# Patient Record
Sex: Female | Born: 1985
Health system: Southern US, Community
[De-identification: ages and names within clinical notes are randomized; demographics above are authoritative.]

## PROBLEM LIST (undated history)

## (undated) DIAGNOSIS — Z8619 Personal history of other infectious and parasitic diseases: Secondary | ICD-10-CM

## (undated) DIAGNOSIS — T7840XA Allergy, unspecified, initial encounter: Secondary | ICD-10-CM

## (undated) DIAGNOSIS — D649 Anemia, unspecified: Secondary | ICD-10-CM

## (undated) DIAGNOSIS — K921 Melena: Secondary | ICD-10-CM

## (undated) DIAGNOSIS — K219 Gastro-esophageal reflux disease without esophagitis: Secondary | ICD-10-CM

## (undated) DIAGNOSIS — J45909 Unspecified asthma, uncomplicated: Secondary | ICD-10-CM

## (undated) HISTORY — DX: Allergy, unspecified, initial encounter: T78.40XA

## (undated) HISTORY — PX: WISDOM TOOTH EXTRACTION: SHX21

## (undated) HISTORY — PX: OTHER SURGICAL HISTORY: SHX169

## (undated) HISTORY — DX: Unspecified asthma, uncomplicated: J45.909

## (undated) HISTORY — DX: Gastro-esophageal reflux disease without esophagitis: K21.9

## (undated) HISTORY — DX: Melena: K92.1

## (undated) HISTORY — DX: Personal history of other infectious and parasitic diseases: Z86.19

## (undated) HISTORY — DX: Anemia, unspecified: D64.9

---

## 2016-02-18 LAB — HM COLONOSCOPY

## 2018-08-12 DIAGNOSIS — Z6824 Body mass index (BMI) 24.0-24.9, adult: Secondary | ICD-10-CM | POA: Diagnosis not present

## 2018-08-12 DIAGNOSIS — Z01419 Encounter for gynecological examination (general) (routine) without abnormal findings: Secondary | ICD-10-CM | POA: Diagnosis not present

## 2018-08-17 LAB — HM PAP SMEAR

## 2018-09-14 DIAGNOSIS — Z23 Encounter for immunization: Secondary | ICD-10-CM | POA: Diagnosis not present

## 2018-10-01 DIAGNOSIS — M9901 Segmental and somatic dysfunction of cervical region: Secondary | ICD-10-CM | POA: Diagnosis not present

## 2018-10-01 DIAGNOSIS — M62838 Other muscle spasm: Secondary | ICD-10-CM | POA: Diagnosis not present

## 2018-10-01 DIAGNOSIS — M9903 Segmental and somatic dysfunction of lumbar region: Secondary | ICD-10-CM | POA: Diagnosis not present

## 2018-10-01 DIAGNOSIS — M461 Sacroiliitis, not elsewhere classified: Secondary | ICD-10-CM | POA: Diagnosis not present

## 2018-10-02 ENCOUNTER — Other Ambulatory Visit: Payer: Self-pay | Admitting: Chiropractic Medicine

## 2018-10-02 ENCOUNTER — Ambulatory Visit
Admission: RE | Admit: 2018-10-02 | Discharge: 2018-10-02 | Disposition: A | Discharge status: Home / Self Care | Payer: BLUE CROSS/BLUE SHIELD | Source: Ambulatory Visit | Attending: Chiropractic Medicine | Admitting: Chiropractic Medicine

## 2018-10-02 DIAGNOSIS — M25561 Pain in right knee: Secondary | ICD-10-CM | POA: Diagnosis not present

## 2018-10-07 DIAGNOSIS — M461 Sacroiliitis, not elsewhere classified: Secondary | ICD-10-CM | POA: Diagnosis not present

## 2018-10-07 DIAGNOSIS — M9903 Segmental and somatic dysfunction of lumbar region: Secondary | ICD-10-CM | POA: Diagnosis not present

## 2018-10-07 DIAGNOSIS — M9901 Segmental and somatic dysfunction of cervical region: Secondary | ICD-10-CM | POA: Diagnosis not present

## 2018-10-07 DIAGNOSIS — M62838 Other muscle spasm: Secondary | ICD-10-CM | POA: Diagnosis not present

## 2018-10-14 DIAGNOSIS — M9903 Segmental and somatic dysfunction of lumbar region: Secondary | ICD-10-CM | POA: Diagnosis not present

## 2018-10-14 DIAGNOSIS — M9901 Segmental and somatic dysfunction of cervical region: Secondary | ICD-10-CM | POA: Diagnosis not present

## 2018-10-14 DIAGNOSIS — M62838 Other muscle spasm: Secondary | ICD-10-CM | POA: Diagnosis not present

## 2018-10-14 DIAGNOSIS — M461 Sacroiliitis, not elsewhere classified: Secondary | ICD-10-CM | POA: Diagnosis not present

## 2018-10-23 DIAGNOSIS — M62838 Other muscle spasm: Secondary | ICD-10-CM | POA: Diagnosis not present

## 2018-10-23 DIAGNOSIS — M9901 Segmental and somatic dysfunction of cervical region: Secondary | ICD-10-CM | POA: Diagnosis not present

## 2018-10-23 DIAGNOSIS — M9903 Segmental and somatic dysfunction of lumbar region: Secondary | ICD-10-CM | POA: Diagnosis not present

## 2018-10-23 DIAGNOSIS — M461 Sacroiliitis, not elsewhere classified: Secondary | ICD-10-CM | POA: Diagnosis not present

## 2018-11-04 DIAGNOSIS — M533 Sacrococcygeal disorders, not elsewhere classified: Secondary | ICD-10-CM | POA: Diagnosis not present

## 2018-11-04 DIAGNOSIS — M6281 Muscle weakness (generalized): Secondary | ICD-10-CM | POA: Diagnosis not present

## 2018-11-04 DIAGNOSIS — N939 Abnormal uterine and vaginal bleeding, unspecified: Secondary | ICD-10-CM | POA: Diagnosis not present

## 2018-11-05 DIAGNOSIS — M9901 Segmental and somatic dysfunction of cervical region: Secondary | ICD-10-CM | POA: Diagnosis not present

## 2018-11-05 DIAGNOSIS — M461 Sacroiliitis, not elsewhere classified: Secondary | ICD-10-CM | POA: Diagnosis not present

## 2018-11-05 DIAGNOSIS — M62838 Other muscle spasm: Secondary | ICD-10-CM | POA: Diagnosis not present

## 2018-11-05 DIAGNOSIS — M9903 Segmental and somatic dysfunction of lumbar region: Secondary | ICD-10-CM | POA: Diagnosis not present

## 2018-11-11 DIAGNOSIS — M6281 Muscle weakness (generalized): Secondary | ICD-10-CM | POA: Diagnosis not present

## 2018-11-11 DIAGNOSIS — M62838 Other muscle spasm: Secondary | ICD-10-CM | POA: Diagnosis not present

## 2018-11-11 DIAGNOSIS — K5900 Constipation, unspecified: Secondary | ICD-10-CM | POA: Diagnosis not present

## 2018-11-11 DIAGNOSIS — M25561 Pain in right knee: Secondary | ICD-10-CM | POA: Diagnosis not present

## 2018-11-27 DIAGNOSIS — M461 Sacroiliitis, not elsewhere classified: Secondary | ICD-10-CM | POA: Diagnosis not present

## 2018-11-27 DIAGNOSIS — M9903 Segmental and somatic dysfunction of lumbar region: Secondary | ICD-10-CM | POA: Diagnosis not present

## 2018-11-27 DIAGNOSIS — M9901 Segmental and somatic dysfunction of cervical region: Secondary | ICD-10-CM | POA: Diagnosis not present

## 2018-11-27 DIAGNOSIS — M62838 Other muscle spasm: Secondary | ICD-10-CM | POA: Diagnosis not present

## 2018-11-30 DIAGNOSIS — M25561 Pain in right knee: Secondary | ICD-10-CM | POA: Diagnosis not present

## 2018-11-30 DIAGNOSIS — M6281 Muscle weakness (generalized): Secondary | ICD-10-CM | POA: Diagnosis not present

## 2018-11-30 DIAGNOSIS — M62838 Other muscle spasm: Secondary | ICD-10-CM | POA: Diagnosis not present

## 2018-11-30 DIAGNOSIS — K5900 Constipation, unspecified: Secondary | ICD-10-CM | POA: Diagnosis not present

## 2018-12-07 DIAGNOSIS — M25561 Pain in right knee: Secondary | ICD-10-CM | POA: Diagnosis not present

## 2018-12-07 DIAGNOSIS — K5900 Constipation, unspecified: Secondary | ICD-10-CM | POA: Diagnosis not present

## 2018-12-07 DIAGNOSIS — M62838 Other muscle spasm: Secondary | ICD-10-CM | POA: Diagnosis not present

## 2018-12-07 DIAGNOSIS — M6281 Muscle weakness (generalized): Secondary | ICD-10-CM | POA: Diagnosis not present

## 2018-12-09 DIAGNOSIS — M9903 Segmental and somatic dysfunction of lumbar region: Secondary | ICD-10-CM | POA: Diagnosis not present

## 2018-12-09 DIAGNOSIS — M62838 Other muscle spasm: Secondary | ICD-10-CM | POA: Diagnosis not present

## 2018-12-09 DIAGNOSIS — M461 Sacroiliitis, not elsewhere classified: Secondary | ICD-10-CM | POA: Diagnosis not present

## 2018-12-09 DIAGNOSIS — M9901 Segmental and somatic dysfunction of cervical region: Secondary | ICD-10-CM | POA: Diagnosis not present

## 2018-12-21 DIAGNOSIS — M25561 Pain in right knee: Secondary | ICD-10-CM | POA: Diagnosis not present

## 2018-12-21 DIAGNOSIS — M6281 Muscle weakness (generalized): Secondary | ICD-10-CM | POA: Diagnosis not present

## 2018-12-21 DIAGNOSIS — K5900 Constipation, unspecified: Secondary | ICD-10-CM | POA: Diagnosis not present

## 2018-12-21 DIAGNOSIS — M62838 Other muscle spasm: Secondary | ICD-10-CM | POA: Diagnosis not present

## 2018-12-23 NOTE — L&D Delivery Note (Signed)
Delivery Note  SVD viable female Apgars 9,9 over 2nd degree ML lac.  Placenta delivered spontaneously intact with 3VC. Repair with 2-0 Chromic with good support and hemostasis noted.  R/V exam confirms.  PH art was not sent.   Mother and baby to couplet care and are doing well.  EBL 150cc  Louretta Shorten, MD

## 2018-12-28 DIAGNOSIS — K5900 Constipation, unspecified: Secondary | ICD-10-CM | POA: Diagnosis not present

## 2018-12-28 DIAGNOSIS — M6281 Muscle weakness (generalized): Secondary | ICD-10-CM | POA: Diagnosis not present

## 2018-12-28 DIAGNOSIS — M62838 Other muscle spasm: Secondary | ICD-10-CM | POA: Diagnosis not present

## 2018-12-28 DIAGNOSIS — M25561 Pain in right knee: Secondary | ICD-10-CM | POA: Diagnosis not present

## 2019-01-04 DIAGNOSIS — M62838 Other muscle spasm: Secondary | ICD-10-CM | POA: Diagnosis not present

## 2019-01-04 DIAGNOSIS — K5900 Constipation, unspecified: Secondary | ICD-10-CM | POA: Diagnosis not present

## 2019-01-04 DIAGNOSIS — M6281 Muscle weakness (generalized): Secondary | ICD-10-CM | POA: Diagnosis not present

## 2019-01-04 DIAGNOSIS — M25561 Pain in right knee: Secondary | ICD-10-CM | POA: Diagnosis not present

## 2019-01-25 DIAGNOSIS — M6281 Muscle weakness (generalized): Secondary | ICD-10-CM | POA: Diagnosis not present

## 2019-01-25 DIAGNOSIS — M25561 Pain in right knee: Secondary | ICD-10-CM | POA: Diagnosis not present

## 2019-01-25 DIAGNOSIS — K5900 Constipation, unspecified: Secondary | ICD-10-CM | POA: Diagnosis not present

## 2019-01-25 DIAGNOSIS — M62838 Other muscle spasm: Secondary | ICD-10-CM | POA: Diagnosis not present

## 2019-01-28 DIAGNOSIS — N911 Secondary amenorrhea: Secondary | ICD-10-CM | POA: Diagnosis not present

## 2019-02-04 DIAGNOSIS — Z3481 Encounter for supervision of other normal pregnancy, first trimester: Secondary | ICD-10-CM | POA: Diagnosis not present

## 2019-02-04 DIAGNOSIS — Z315 Encounter for genetic counseling: Secondary | ICD-10-CM | POA: Diagnosis not present

## 2019-02-04 DIAGNOSIS — Z319 Encounter for procreative management, unspecified: Secondary | ICD-10-CM | POA: Diagnosis not present

## 2019-02-04 DIAGNOSIS — Z3685 Encounter for antenatal screening for Streptococcus B: Secondary | ICD-10-CM | POA: Diagnosis not present

## 2019-02-04 LAB — OB RESULTS CONSOLE GC/CHLAMYDIA
Chlamydia: NEGATIVE
Gonorrhea: NEGATIVE

## 2019-02-04 LAB — OB RESULTS CONSOLE HIV ANTIBODY (ROUTINE TESTING): HIV: NONREACTIVE

## 2019-02-04 LAB — OB RESULTS CONSOLE ANTIBODY SCREEN: Antibody Screen: NEGATIVE

## 2019-02-04 LAB — OB RESULTS CONSOLE HEPATITIS B SURFACE ANTIGEN: Hepatitis B Surface Ag: NEGATIVE

## 2019-02-04 LAB — OB RESULTS CONSOLE RPR: RPR: NONREACTIVE

## 2019-02-04 LAB — OB RESULTS CONSOLE ABO/RH: RH Type: POSITIVE

## 2019-02-04 LAB — OB RESULTS CONSOLE RUBELLA ANTIBODY, IGM: Rubella: NON-IMMUNE/NOT IMMUNE

## 2019-02-06 DIAGNOSIS — J4521 Mild intermittent asthma with (acute) exacerbation: Secondary | ICD-10-CM | POA: Diagnosis not present

## 2019-02-08 DIAGNOSIS — M6281 Muscle weakness (generalized): Secondary | ICD-10-CM | POA: Diagnosis not present

## 2019-02-08 DIAGNOSIS — M25561 Pain in right knee: Secondary | ICD-10-CM | POA: Diagnosis not present

## 2019-02-08 DIAGNOSIS — M62838 Other muscle spasm: Secondary | ICD-10-CM | POA: Diagnosis not present

## 2019-02-08 DIAGNOSIS — K5900 Constipation, unspecified: Secondary | ICD-10-CM | POA: Diagnosis not present

## 2019-02-09 DIAGNOSIS — J189 Pneumonia, unspecified organism: Secondary | ICD-10-CM | POA: Diagnosis not present

## 2019-02-17 ENCOUNTER — Other Ambulatory Visit: Payer: Self-pay

## 2019-02-17 ENCOUNTER — Ambulatory Visit: Payer: BLUE CROSS/BLUE SHIELD | Admitting: Family Medicine

## 2019-02-17 ENCOUNTER — Encounter: Payer: Self-pay | Admitting: Family Medicine

## 2019-02-17 VITALS — BP 110/70 | HR 70 | Temp 98.2°F | Resp 16 | Ht 66.5 in | Wt 150.2 lb

## 2019-02-17 DIAGNOSIS — Z3491 Encounter for supervision of normal pregnancy, unspecified, first trimester: Secondary | ICD-10-CM

## 2019-02-17 DIAGNOSIS — J189 Pneumonia, unspecified organism: Secondary | ICD-10-CM

## 2019-02-17 NOTE — Progress Notes (Signed)
   Subjective:    Patient ID: Lauren Small, female    DOB: 1986/12/22, 33 y.o.   MRN: [id number]  HPI New to establish.  Recently moved to area.  PNA- went to UC on 2/15 and was given Zpack for bronchitis.  No improvement and went to 2nd UC and was dx'd w/ presumptive PNA.  No CXR due to pregnancy.  Currently on Augmentin and Zpack.  Daughter had PNA 3 weeks prior.  Feeling better today.  Continues to have cough.  No fevers.  + intermittent chills.  Some pain w/ cough.  + fatigue.  Pregnancy- following w/ Dr Velvet Bathe.  Due date is 09/16/19   Review of Systems For ROS see HPI     Objective:   Physical Exam Vitals signs reviewed.  Constitutional:      General: She is not in acute distress.    Appearance: She is well-developed.  HENT:     Head: Normocephalic and atraumatic.     Right Ear: Tympanic membrane normal.     Left Ear: Tympanic membrane normal.     Nose: Mucosal edema and rhinorrhea present.     Right Sinus: No maxillary sinus tenderness or frontal sinus tenderness.     Left Sinus: No maxillary sinus tenderness or frontal sinus tenderness.     Mouth/Throat:     Pharynx: Posterior oropharyngeal erythema (w/ PND) present.  Eyes:     Conjunctiva/sclera: Conjunctivae normal.     Pupils: Pupils are equal, round, and reactive to light.  Neck:     Musculoskeletal: Normal range of motion and neck supple.  Cardiovascular:     Rate and Rhythm: Normal rate and regular rhythm.     Heart sounds: Normal heart sounds.  Pulmonary:     Effort: Pulmonary effort is normal. No respiratory distress.     Breath sounds: Normal breath sounds. No wheezing or rales.  Lymphadenopathy:     Cervical: No cervical adenopathy.  Neurological:     General: No focal deficit present.     Mental Status: She is oriented to person, place, and time.  Psychiatric:        Mood and Affect: Mood normal.        Behavior: Behavior normal.        Thought Content: Thought content normal.             Assessment & Plan:  Pneumonia- new.  Pt is finishing abx and improving.  No longer febrile but continues to cough.  Lungs are CTAB.  Told her the cough could persist for weeks and that this is normal.  Continue OTC cough meds, albuterol prn.  Reviewed supportive care and red flags that should prompt return.  Pt expressed understanding and is in agreement w/ plan.   Pregnancy- following w/ Dr Velvet Bathe.  Doing well so far.  Will follow along and treat medical things as they arise.

## 2019-02-17 NOTE — Patient Instructions (Signed)
Schedule your complete physical for after delivery We're here if you need [place] for anything that may come up Continue your allergy meds and antibiotics as directed Cough syrup as needed Call with any questions or concerns Welcome!  We're glad to have you! CONGRATS!!!

## 2019-02-23 DIAGNOSIS — N760 Acute vaginitis: Secondary | ICD-10-CM | POA: Diagnosis not present

## 2019-02-23 DIAGNOSIS — Z113 Encounter for screening for infections with a predominantly sexual mode of transmission: Secondary | ICD-10-CM | POA: Diagnosis not present

## 2019-02-23 DIAGNOSIS — Z3491 Encounter for supervision of normal pregnancy, unspecified, first trimester: Secondary | ICD-10-CM | POA: Diagnosis not present

## 2019-02-24 ENCOUNTER — Ambulatory Visit: Payer: BLUE CROSS/BLUE SHIELD | Admitting: Family Medicine

## 2019-03-01 DIAGNOSIS — M62838 Other muscle spasm: Secondary | ICD-10-CM | POA: Diagnosis not present

## 2019-03-01 DIAGNOSIS — M6281 Muscle weakness (generalized): Secondary | ICD-10-CM | POA: Diagnosis not present

## 2019-03-01 DIAGNOSIS — M25561 Pain in right knee: Secondary | ICD-10-CM | POA: Diagnosis not present

## 2019-03-01 DIAGNOSIS — K5900 Constipation, unspecified: Secondary | ICD-10-CM | POA: Diagnosis not present

## 2019-03-08 DIAGNOSIS — Z3A12 12 weeks gestation of pregnancy: Secondary | ICD-10-CM | POA: Diagnosis not present

## 2019-03-08 DIAGNOSIS — Z3682 Encounter for antenatal screening for nuchal translucency: Secondary | ICD-10-CM | POA: Diagnosis not present

## 2019-03-22 DIAGNOSIS — M25561 Pain in right knee: Secondary | ICD-10-CM | POA: Diagnosis not present

## 2019-03-22 DIAGNOSIS — K5900 Constipation, unspecified: Secondary | ICD-10-CM | POA: Diagnosis not present

## 2019-03-22 DIAGNOSIS — M62838 Other muscle spasm: Secondary | ICD-10-CM | POA: Diagnosis not present

## 2019-03-22 DIAGNOSIS — M6281 Muscle weakness (generalized): Secondary | ICD-10-CM | POA: Diagnosis not present

## 2019-03-30 DIAGNOSIS — K5900 Constipation, unspecified: Secondary | ICD-10-CM | POA: Diagnosis not present

## 2019-03-30 DIAGNOSIS — M62838 Other muscle spasm: Secondary | ICD-10-CM | POA: Diagnosis not present

## 2019-03-30 DIAGNOSIS — M25561 Pain in right knee: Secondary | ICD-10-CM | POA: Diagnosis not present

## 2019-03-30 DIAGNOSIS — M6281 Muscle weakness (generalized): Secondary | ICD-10-CM | POA: Diagnosis not present

## 2019-04-05 DIAGNOSIS — M6281 Muscle weakness (generalized): Secondary | ICD-10-CM | POA: Diagnosis not present

## 2019-04-05 DIAGNOSIS — M25561 Pain in right knee: Secondary | ICD-10-CM | POA: Diagnosis not present

## 2019-04-05 DIAGNOSIS — M62838 Other muscle spasm: Secondary | ICD-10-CM | POA: Diagnosis not present

## 2019-04-05 DIAGNOSIS — K5900 Constipation, unspecified: Secondary | ICD-10-CM | POA: Diagnosis not present

## 2019-04-12 DIAGNOSIS — M62838 Other muscle spasm: Secondary | ICD-10-CM | POA: Diagnosis not present

## 2019-04-12 DIAGNOSIS — K5900 Constipation, unspecified: Secondary | ICD-10-CM | POA: Diagnosis not present

## 2019-04-12 DIAGNOSIS — M25561 Pain in right knee: Secondary | ICD-10-CM | POA: Diagnosis not present

## 2019-04-12 DIAGNOSIS — M6281 Muscle weakness (generalized): Secondary | ICD-10-CM | POA: Diagnosis not present

## 2019-04-26 DIAGNOSIS — Z363 Encounter for antenatal screening for malformations: Secondary | ICD-10-CM | POA: Diagnosis not present

## 2019-04-26 DIAGNOSIS — Z361 Encounter for antenatal screening for raised alphafetoprotein level: Secondary | ICD-10-CM | POA: Diagnosis not present

## 2019-04-26 DIAGNOSIS — Z3A19 19 weeks gestation of pregnancy: Secondary | ICD-10-CM | POA: Diagnosis not present

## 2019-04-26 DIAGNOSIS — M6281 Muscle weakness (generalized): Secondary | ICD-10-CM | POA: Diagnosis not present

## 2019-04-26 DIAGNOSIS — K5900 Constipation, unspecified: Secondary | ICD-10-CM | POA: Diagnosis not present

## 2019-04-26 DIAGNOSIS — M62838 Other muscle spasm: Secondary | ICD-10-CM | POA: Diagnosis not present

## 2019-04-26 DIAGNOSIS — M25561 Pain in right knee: Secondary | ICD-10-CM | POA: Diagnosis not present

## 2019-04-26 DIAGNOSIS — Z3480 Encounter for supervision of other normal pregnancy, unspecified trimester: Secondary | ICD-10-CM | POA: Diagnosis not present

## 2019-04-30 ENCOUNTER — Inpatient Hospital Stay (HOSPITAL_COMMUNITY)
Admission: AD | Admit: 2019-04-30 | Discharge: 2019-04-30 | Disposition: A | Discharge status: Home / Self Care | Payer: BLUE CROSS/BLUE SHIELD | Attending: Obstetrics and Gynecology | Admitting: Obstetrics and Gynecology

## 2019-04-30 ENCOUNTER — Encounter (HOSPITAL_COMMUNITY): Payer: Self-pay | Admitting: *Deleted

## 2019-04-30 ENCOUNTER — Other Ambulatory Visit: Payer: Self-pay

## 2019-04-30 ENCOUNTER — Inpatient Hospital Stay (HOSPITAL_BASED_OUTPATIENT_CLINIC_OR_DEPARTMENT_OTHER): Payer: BLUE CROSS/BLUE SHIELD

## 2019-04-30 DIAGNOSIS — M545 Low back pain: Secondary | ICD-10-CM | POA: Insufficient documentation

## 2019-04-30 DIAGNOSIS — O26899 Other specified pregnancy related conditions, unspecified trimester: Secondary | ICD-10-CM

## 2019-04-30 DIAGNOSIS — Z833 Family history of diabetes mellitus: Secondary | ICD-10-CM | POA: Insufficient documentation

## 2019-04-30 DIAGNOSIS — Z8349 Family history of other endocrine, nutritional and metabolic diseases: Secondary | ICD-10-CM | POA: Insufficient documentation

## 2019-04-30 DIAGNOSIS — Z3A20 20 weeks gestation of pregnancy: Secondary | ICD-10-CM

## 2019-04-30 DIAGNOSIS — O9989 Other specified diseases and conditions complicating pregnancy, childbirth and the puerperium: Secondary | ICD-10-CM | POA: Insufficient documentation

## 2019-04-30 DIAGNOSIS — J45909 Unspecified asthma, uncomplicated: Secondary | ICD-10-CM | POA: Diagnosis not present

## 2019-04-30 DIAGNOSIS — O99512 Diseases of the respiratory system complicating pregnancy, second trimester: Secondary | ICD-10-CM | POA: Insufficient documentation

## 2019-04-30 DIAGNOSIS — R109 Unspecified abdominal pain: Secondary | ICD-10-CM | POA: Diagnosis not present

## 2019-04-30 DIAGNOSIS — Z7951 Long term (current) use of inhaled steroids: Secondary | ICD-10-CM | POA: Diagnosis not present

## 2019-04-30 DIAGNOSIS — Z825 Family history of asthma and other chronic lower respiratory diseases: Secondary | ICD-10-CM | POA: Insufficient documentation

## 2019-04-30 DIAGNOSIS — Z881 Allergy status to other antibiotic agents status: Secondary | ICD-10-CM | POA: Diagnosis not present

## 2019-04-30 DIAGNOSIS — O26892 Other specified pregnancy related conditions, second trimester: Secondary | ICD-10-CM | POA: Diagnosis not present

## 2019-04-30 DIAGNOSIS — O26879 Cervical shortening, unspecified trimester: Secondary | ICD-10-CM

## 2019-04-30 DIAGNOSIS — O26872 Cervical shortening, second trimester: Secondary | ICD-10-CM | POA: Diagnosis not present

## 2019-04-30 LAB — URINALYSIS, ROUTINE W REFLEX MICROSCOPIC
Bilirubin Urine: NEGATIVE
Glucose, UA: NEGATIVE mg/dL
Hgb urine dipstick: NEGATIVE
Ketones, ur: NEGATIVE mg/dL
Leukocytes,Ua: NEGATIVE
Nitrite: NEGATIVE
Protein, ur: NEGATIVE mg/dL
Specific Gravity, Urine: 1.002 — ABNORMAL LOW (ref 1.005–1.030)
pH: 7 (ref 5.0–8.0)

## 2019-04-30 NOTE — Discharge Instructions (Signed)
° °Abdominal Pain During Pregnancy ° °Abdominal pain is common during pregnancy, and has many possible causes. Some causes are more serious than others, and sometimes the cause is not known. Abdominal pain can be a sign that labor is starting. It can also be caused by normal growth and stretching of muscles and ligaments during pregnancy. Always tell your health care provider if you have any abdominal pain. °Follow these instructions at home: °· Do not have sex or put anything in your vagina until your pain goes away completely. °· Get plenty of rest until your pain improves. °· Drink enough fluid to keep your urine pale yellow. °· Take over-the-counter and prescription medicines only as told by your health care provider. °· Keep all follow-up visits as told by your health care provider. This is important. °Contact a health care provider if: °· Your pain continues or gets worse after resting. °· You have lower abdominal pain that: °? Comes and goes at regular intervals. °? Spreads to your back. °? Is similar to menstrual cramps. °· You have pain or burning when you urinate. °Get help right away if: °· You have a fever or chills. °· You have vaginal bleeding. °· You are leaking fluid from your vagina. °· You are passing tissue from your vagina. °· You have vomiting or diarrhea that lasts for more than 24 hours. °· Your baby is moving less than usual. °· You feel very weak or faint. °· You have shortness of breath. °· You develop severe pain in your upper abdomen. °Summary °· Abdominal pain is common during pregnancy, and has many possible causes. °· If you experience abdominal pain during pregnancy, tell your health care provider right away. °· Follow your health care provider's home care instructions and keep all follow-up visits as directed. °This information is not intended to replace advice given to you by your health care provider. Make sure you discuss any questions you have with your health care  provider. °Document Released: 12/09/2005 Document Revised: 03/13/2017 Document Reviewed: 03/13/2017 °Elsevier Interactive Patient Education © 2019 Elsevier Inc. ° °Preterm Labor and Birth Information ° °The normal length of a pregnancy is 39-41 weeks. Preterm labor is when labor starts before 37 completed weeks of pregnancy. °What are the risk factors for preterm labor? °Preterm labor is more likely to occur in women who: °· Have certain infections during pregnancy such as a bladder infection, sexually transmitted infection, or infection inside the uterus (chorioamnionitis). °· Have a shorter-than-normal cervix. °· Have gone into preterm labor before. °· Have had surgery on their cervix. °· Are younger than age 17 or older than age 35. °· Are African American. °· Are pregnant with twins or multiple babies (multiple gestation). °· Take street drugs or smoke while pregnant. °· Do not gain enough weight while pregnant. °· Became pregnant shortly after having been pregnant. °What are the symptoms of preterm labor? °Symptoms of preterm labor include: °· Cramps similar to those that can happen during a menstrual period. The cramps may happen with diarrhea. °· Pain in the abdomen or lower back. °· Regular uterine contractions that may feel like tightening of the abdomen. °· A feeling of increased pressure in the pelvis. °· Increased watery or bloody mucus discharge from the vagina. °· Water breaking (ruptured amniotic sac). °Why is it important to recognize signs of preterm labor? °It is important to recognize signs of preterm labor because babies who are born prematurely may not be fully developed. This can put them at an   increased risk for: °· Long-term (chronic) heart and lung problems. °· Difficulty immediately after birth with regulating body systems, including blood sugar, body temperature, heart rate, and breathing rate. °· Bleeding in the brain. °· Cerebral palsy. °· Learning difficulties. °· Death. °These risks  are highest for babies who are born before 34 weeks of pregnancy. °How is preterm labor treated? °Treatment depends on the length of your pregnancy, your condition, and the health of your baby. It may involve: °· Having a stitch (suture) placed in your cervix to prevent your cervix from opening too early (cerclage). °· Taking or being given medicines, such as: °? Hormone medicines. These may be given early in pregnancy to help support the pregnancy. °? Medicine to stop contractions. °? Medicines to help mature the baby’s lungs. These may be prescribed if the risk of delivery is high. °? Medicines to prevent your baby from developing cerebral palsy. °If the labor happens before 34 weeks of pregnancy, you may need to stay in the hospital. °What should I do if I think I am in preterm labor? °If you think that you are going into preterm labor, call your health care provider right away. °How can I prevent preterm labor in future pregnancies? °To increase your chance of having a full-term pregnancy: °· Do not use any tobacco products, such as cigarettes, chewing tobacco, and e-cigarettes. If you need help quitting, ask your health care provider. °· Do not use street drugs or medicines that have not been prescribed to you during your pregnancy. °· Talk with your health care provider before taking any herbal supplements, even if you have been taking them regularly. °· Make sure you gain a healthy amount of weight during your pregnancy. °· Watch for infection. If you think that you might have an infection, get it checked right away. °· Make sure to tell your health care provider if you have gone into preterm labor before. °This information is not intended to replace advice given to you by your health care provider. Make sure you discuss any questions you have with your health care provider. °Document Released: 02/29/2004 Document Revised: 05/21/2016 Document Reviewed: 05/01/2016 °Elsevier Interactive Patient Education © 2019  Elsevier Inc. °Back Pain in Pregnancy °Back pain during pregnancy is common. Back pain may be caused by several factors that are related to changes during your pregnancy. °Follow these instructions at home: °Managing pain, stiffness, and swelling ° °  ° °· If directed, for sudden (acute) back pain, put ice on the painful area. °? Put ice in a plastic bag. °? Place a towel between your skin and the bag. °? Leave the ice on for 20 minutes, 2-3 times per day. °· If directed, apply heat to the affected area before you exercise. Use the heat source that your health care provider recommends, such as a moist heat pack or a heating pad. °? Place a towel between your skin and the heat source. °? Leave the heat on for 20-30 minutes. °? Remove the heat if your skin turns bright red. This is especially important if you are unable to feel pain, heat, or cold. You may have a greater risk of getting burned. °· If directed, massage the affected area. °Activity °· Exercise as told by your health care provider. Gentle exercise is the best way to prevent or manage back pain. °· Listen to your body when lifting. If lifting hurts, ask for help or bend your knees. This uses your leg muscles instead of your back muscles. °·   Squat down when picking up something from the floor. Do not bend over.  Only use bed rest for short periods as told by your health care provider. Bed rest should only be used for the most severe episodes of back pain. Standing, sitting, and lying down  Do not stand in one place for long periods of time.  Use good posture when sitting. Make sure your head rests over your shoulders and is not hanging forward. Use a pillow on your lower back if necessary.  Try sleeping on your side, preferably the left side, with a pregnancy support pillow or 1-2 regular pillows between your legs. ? If you have back pain after a night's rest, your bed may be too soft. ? A firm mattress may provide more support for your back  during pregnancy. General instructions  Do not wear high heels.  Eat a healthy diet. Try to gain weight within your health care provider's recommendations.  Use a maternity girdle, elastic sling, or back brace as told by your health care provider.  Take over-the-counter and prescription medicines only as told by your health care provider.  Work with a physical therapist or massage therapist to find ways to manage back pain. Acupuncture or massage therapy may be helpful.  Keep all follow-up visits as told by your health care provider. This is important. Contact a health care provider if:  Your back pain interferes with your daily activities.  You have increasing pain in other parts of your body. Get help right away if:  You develop numbness, tingling, weakness, or problems with the use of your arms or legs.  You develop severe back pain that is not controlled with medicine.  You have a change in bowel or bladder control.  You develop shortness of breath, dizziness, or you faint.  You develop nausea, vomiting, or sweating.  You have back pain that is a rhythmic, cramping pain similar to labor pains. Labor pain is usually 1-2 minutes apart, lasts for about 1 minute, and involves a bearing down feeling or pressure in your pelvis.  You have back pain and your water breaks or you have vaginal bleeding.  You have back pain or numbness that travels down your leg.  Your back pain developed after you fell.  You develop pain on one side of your back.  You see blood in your urine.  You develop skin blisters in the area of your back pain. Summary  Back pain may be caused by several factors that are related to changes during your pregnancy.  Follow instructions as told by your health care provider for managing pain, stiffness, and swelling.  Exercise as told by your health care provider. Gentle exercise is the best way to prevent or manage back pain.  Take over-the-counter and  prescription medicines only as told by your health care provider.  Keep all follow-up visits as told by your health care provider. This is important. This information is not intended to replace advice given to you by your health care provider. Make sure you discuss any questions you have with your health care provider. Document Released: 03/19/2006 Document Revised: 05/27/2018 Document Reviewed: 05/27/2018 Elsevier Interactive Patient Education  2019 [employer]

## 2019-04-30 NOTE — MAU Note (Signed)
Pt presents to MAU with complaints of lower abdominal cramping that started last night and she drank water and it got better, started cramping again today. Called office and was told to come in and be evaluated

## 2019-04-30 NOTE — MAU Provider Note (Signed)
History     CSN: [id number]  Arrival date and time: 04/30/19 1238   First Provider Initiated Contact with Patient 04/30/19 1316      Chief Complaint  Patient presents with  . Abdominal Pain   Ms. Lauren Small is a 33 y.o. G2P1001 at 2979w1d who presents to MAU for abdominal cramping/LBP.  Onset: last night after using a TENS machine on glutes/midback/shoulder blades for the first time at home Location: pelvis/lower abdomen, radiating to the low back by wrapping around the sides, pain is bilateral, but concentrated more in LLQ Duration: 1hr last night, started again around 0900 today, constant since that time Character: cramping, "twinges" straight through from pelvis to low back Aggravating/Associated: none/none Relieving: water, left-sided laying Treatment: water/left-sided laying which helped and she was able to sleep through the night without issue Severity: last night 7-8/10, 5/10 currently pt reports sx are improving in MAU  Pt denies VB, LOF, ctx, decreased FM, vaginal discharge/odor/itching. Pt denies N/V, abdominal pain, constipation, diarrhea, or urinary problems. Pt denies fever, chills, fatigue, sweating or changes in appetite. Pt denies SOB or chest pain. Pt denies dizziness, HA, light-headedness, weakness.  Problems this pregnancy include: none. Allergies? erythromycin Current medications/supplements? PNVs, zyrtec, flonase Prenatal care provider? Dr. Elon SpannerLeger @[facility], next appt 05/28/2019   OB History    Gravida  2   Para  1   Term  1   Preterm      AB      Living  1     SAB      TAB      Ectopic      Multiple      Live Births  1           Past Medical History:  Diagnosis Date  . Allergy   . Asthma   . Blood in stool   . GERD (gastroesophageal reflux disease)   . History of chicken pox     Past Surgical History:  Procedure Laterality Date  . urethra polyp      Family History  Problem Relation Age of Onset  .  Thyroid disease Mother   . Early death Father   . Asthma Sister   . Diabetes Sister        type 1  . Healthy Daughter   . Early death Maternal Grandmother   . Heart attack Maternal Grandfather   . Hyperlipidemia Maternal Grandfather   . Hypertension Maternal Grandfather   . Asthma Sister     Social History   Tobacco Use  . Smoking status: Never Smoker  . Smokeless tobacco: Never Used  Substance Use Topics  . Alcohol use: Never    Frequency: Never  . Drug use: Never    Allergies:  Allergies  Allergen Reactions  . Erythromycin Base Rash    rash     Medications Prior to Admission  Medication Sig Dispense Refill Last Dose  . albuterol (PROVENTIL HFA;VENTOLIN HFA) 108 (90 Base) MCG/ACT inhaler Inhale into the lungs every 6 (six) hours as needed for wheezing or shortness of breath.   Past Month at Unknown time  . cetirizine (ZYRTEC ALLERGY) 10 MG tablet Zyrtec 10 mg tablet  Take 1 tablet every day by oral route.   04/30/2019 at Unknown time  . Doxylamine-Pyridoxine 10-10 MG TBEC doxylamine 10 mg-pyridoxine (vit B6) 10 mg tablet,delayed release  TAKE 2 TABLETS BY MOUTH EVERY DAY AS NEEDED   Past Month at Unknown time  . fluticasone (FLONASE) 50 MCG/ACT  nasal spray Place into both nostrils daily.   04/30/2019 at Unknown time  . PRENATAL VIT-FE FUMARATE-FA PO Take by mouth.   04/30/2019 at Unknown time  . amoxicillin-clavulanate (AUGMENTIN) 875-125 MG tablet Take 1 tablet by mouth 2 (two) times daily.   Taking    Review of Systems  Constitutional: Negative for chills, diaphoresis, fatigue and fever.  Respiratory: Negative for shortness of breath.   Cardiovascular: Negative for chest pain.  Gastrointestinal: Negative for abdominal pain, constipation, diarrhea, nausea and vomiting.  Genitourinary: Positive for pelvic pain. Negative for dysuria, flank pain, frequency, urgency, vaginal bleeding, vaginal discharge and vaginal pain.  Musculoskeletal: Positive for back pain.   Neurological: Negative for dizziness, weakness, light-headedness and headaches.   Physical Exam   Blood pressure 103/69, pulse 71, resp. rate 16, last menstrual period 12/10/2018, SpO2 100 %.  Patient Vitals for the past 24 hrs:  BP Pulse Resp SpO2  04/30/19 1439 103/69 71 16 -  04/30/19 1303 - - - 100 %  04/30/19 1259 120/77 75 16 -   Physical Exam  Constitutional: She is oriented to person, place, and time. She appears well-developed and well-nourished. No distress.  HENT:  Head: Normocephalic and atraumatic.  Respiratory: Effort normal.  GI: Soft. She exhibits no distension and no mass. There is no abdominal tenderness. There is no rebound and no guarding.  Genitourinary: There is no rash, tenderness or lesion on the right labia. There is no rash, tenderness or lesion on the left labia. Uterus is not fixed and not tender. Cervix exhibits discharge (mucus-like discharge from os). Cervix exhibits no motion tenderness and no friability.    No vaginal discharge, tenderness or bleeding.  No tenderness or bleeding in the vagina.    Genitourinary Comments: -CE: external os dilated fingertip, unable to reach internal os/midline/soft/~2cm on exm   Neurological: She is alert and oriented to person, place, and time.  Skin: Skin is warm and dry. She is not diaphoretic.  Psychiatric: She has a normal mood and affect. Her behavior is normal. Judgment and thought content normal.   Results for orders placed or performed during the hospital encounter of 04/30/19 (from the past 24 hour(s))  Urinalysis, Routine w reflex microscopic     Status: Abnormal   Collection Time: 04/30/19  1:00 PM  Result Value Ref Range   Color, Urine COLORLESS (A) YELLOW   APPearance CLEAR CLEAR   Specific Gravity, Urine 1.002 (L) 1.005 - 1.030   pH 7.0 5.0 - 8.0   Glucose, UA NEGATIVE NEGATIVE mg/dL   Hgb urine dipstick NEGATIVE NEGATIVE   Bilirubin Urine NEGATIVE NEGATIVE   Ketones, ur NEGATIVE NEGATIVE mg/dL    Protein, ur NEGATIVE NEGATIVE mg/dL   Nitrite NEGATIVE NEGATIVE   Leukocytes,Ua NEGATIVE NEGATIVE   No results found.  MAU Course  Procedures  MDM -r/o PTL -unable to perform fFN d/t GA of [redacted]w[redacted]d -CE: external os dilated fingertip, unable to reach internal os/midline/soft/~2cm on exm -UA: WNL -[place]: CL 3.8cm, all else WNL -CE at end of visit unchanged -pt discharged to home in stable condition  Orders Placed This Encounter  Procedures  . [place] MFM OB LIMITED    Please measure cervical length and note any dilation. Thank you.    Standing Status:   Standing    Number of Occurrences:   1    Order Specific Question:   Symptom/Reason for Exam    Answer:   Cervical shortening [id number]  . Urinalysis, Routine w reflex microscopic  Standing Status:   Standing    Number of Occurrences:   1  . Discharge patient    Order Specific Question:   Discharge disposition    Answer:   01-Home or Self Care [1]    Order Specific Question:   Discharge patient date    Answer:   04/30/2019   No orders of the defined types were placed in this encounter.  Assessment and Plan   1. Cramping affecting pregnancy, antepartum   2. Cervical shortening   3. Low back pain during pregnancy in second trimester   4. [redacted] weeks gestation of pregnancy    Allergies as of 04/30/2019      Reactions   Erythromycin Base Rash   rash      Medication List    TAKE these medications   albuterol 108 (90 Base) MCG/ACT inhaler Commonly known as:  VENTOLIN HFA Inhale into the lungs every 6 (six) hours as needed for wheezing or shortness of breath.   amoxicillin-clavulanate 875-125 MG tablet Commonly known as:  AUGMENTIN Take 1 tablet by mouth 2 (two) times daily.   Doxylamine-Pyridoxine 10-10 MG Tbec doxylamine 10 mg-pyridoxine (vit B6) 10 mg tablet,delayed release  TAKE 2 TABLETS BY MOUTH EVERY DAY AS NEEDED   fluticasone 50 MCG/ACT nasal spray Commonly known as:  FLONASE Place into both nostrils daily.    PRENATAL VIT-FE FUMARATE-FA PO Take by mouth.   ZyrTEC Allergy 10 MG tablet Generic drug:  cetirizine Zyrtec 10 mg tablet  Take 1 tablet every day by oral route.      -discussed current viability status -discussed appropriate hydration in pregnancy -discussed s/sx of UTI -pt advised not to use TENS machine during pregnancy -strict PTL/return MAU precautions given -pt questions asked and answered -pt discharged to home in stable condition  Joni Reining E Issacc Merlo 04/30/2019, 2:43 PM

## 2019-05-05 DIAGNOSIS — O26892 Other specified pregnancy related conditions, second trimester: Secondary | ICD-10-CM | POA: Diagnosis not present

## 2019-05-05 DIAGNOSIS — Z3A20 20 weeks gestation of pregnancy: Secondary | ICD-10-CM | POA: Diagnosis not present

## 2019-05-12 DIAGNOSIS — K5900 Constipation, unspecified: Secondary | ICD-10-CM | POA: Diagnosis not present

## 2019-05-12 DIAGNOSIS — M25561 Pain in right knee: Secondary | ICD-10-CM | POA: Diagnosis not present

## 2019-05-12 DIAGNOSIS — M6281 Muscle weakness (generalized): Secondary | ICD-10-CM | POA: Diagnosis not present

## 2019-05-12 DIAGNOSIS — M62838 Other muscle spasm: Secondary | ICD-10-CM | POA: Diagnosis not present

## 2019-05-19 DIAGNOSIS — M25561 Pain in right knee: Secondary | ICD-10-CM | POA: Diagnosis not present

## 2019-05-19 DIAGNOSIS — M62838 Other muscle spasm: Secondary | ICD-10-CM | POA: Diagnosis not present

## 2019-05-19 DIAGNOSIS — K5900 Constipation, unspecified: Secondary | ICD-10-CM | POA: Diagnosis not present

## 2019-05-19 DIAGNOSIS — M6281 Muscle weakness (generalized): Secondary | ICD-10-CM | POA: Diagnosis not present

## 2019-05-25 DIAGNOSIS — M62838 Other muscle spasm: Secondary | ICD-10-CM | POA: Diagnosis not present

## 2019-05-25 DIAGNOSIS — M6281 Muscle weakness (generalized): Secondary | ICD-10-CM | POA: Diagnosis not present

## 2019-05-25 DIAGNOSIS — M25561 Pain in right knee: Secondary | ICD-10-CM | POA: Diagnosis not present

## 2019-05-25 DIAGNOSIS — K5900 Constipation, unspecified: Secondary | ICD-10-CM | POA: Diagnosis not present

## 2019-06-01 DIAGNOSIS — M62838 Other muscle spasm: Secondary | ICD-10-CM | POA: Diagnosis not present

## 2019-06-01 DIAGNOSIS — K5900 Constipation, unspecified: Secondary | ICD-10-CM | POA: Diagnosis not present

## 2019-06-01 DIAGNOSIS — M25561 Pain in right knee: Secondary | ICD-10-CM | POA: Diagnosis not present

## 2019-06-01 DIAGNOSIS — M6281 Muscle weakness (generalized): Secondary | ICD-10-CM | POA: Diagnosis not present

## 2019-06-07 DIAGNOSIS — K5900 Constipation, unspecified: Secondary | ICD-10-CM | POA: Diagnosis not present

## 2019-06-07 DIAGNOSIS — M62838 Other muscle spasm: Secondary | ICD-10-CM | POA: Diagnosis not present

## 2019-06-07 DIAGNOSIS — M6281 Muscle weakness (generalized): Secondary | ICD-10-CM | POA: Diagnosis not present

## 2019-06-07 DIAGNOSIS — M25561 Pain in right knee: Secondary | ICD-10-CM | POA: Diagnosis not present

## 2019-06-14 DIAGNOSIS — Z20828 Contact with and (suspected) exposure to other viral communicable diseases: Secondary | ICD-10-CM | POA: Diagnosis not present

## 2019-06-15 DIAGNOSIS — M6281 Muscle weakness (generalized): Secondary | ICD-10-CM | POA: Diagnosis not present

## 2019-06-15 DIAGNOSIS — M62838 Other muscle spasm: Secondary | ICD-10-CM | POA: Diagnosis not present

## 2019-06-15 DIAGNOSIS — K5900 Constipation, unspecified: Secondary | ICD-10-CM | POA: Diagnosis not present

## 2019-06-15 DIAGNOSIS — M25561 Pain in right knee: Secondary | ICD-10-CM | POA: Diagnosis not present

## 2019-06-22 DIAGNOSIS — M6281 Muscle weakness (generalized): Secondary | ICD-10-CM | POA: Diagnosis not present

## 2019-06-22 DIAGNOSIS — M25561 Pain in right knee: Secondary | ICD-10-CM | POA: Diagnosis not present

## 2019-06-22 DIAGNOSIS — K5900 Constipation, unspecified: Secondary | ICD-10-CM | POA: Diagnosis not present

## 2019-06-22 DIAGNOSIS — M62838 Other muscle spasm: Secondary | ICD-10-CM | POA: Diagnosis not present

## 2019-06-23 DIAGNOSIS — Z23 Encounter for immunization: Secondary | ICD-10-CM | POA: Diagnosis not present

## 2019-06-23 DIAGNOSIS — Z361 Encounter for antenatal screening for raised alphafetoprotein level: Secondary | ICD-10-CM | POA: Diagnosis not present

## 2019-06-23 DIAGNOSIS — Z3480 Encounter for supervision of other normal pregnancy, unspecified trimester: Secondary | ICD-10-CM | POA: Diagnosis not present

## 2019-06-28 DIAGNOSIS — M25561 Pain in right knee: Secondary | ICD-10-CM | POA: Diagnosis not present

## 2019-06-28 DIAGNOSIS — M62838 Other muscle spasm: Secondary | ICD-10-CM | POA: Diagnosis not present

## 2019-06-28 DIAGNOSIS — K5900 Constipation, unspecified: Secondary | ICD-10-CM | POA: Diagnosis not present

## 2019-06-28 DIAGNOSIS — M6281 Muscle weakness (generalized): Secondary | ICD-10-CM | POA: Diagnosis not present

## 2019-07-05 DIAGNOSIS — M62838 Other muscle spasm: Secondary | ICD-10-CM | POA: Diagnosis not present

## 2019-07-05 DIAGNOSIS — M6281 Muscle weakness (generalized): Secondary | ICD-10-CM | POA: Diagnosis not present

## 2019-07-05 DIAGNOSIS — K5900 Constipation, unspecified: Secondary | ICD-10-CM | POA: Diagnosis not present

## 2019-07-05 DIAGNOSIS — M25561 Pain in right knee: Secondary | ICD-10-CM | POA: Diagnosis not present

## 2019-07-12 DIAGNOSIS — K5900 Constipation, unspecified: Secondary | ICD-10-CM | POA: Diagnosis not present

## 2019-07-12 DIAGNOSIS — M25561 Pain in right knee: Secondary | ICD-10-CM | POA: Diagnosis not present

## 2019-07-12 DIAGNOSIS — M62838 Other muscle spasm: Secondary | ICD-10-CM | POA: Diagnosis not present

## 2019-07-12 DIAGNOSIS — M6281 Muscle weakness (generalized): Secondary | ICD-10-CM | POA: Diagnosis not present

## 2019-07-19 DIAGNOSIS — M6281 Muscle weakness (generalized): Secondary | ICD-10-CM | POA: Diagnosis not present

## 2019-07-19 DIAGNOSIS — K5900 Constipation, unspecified: Secondary | ICD-10-CM | POA: Diagnosis not present

## 2019-07-19 DIAGNOSIS — M25561 Pain in right knee: Secondary | ICD-10-CM | POA: Diagnosis not present

## 2019-07-19 DIAGNOSIS — M62838 Other muscle spasm: Secondary | ICD-10-CM | POA: Diagnosis not present

## 2019-07-26 DIAGNOSIS — M62838 Other muscle spasm: Secondary | ICD-10-CM | POA: Diagnosis not present

## 2019-07-26 DIAGNOSIS — M25561 Pain in right knee: Secondary | ICD-10-CM | POA: Diagnosis not present

## 2019-07-26 DIAGNOSIS — M6281 Muscle weakness (generalized): Secondary | ICD-10-CM | POA: Diagnosis not present

## 2019-07-26 DIAGNOSIS — K5900 Constipation, unspecified: Secondary | ICD-10-CM | POA: Diagnosis not present

## 2019-08-02 DIAGNOSIS — M62838 Other muscle spasm: Secondary | ICD-10-CM | POA: Diagnosis not present

## 2019-08-02 DIAGNOSIS — M6281 Muscle weakness (generalized): Secondary | ICD-10-CM | POA: Diagnosis not present

## 2019-08-02 DIAGNOSIS — M25561 Pain in right knee: Secondary | ICD-10-CM | POA: Diagnosis not present

## 2019-08-02 DIAGNOSIS — K5900 Constipation, unspecified: Secondary | ICD-10-CM | POA: Diagnosis not present

## 2019-08-09 DIAGNOSIS — M25561 Pain in right knee: Secondary | ICD-10-CM | POA: Diagnosis not present

## 2019-08-09 DIAGNOSIS — M6281 Muscle weakness (generalized): Secondary | ICD-10-CM | POA: Diagnosis not present

## 2019-08-09 DIAGNOSIS — M62838 Other muscle spasm: Secondary | ICD-10-CM | POA: Diagnosis not present

## 2019-08-09 DIAGNOSIS — K5900 Constipation, unspecified: Secondary | ICD-10-CM | POA: Diagnosis not present

## 2019-08-18 DIAGNOSIS — Z3685 Encounter for antenatal screening for Streptococcus B: Secondary | ICD-10-CM | POA: Diagnosis not present

## 2019-08-18 LAB — OB RESULTS CONSOLE GBS: GBS: NEGATIVE

## 2019-08-27 DIAGNOSIS — Z23 Encounter for immunization: Secondary | ICD-10-CM | POA: Diagnosis not present

## 2019-09-02 ENCOUNTER — Telehealth (HOSPITAL_COMMUNITY): Payer: Self-pay | Admitting: *Deleted

## 2019-09-02 ENCOUNTER — Encounter (HOSPITAL_COMMUNITY): Payer: Self-pay | Admitting: *Deleted

## 2019-09-02 NOTE — Telephone Encounter (Signed)
Preadmission screen  

## 2019-09-13 ENCOUNTER — Other Ambulatory Visit (HOSPITAL_COMMUNITY)
Admission: RE | Admit: 2019-09-13 | Discharge: 2019-09-13 | Disposition: A | Discharge status: Home / Self Care | Payer: BC Managed Care – PPO | Source: Ambulatory Visit | Attending: Obstetrics & Gynecology | Admitting: Obstetrics & Gynecology

## 2019-09-13 ENCOUNTER — Other Ambulatory Visit: Payer: Self-pay

## 2019-09-13 DIAGNOSIS — Z20828 Contact with and (suspected) exposure to other viral communicable diseases: Secondary | ICD-10-CM | POA: Insufficient documentation

## 2019-09-13 DIAGNOSIS — Z01812 Encounter for preprocedural laboratory examination: Secondary | ICD-10-CM | POA: Diagnosis not present

## 2019-09-13 LAB — SARS CORONAVIRUS 2 BY RT PCR (HOSPITAL ORDER, PERFORMED IN ~~LOC~~ HOSPITAL LAB): SARS Coronavirus 2: NEGATIVE

## 2019-09-13 NOTE — H&P (Signed)
Lauren Small is a 33 y.o. female presenting for IOL. Prior SVD in 2016 c/b persistent sacral pain. Panorama rr female "[employer]".    OB History    Gravida  2   Para  1   Term  1   Preterm      AB      Living  1     SAB      TAB      Ectopic      Multiple      Live Births  1          Past Medical History:  Diagnosis Date  . Allergy   . Asthma   . Blood in stool   . GERD (gastroesophageal reflux disease)   . History of chicken pox    Past Surgical History:  Procedure Laterality Date  . urethra polyp    . WISDOM TOOTH EXTRACTION     Family History: family history includes Asthma in her sister and sister; Diabetes in her sister; Early death in her father and maternal grandmother; Healthy in her daughter; Heart attack in her maternal grandfather; Hyperlipidemia in her maternal grandfather; Hypertension in her maternal grandfather; Thyroid disease in her mother. Social History:  reports that she has never smoked. She has never used smokeless tobacco. She reports that she does not drink alcohol or use drugs.     Maternal Diabetes: No Genetic Screening: Normal Maternal Ultrasounds/Referrals: Normal Fetal Ultrasounds or other Referrals:  None Maternal Substance Abuse:  No Significant Maternal Medications:  None Significant Maternal Lab Results:  None Other Comments:  None  ROS History   Last menstrual period 12/10/2018. Exam Physical Exam  (from office) NAD, A&O NWOB Abd soft, nondistended, gravid   Prenatal labs: ABO, Rh: O/Positive/-- (02/13 0000) Antibody: Negative (02/13 0000) Rubella: Nonimmune (02/13 0000) RPR: Nonreactive (02/13 0000)  HBsAg: Negative (02/13 0000)  HIV: Non-reactive (02/13 0000)  GBS: Negative/-- (08/26 0000)   Assessment/Plan: 33 yo G2P1001 @ 39.6 wga presenting for IOL s/s dates. Cervix unfavorable. Plan for cytotec followed by pitocin/AROM when more favorable.  Rubella nonimmune - plan MMR PP GBS  negative.   Tyson Dense 09/13/2019, 1:38 PM

## 2019-09-13 NOTE — MAU Note (Signed)
Covid swab collected. PT tolerated well. PT asymptomatic 

## 2019-09-15 ENCOUNTER — Encounter (HOSPITAL_COMMUNITY): Payer: Self-pay

## 2019-09-15 ENCOUNTER — Inpatient Hospital Stay (HOSPITAL_COMMUNITY)
Admission: AD | Admit: 2019-09-15 | Discharge: 2019-09-16 | DRG: 807 | Disposition: A | Discharge status: Home / Self Care | Payer: BC Managed Care – PPO | Attending: Obstetrics and Gynecology | Admitting: Obstetrics and Gynecology

## 2019-09-15 ENCOUNTER — Inpatient Hospital Stay (HOSPITAL_COMMUNITY): Payer: BC Managed Care – PPO | Admitting: Anesthesiology

## 2019-09-15 ENCOUNTER — Other Ambulatory Visit: Payer: Self-pay

## 2019-09-15 ENCOUNTER — Inpatient Hospital Stay (HOSPITAL_COMMUNITY): Payer: BC Managed Care – PPO

## 2019-09-15 DIAGNOSIS — Z3A39 39 weeks gestation of pregnancy: Secondary | ICD-10-CM

## 2019-09-15 DIAGNOSIS — Z3490 Encounter for supervision of normal pregnancy, unspecified, unspecified trimester: Secondary | ICD-10-CM

## 2019-09-15 DIAGNOSIS — J45909 Unspecified asthma, uncomplicated: Secondary | ICD-10-CM | POA: Diagnosis not present

## 2019-09-15 DIAGNOSIS — Z23 Encounter for immunization: Secondary | ICD-10-CM | POA: Diagnosis not present

## 2019-09-15 DIAGNOSIS — O26893 Other specified pregnancy related conditions, third trimester: Secondary | ICD-10-CM | POA: Diagnosis not present

## 2019-09-15 DIAGNOSIS — O9952 Diseases of the respiratory system complicating childbirth: Secondary | ICD-10-CM | POA: Diagnosis not present

## 2019-09-15 LAB — ABO/RH: ABO/RH(D): O POS

## 2019-09-15 LAB — CBC
HCT: 33.4 % — ABNORMAL LOW (ref 36.0–46.0)
Hemoglobin: 11.2 g/dL — ABNORMAL LOW (ref 12.0–15.0)
MCH: 32.5 pg (ref 26.0–34.0)
MCHC: 33.5 g/dL (ref 30.0–36.0)
MCV: 96.8 fL (ref 80.0–100.0)
Platelets: 274 10*3/uL (ref 150–400)
RBC: 3.45 MIL/uL — ABNORMAL LOW (ref 3.87–5.11)
RDW: 13.2 % (ref 11.5–15.5)
WBC: 8.5 10*3/uL (ref 4.0–10.5)
nRBC: 0 % (ref 0.0–0.2)

## 2019-09-15 LAB — RPR: RPR Ser Ql: NONREACTIVE

## 2019-09-15 LAB — TYPE AND SCREEN
ABO/RH(D): O POS
Antibody Screen: NEGATIVE

## 2019-09-15 MED ORDER — MISOPROSTOL 25 MCG QUARTER TABLET
25.0000 ug | ORAL_TABLET | ORAL | Status: DC | PRN
  Administered 2019-09-15: 25 ug via VAGINAL
  Filled 2019-09-15: qty 1

## 2019-09-15 MED ORDER — DIBUCAINE (PERIANAL) 1 % EX OINT: 1.0000 "application " | TOPICAL_OINTMENT | CUTANEOUS | Status: DC | PRN

## 2019-09-15 MED ORDER — LIDOCAINE HCL (PF) 1 % IJ SOLN
INTRAMUSCULAR | Status: DC | PRN
  Administered 2019-09-15: 10 mL via EPIDURAL

## 2019-09-15 MED ORDER — SIMETHICONE 80 MG PO CHEW: 80.0000 mg | CHEWABLE_TABLET | ORAL | Status: DC | PRN

## 2019-09-15 MED ORDER — OXYCODONE-ACETAMINOPHEN 5-325 MG PO TABS: 1.0000 | ORAL_TABLET | ORAL | Status: DC | PRN

## 2019-09-15 MED ORDER — OXYCODONE-ACETAMINOPHEN 5-325 MG PO TABS: 2.0000 | ORAL_TABLET | ORAL | Status: DC | PRN

## 2019-09-15 MED ORDER — ZOLPIDEM TARTRATE 5 MG PO TABS: 5.0000 mg | ORAL_TABLET | Freq: Every evening | ORAL | Status: DC | PRN

## 2019-09-15 MED ORDER — OXYTOCIN BOLUS FROM INFUSION
500.0000 mL | Freq: Once | INTRAVENOUS | Status: AC
  Administered 2019-09-15: 500 mL via INTRAVENOUS

## 2019-09-15 MED ORDER — IBUPROFEN 600 MG PO TABS
600.0000 mg | ORAL_TABLET | Freq: Four times a day (QID) | ORAL | Status: DC
  Administered 2019-09-15 – 2019-09-16 (×5): 600 mg via ORAL
  Filled 2019-09-15 (×5): qty 1

## 2019-09-15 MED ORDER — SODIUM CHLORIDE (PF) 0.9 % IJ SOLN
INTRAMUSCULAR | Status: DC | PRN
  Administered 2019-09-15: 12 mL/h via EPIDURAL

## 2019-09-15 MED ORDER — OXYTOCIN 40 UNITS IN NORMAL SALINE INFUSION - SIMPLE MED: 1.0000 m[IU]/min | INTRAVENOUS | Status: DC

## 2019-09-15 MED ORDER — SOD CITRATE-CITRIC ACID 500-334 MG/5ML PO SOLN
30.0000 mL | ORAL | Status: DC | PRN
  Administered 2019-09-15: 30 mL via ORAL
  Filled 2019-09-15: qty 30

## 2019-09-15 MED ORDER — TETANUS-DIPHTH-ACELL PERTUSSIS 5-2.5-18.5 LF-MCG/0.5 IM SUSP: 0.5000 mL | Freq: Once | INTRAMUSCULAR | Status: DC

## 2019-09-15 MED ORDER — HYDROXYZINE HCL 50 MG PO TABS: 50.0000 mg | ORAL_TABLET | Freq: Four times a day (QID) | ORAL | Status: DC | PRN

## 2019-09-15 MED ORDER — ONDANSETRON HCL 4 MG/2ML IJ SOLN: 4.0000 mg | INTRAMUSCULAR | Status: DC | PRN

## 2019-09-15 MED ORDER — ONDANSETRON HCL 4 MG/2ML IJ SOLN: 4.0000 mg | Freq: Four times a day (QID) | INTRAMUSCULAR | Status: DC | PRN

## 2019-09-15 MED ORDER — PRENATAL MULTIVITAMIN CH
1.0000 | ORAL_TABLET | Freq: Every day | ORAL | Status: DC
  Administered 2019-09-15: 1 via ORAL
  Filled 2019-09-15: qty 1

## 2019-09-15 MED ORDER — FENTANYL-BUPIVACAINE-NACL 0.5-0.125-0.9 MG/250ML-% EP SOLN
EPIDURAL | Status: AC
  Filled 2019-09-15: qty 250

## 2019-09-15 MED ORDER — MEASLES, MUMPS & RUBELLA VAC IJ SOLR: 0.5000 mL | Freq: Once | INTRAMUSCULAR | Status: DC

## 2019-09-15 MED ORDER — DIPHENHYDRAMINE HCL 25 MG PO CAPS: 25.0000 mg | ORAL_CAPSULE | Freq: Four times a day (QID) | ORAL | Status: DC | PRN

## 2019-09-15 MED ORDER — SENNOSIDES-DOCUSATE SODIUM 8.6-50 MG PO TABS
2.0000 | ORAL_TABLET | ORAL | Status: DC
  Administered 2019-09-15: 2 via ORAL
  Filled 2019-09-15: qty 2

## 2019-09-15 MED ORDER — ONDANSETRON HCL 4 MG PO TABS: 4.0000 mg | ORAL_TABLET | ORAL | Status: DC | PRN

## 2019-09-15 MED ORDER — LACTATED RINGERS IV SOLN
INTRAVENOUS | Status: DC
  Administered 2019-09-15: 01:00:00 via INTRAVENOUS

## 2019-09-15 MED ORDER — TERBUTALINE SULFATE 1 MG/ML IJ SOLN: 0.2500 mg | Freq: Once | INTRAMUSCULAR | Status: DC | PRN

## 2019-09-15 MED ORDER — ACETAMINOPHEN 325 MG PO TABS: 650.0000 mg | ORAL_TABLET | ORAL | Status: DC | PRN

## 2019-09-15 MED ORDER — WITCH HAZEL-GLYCERIN EX PADS: 1.0000 "application " | MEDICATED_PAD | CUTANEOUS | Status: DC | PRN

## 2019-09-15 MED ORDER — OXYTOCIN 40 UNITS IN NORMAL SALINE INFUSION - SIMPLE MED
2.5000 [IU]/h | INTRAVENOUS | Status: DC
  Filled 2019-09-15: qty 1000

## 2019-09-15 MED ORDER — MEDROXYPROGESTERONE ACETATE 150 MG/ML IM SUSP: 150.0000 mg | INTRAMUSCULAR | Status: DC | PRN

## 2019-09-15 MED ORDER — BENZOCAINE-MENTHOL 20-0.5 % EX AERO
1.0000 "application " | INHALATION_SPRAY | CUTANEOUS | Status: DC | PRN
  Filled 2019-09-15: qty 56

## 2019-09-15 MED ORDER — ACETAMINOPHEN 325 MG PO TABS
650.0000 mg | ORAL_TABLET | ORAL | Status: DC | PRN
  Administered 2019-09-15 – 2019-09-16 (×3): 650 mg via ORAL
  Filled 2019-09-15 (×3): qty 2

## 2019-09-15 MED ORDER — LACTATED RINGERS IV SOLN
500.0000 mL | INTRAVENOUS | Status: DC | PRN
  Administered 2019-09-15: 500 mL via INTRAVENOUS

## 2019-09-15 MED ORDER — COCONUT OIL OIL
1.0000 "application " | TOPICAL_OIL | Status: DC | PRN
  Administered 2019-09-15: 1 via TOPICAL

## 2019-09-15 MED ORDER — LIDOCAINE HCL (PF) 1 % IJ SOLN: 30.0000 mL | INTRAMUSCULAR | Status: DC | PRN

## 2019-09-15 MED ORDER — BUTORPHANOL TARTRATE 1 MG/ML IJ SOLN: 1.0000 mg | INTRAMUSCULAR | Status: DC | PRN

## 2019-09-15 NOTE — Anesthesia Postprocedure Evaluation (Signed)
Anesthesia Post Note  Patient: Lauren Small  Procedure(s) Performed: AN AD HOC LABOR EPIDURAL     Patient location during evaluation: Mother Baby Anesthesia Type: Epidural Level of consciousness: awake and alert and oriented Pain management: satisfactory to patient Vital Signs Assessment: post-procedure vital signs reviewed and stable Respiratory status: respiratory function stable Cardiovascular status: stable Postop Assessment: no headache, no backache, epidural receding, patient able to bend at knees, no signs of nausea or vomiting and adequate PO intake Anesthetic complications: no    Last Vitals:  Vitals:   09/15/19 0915 09/15/19 1030  BP: 105/65 105/71  Pulse: 65 70  Resp: 20 18  Temp: 36.7 C 36.7 C  SpO2: 100%     Last Pain:  Vitals:   09/15/19 1030  TempSrc: Oral  PainSc: 2    Pain Goal:                   [name]

## 2019-09-15 NOTE — Progress Notes (Signed)
Post Partum Day 0 - midnight IOL that delivered at 6am Subjective: no complaints  Objective: Blood pressure 110/68, pulse 64, temperature (!) 97.5 F (36.4 C), temperature source Oral, resp. rate 16, height 5\' 7"  (1.702 m), weight 88.5 kg, last menstrual period 12/10/2018, SpO2 99 %.  Physical Exam:  General: alert, cooperative and appears stated age Lochia: appropriate Uterine Fundus: firm Incision: healing well, no significant drainage, no dehiscence, no significant erythema DVT Evaluation: No evidence of DVT seen on physical exam. Negative Homan's sign. No cords or calf tenderness. No significant calf/ankle edema.  Recent Labs    09/15/19 0056  HGB 11.2*  HCT 33.4*    Assessment/Plan: Plan for discharge tomorrow   LOS: 0 days   Tyson Dense 09/15/2019, 8:31 AM

## 2019-09-15 NOTE — Anesthesia Procedure Notes (Signed)
Epidural Patient location during procedure: OB Start time: 09/15/2019 4:51 AM End time: 09/15/2019 5:01 AM  Staffing Anesthesiologist: Lidia Collum, MD Performed: anesthesiologist   Preanesthetic Checklist Completed: patient identified, pre-op evaluation, timeout performed, IV checked, risks and benefits discussed and monitors and equipment checked  Epidural Patient position: sitting Prep: DuraPrep Patient monitoring: heart rate, continuous pulse ox and blood pressure Approach: midline Location: L3-L4 Injection technique: LOR air  Needle:  Needle type: Tuohy  Needle gauge: 17 G Needle length: 9 cm Needle insertion depth: 5 cm Catheter type: closed end flexible Catheter size: 19 Gauge Catheter at skin depth: 10 cm Test dose: negative  Assessment Events: blood not aspirated, injection not painful, no injection resistance, negative IV test and no paresthesia  Additional Notes Reason for block:procedure for pain

## 2019-09-15 NOTE — Anesthesia Preprocedure Evaluation (Signed)
Anesthesia Evaluation  Patient identified by MRN, date of birth, ID band Patient awake    Reviewed: Allergy & Precautions, H&P , NPO status , Patient's Chart, lab work & pertinent test results  History of Anesthesia Complications Negative for: history of anesthetic complications  Airway Mallampati: II  TM Distance: >3 FB Neck ROM: full    Dental no notable dental hx.    Pulmonary asthma (allergy induced) ,    Pulmonary exam normal        Cardiovascular negative cardio ROS Normal cardiovascular exam Rhythm:regular Rate:Normal     Neuro/Psych negative neurological ROS  negative psych ROS   GI/Hepatic Neg liver ROS, GERD  ,  Endo/Other  negative endocrine ROS  Renal/GU negative Renal ROS  negative genitourinary   Musculoskeletal   Abdominal   Peds  Hematology negative hematology ROS (+)   Anesthesia Other Findings   Reproductive/Obstetrics (+) Pregnancy                             Anesthesia Physical Anesthesia Plan  ASA: II  Anesthesia Plan: Epidural   Post-op Pain Management:    Induction:   PONV Risk Score and Plan:   Airway Management Planned:   Additional Equipment:   Intra-op Plan:   Post-operative Plan:   Informed Consent: I have reviewed the patients History and Physical, chart, labs and discussed the procedure including the risks, benefits and alternatives for the proposed anesthesia with the patient or authorized representative who has indicated his/her understanding and acceptance.       Plan Discussed with:   Anesthesia Plan Comments:         Anesthesia Quick Evaluation

## 2019-09-15 NOTE — Lactation Note (Signed)
This note was copied from a baby's chart. Lactation Consultation Note  Patient Name: Lauren Small [id number] Date: 09/15/2019 Reason for consult: Initial assessment;Difficult latch;Nipple pain/trauma;Term  LC in to visit with P2 Mom of term baby at 76 hrs old.  Baby has breastfed 3 times since birth.   Mom has a history of painful latches with first baby (34 yrs old) and baby had oral restrictions and laser surgery on frenulum.   The latch with this baby is very painful, nipples reddened and pinched when baby comes off.  No lingual frenulum noted, palate normal arch, but there is a labial frenulum noted.    Assisted with latch in football and cross cradle hold.  Baby latches but Mom in a lot of discomfort, and nipple pinched when baby comes off.  Initiated a 24 mm nipple shield, with instructions on how to apply.  Baby able to attain a deeper latch to breast with the nipple shield.  Colostrum noted in shield after baby fed for 15 mins.  Mom still feeling some pain, but improved.  Coconut oil given with instructions on how to use.  DEBP set up and Mom instructed to pump after every other feeding to support a full milk supply.   Baby to be fed any colostrum by spoon or curved tip syringe in nipple shield.  Lactation brochure left with Mom.  Mom aware of IP and OP lactation support available. Encouraged to call.  Maternal Data Formula Feeding for Exclusion: No Has patient been taught Hand Expression?: Yes Does the patient have breastfeeding experience prior to this delivery?: Yes  Feeding Feeding Type: Breast Fed  LATCH Score Latch: Grasps breast easily, tongue down, lips flanged, rhythmical sucking.  Audible Swallowing: A few with stimulation  Type of Nipple: Everted at rest and after stimulation  Comfort (Breast/Nipple): Filling, red/small blisters or bruises, mild/mod discomfort  Hold (Positioning): Assistance needed to correctly position infant at breast and maintain  latch.  LATCH Score: 7  Interventions Interventions: Breast feeding basics reviewed;Assisted with latch;Skin to skin;Breast massage;Hand express;Breast compression;Adjust position;Support pillows;Position options;Expressed milk;Coconut oil;DEBP  Lactation Tools Discussed/Used Tools: Nipple Shields;Pump;Coconut oil Nipple shield size: 24 Breast pump type: Double-Electric Breast Pump WIC Program: No   Consult Status Consult Status: Follow-up Date: 09/16/19 Follow-up type: In-patient    Broadus John 09/15/2019, 5:11 PM

## 2019-09-16 LAB — BIRTH TISSUE RECOVERY COLLECTION (PLACENTA DONATION)

## 2019-09-16 LAB — CBC
HCT: 30.5 % — ABNORMAL LOW (ref 36.0–46.0)
Hemoglobin: 10.3 g/dL — ABNORMAL LOW (ref 12.0–15.0)
MCH: 32.8 pg (ref 26.0–34.0)
MCHC: 33.8 g/dL (ref 30.0–36.0)
MCV: 97.1 fL (ref 80.0–100.0)
Platelets: 203 10*3/uL (ref 150–400)
RBC: 3.14 MIL/uL — ABNORMAL LOW (ref 3.87–5.11)
RDW: 13.5 % (ref 11.5–15.5)
WBC: 8.2 10*3/uL (ref 4.0–10.5)
nRBC: 0 % (ref 0.0–0.2)

## 2019-09-16 NOTE — Discharge Summary (Signed)
Obstetric Discharge Summary Reason for Admission: induction of labor Prenatal Procedures: none Intrapartum Procedures: spontaneous vaginal delivery Postpartum Procedures: none Complications-Operative and Postpartum: 2nd degree perineal laceration Hemoglobin  Date Value Ref Range Status  09/16/2019 10.3 (L) 12.0 - 15.0 g/dL Final   HCT  Date Value Ref Range Status  09/16/2019 30.5 (L) 36.0 - 46.0 % Final    Physical Exam:  General: alert, cooperative and appears stated age 9: appropriate Uterine Fundus: firm Incision: healing well, no significant drainage, no dehiscence, no significant erythema DVT Evaluation: No evidence of DVT seen on physical exam.  Discharge Diagnoses: Term Pregnancy-delivered  Discharge Information: Date: 09/16/2019 Activity: pelvic rest Diet: routine Medications: None Condition: improved Instructions: refer to practice specific booklet Discharge to: home   Newborn Data: Live born female  Birth Weight: 7 lb 10.2 oz (3464 g) APGAR: 9, 9  Newborn Delivery   Birth date/time: 09/15/2019 06:38:00 Delivery type: Vaginal, Spontaneous      Home with mother.  Cyril Mourning 09/16/2019, 8:00 AM

## 2019-09-16 NOTE — Lactation Note (Addendum)
This note was copied from a baby's chart. Lactation Consultation Note  Patient Name: Lauren Small [id number] Date: 09/16/2019 Reason for consult: Follow-up assessment;Term;Nipple pain/trauma;Difficult latch  Called to mother's room by Lauren Rea, RN with mom requesting formula at this time.  LC entered room to find mom in bed using DEBP and FOB at bedside holding infant. Infant awake and alert, multiple feeding cues displaying but infant remains content. Mom states she asked for formula because her nipples hurt and the pain is unbearable when infant latches. Mom describes pain with each suckle. Mom states she's been told multiple times that latch looks perfect but when baby comes off breast, her nipple is pinched and red.  Mom states the pain brings tears to her eyes with each latch. Mom states she was able to exclusively pump her first child for six months and was able to use breastmilk exclusively in the bottles. Mom states she had to supplement with formula after six months. Mom states she is not interested in exclusively pumping again as she feels she missed out on a lot of bonding with her baby because she was pumping. Mom states she is ok with giving her baby formula. Mom states she stopped using the nipple shield as she felt it wasn't helping anyway.  Mom appears overwhelmed and states nothing is coming out even with pumping. Small amount of colostrum noted on inferior side of flange. Nipples pulled in to flange and fit appears appropriate. Mom states her nipples are "flatish." Reviewed nipples will become more erect and elongated with continued stimulation by baby and/or pump. Nipples however do appear red and inflamed. Mom states she has been using coconut oil and comfort gels. Reviewed use of each product. Reviewed latch techniques and her description sounds like baby is not getting on to nipple deep enough. Offered to assist with latch, but mom adamant about giving formula at this time.  Educated mom about ways to supplement at the breast such as SNS or feeding tube and mom states she would prefer to offer infant a bottle. Lauren Rea, RN at bedside states she will bring similac formula to bedside.  Reviewed expected output with exclusive breastfeeding. Reviewed engorgement treatment with ice, hand expression, putting baby to the breast, and pumping. Reviewed milk storage guidelines in MB handbook. Reviewed how to contact [facility] after discharge. Mom interested in follow-up phone call next week with possible outpatient consultation if necessary. Request sent. Mom desires to exclusively pump until nipples heal. Strongly encouraged mom to use DEBP every 3 hours and have infant STS as much as possible.  Maternal Data Has patient been taught Hand Expression?: Yes Does the patient have breastfeeding experience prior to this delivery?: Yes   Interventions Interventions: Breast feeding basics reviewed;Skin to skin;Hand express;Pre-pump if needed;Position options;Expressed milk;Coconut oil;Comfort gels;DEBP;Ice  Lactation Tools Discussed/Used Tools: Pump;Coconut oil;Comfort gels;Nipple Shields Breast pump type: Double-Electric Breast Pump Pump Review: Setup, frequency, and cleaning;Milk Storage   Consult Status Consult Status: Complete Date: 09/16/19    Lauren Small 09/16/2019, 10:53 AM

## 2019-10-28 DIAGNOSIS — M545 Low back pain: Secondary | ICD-10-CM | POA: Diagnosis not present

## 2019-10-28 DIAGNOSIS — Z309 Encounter for contraceptive management, unspecified: Secondary | ICD-10-CM | POA: Diagnosis not present

## 2019-10-28 DIAGNOSIS — Z1389 Encounter for screening for other disorder: Secondary | ICD-10-CM | POA: Diagnosis not present

## 2019-11-08 ENCOUNTER — Ambulatory Visit: Payer: BC Managed Care – PPO | Attending: Obstetrics and Gynecology | Admitting: Physical Therapy

## 2019-11-08 ENCOUNTER — Other Ambulatory Visit: Payer: Self-pay

## 2019-11-08 ENCOUNTER — Encounter: Payer: Self-pay | Admitting: Physical Therapy

## 2019-11-08 DIAGNOSIS — M545 Low back pain, unspecified: Secondary | ICD-10-CM

## 2019-11-08 DIAGNOSIS — M533 Sacrococcygeal disorders, not elsewhere classified: Secondary | ICD-10-CM | POA: Diagnosis not present

## 2019-11-08 DIAGNOSIS — M6281 Muscle weakness (generalized): Secondary | ICD-10-CM | POA: Insufficient documentation

## 2019-11-08 DIAGNOSIS — R252 Cramp and spasm: Secondary | ICD-10-CM | POA: Diagnosis not present

## 2019-11-08 NOTE — Therapy (Signed)
[facility] [address] [place], [place], [address] Phone: [phone]   Fax:  [phone]  Physical Therapy Evaluation  Patient Details  Name: Lauren Small MRN: [id number] Date of Birth: 1986/03/21 Referring Provider (PT): Dr. Belva AgeeElise Leger   Encounter Date: 11/08/2019  PT End of Session - 11/08/19 1016    Visit Number  1    Date for PT Re-Evaluation  01/03/20    PT Start Time  0930    PT Stop Time  1015    PT Time Calculation (min)  45 min    Activity Tolerance  Patient tolerated treatment well;No increased pain    Behavior During Therapy  WFL for tasks assessed/performed       Past Medical History:  Diagnosis Date  . Allergy   . Asthma   . Blood in stool   . GERD (gastroesophageal reflux disease)   . History of chicken pox     Past Surgical History:  Procedure Laterality Date  . urethra polyp    . WISDOM TOOTH EXTRACTION      There were no vitals filed for this visit.   Subjective Assessment - 11/08/19 0934    Subjective  Patient just had a baby 7 weeks ago and tailbone is really hurting her. She has seen several pelvic floor therapist in the past. Including diastasis recti, tight pelvic floor, sI joint hurting. I went to break through PT and did breathing techniques, manual techniques, using a wand, internal trigger points, Patient is constipated.    Patient Stated Goals  reduce the tailbone pain, sacral pain    Currently in Pain?  Yes    Pain Score  9     Pain Location  Coccyx    Pain Orientation  Mid    Pain Descriptors / Indicators  Sharp    Pain Type  Acute pain    Pain Onset  More than a month ago    Pain Frequency  Intermittent    Aggravating Factors   sitting, standing too long, bowel movements    Pain Relieving Factors  right angle with sittng    Multiple Pain Sites  Yes    Pain Score  8    Pain Location  Back    Pain Orientation  Lower    Pain Descriptors / Indicators   Radiating    Pain Type  Acute pain    Pain Onset  More than a month ago    Pain Frequency  Intermittent    Aggravating Factors   if on feet too long    Pain Relieving Factors  engaging the TA, tylenol         [facility] PT Assessment - 11/08/19 0001      Assessment   Medical Diagnosis  M54.5 Low back pain; sacral pain    Referring Provider (PT)  Dr. Belva AgeeElise Leger    Onset Date/Surgical Date  09/15/19    Prior Therapy  yes      Precautions   Precautions  None      Restrictions   Weight Bearing Restrictions  No      Balance Screen   Has the patient fallen in the past 6 months  No    Has the patient had a decrease in activity level because of a fear of falling?   No    Is the patient reluctant to leave their home because of a fear of falling?   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    Leisure  work out 5-6 times per week      Cognition   Overall Cognitive Status  Within Functional Limits for tasks assessed      ROM / Strength   AROM / PROM / Strength  AROM;PROM;Strength      AROM   Lumbar Extension  decreased by 50%    Lumbar - Right Side Bend  decreased by 25%    Lumbar - Right Rotation  decreased by 25%    Lumbar - Left Rotation  decreased by 25%      Strength   Right Hip Flexion  4/5    Right Hip Extension  4/5    Right Hip ABduction  3+/5    Right Hip ADduction  4/5      Palpation   Spinal mobility  T4-T10 decreased PA movement, Decreased movement of L3-L5    SI assessment   right ilium rotated anteriorly, pubic bone equal but tender,     Palpation comment  right rib anteriorly is outflared, decreased right lower rib cage mobility; tenderness located in lower adominal, upper mid line, tightness in the iliopsoas, anterior righ thip and ITB                Objective measurements completed on  examination: See above findings.    Pelvic Floor Special Questions - 11/08/19 0001    Prior Pregnancies  Yes    Number of Pregnancies  2    Number of Vaginal Deliveries  2   last one fast and face up   Any difficulty with labor and deliveries  Yes   grade 3, grade 1-2 for the second   Diastasis Recti  3 fingers width above and below umbilicus    Currently Sexually Active  Yes    Is this Painful  Yes    Marinoff Scale  discomfort that does not affect completion   tender internally,    Urinary Leakage  No    Fecal incontinence  No   constipation   Falling out feeling (prolapse)  No    Pelvic Floor Internal Exam  Patient confirms identification and approves PT to assess pelvic floor and treatment    Exam Type  Rectal    Palpation  tightness in the external sphincter, left side of coccyx, coccyx is anterior and to the left, tenderness located in the obturator internist, difficulty with pushinc therapist finger out with bulging of the pelvic floor    Strength  fair squeeze, definite lift       [facility] Adult PT Treatment/Exercise - 11/08/19 0001      Manual Therapy   Manual Therapy  Internal Pelvic Floor;Muscle Energy Technique;Joint mobilization    Joint Mobilization  sacrum to rotate to the right, rotational and P-A mobilization to L1-L5    Internal Pelvic Floor  distraction of the coccyx, soft tissue work on the levator ani and obturator internist    Muscle Energy Technique  correct right ilium                PT Short Term Goals - 11/08/19 1610      PT SHORT TERM GOAL #1   Title  independent with initial HEP    Time  4    Period  Weeks    Status  New    Target Date  12/06/19      PT SHORT TERM GOAL #2   Title  pelvis stays in correct alignment for 4 weeks due to improved stability    Time  4    Period  Weeks    Status  New    Target Date  12/06/19      PT SHORT TERM GOAL #3   Title  understand correct toileting techniques to improve constipation and ways to  manage consitpation    Time  4    Period  Weeks    Status  New    Target Date  12/06/19      PT SHORT TERM GOAL #4   Title  pain with movement in bed decreased >/= 25%    Time  4    Period  Weeks    Status  New    Target Date  12/06/19      PT SHORT TERM GOAL #5   Title  coccyx pain with sitting and bowel movements decreased >/= 25%    Time  4    Period  Weeks    Status  New    Target Date  12/06/19        PT Long Term Goals - 11/08/19 1612      PT LONG TERM GOAL #1   Title  independent with advanced HEP    Time  8    Period  Weeks    Status  New    Target Date  01/03/20      PT LONG TERM GOAL #2   Title  coccyx pain decreased >/= 75% with sitting    Time  8    Period  Weeks    Status  New    Target Date  01/03/20      PT LONG TERM GOAL #3   Title  diastasis recti decreases >/= 2 fingers width with good fascial tension    Time  4    Period  Weeks    Status  New    Target Date  01/03/20      PT LONG TERM GOAL #4   Title  sitting and standing with pain decreased >/= 75% due to improve stability of SI joint and lumbar area    Time  8    Period  Weeks    Status  New    Target Date  01/03/20      PT LONG TERM GOAL #5   Title  pain with    Baseline  feeling of vagina area feeling heavy and swollen decreased >/= 75% with standing to shop or house work due to improved pelvic floor strength    Time  8    Period  Weeks    Status  New    Target Date  01/03/20             Plan - 11/08/19 1549    Clinical Impression Statement  Patient is a 33 year old female who gave birth vaginally and having coccyx pain, sacral pain and back and pelvic pain. Patient pain level is 8-9/10 in the coccyx and back with sitting, standing, having bowel movement, and intercourse. Patient reports when she stands too long her vaginal area feels swollen and weak. Rectal strength is 3/5 and not able to push the therapist finger out of the rectal canal. Coccyx is anterior and rotated to  the left. Tenderness located in the  levator ani and obturator internist. Patient reports no urinary leakage or fecal leakage. Lumbar extension decreased by 50% while lumbar rotation and right sidebending decreased by 25%. Right ilium is rotated anteriorly and sacrum is rotated left. Pubic bone superiorly is tender to touch. Patient reports SPD in the past. Patient wore a SI belt in the begining of her pregnancy. Right hip fleixon is 4/5 and right hip abduction is 3+/5. Decreased movement of T4-T10 and L3-L5. Right rib is out flared and decreased movement of right lower rib cage. Tenderness located in the lower abdominal area and tightness in the right iliipsoas. Diastasis Recti is 3 fingers width above and below the umbilicus and has good fascial support. Patient report issues with constipation.Patient had two vaginal births with 3rd degree and 2nd degree tear. Patient will benefit from skilled therapy to improve strength, stabilzation of the SI joint, reducing coccyx pain.    Personal Factors and Comorbidities  Comorbidity 1;Comorbidity 2    Comorbidities  IBS, diastasis Recti    Examination-Activity Limitations  Caring for Others;Stand;Toileting;Locomotion Level;Transfers;Bend;Sit    Examination-Participation Restrictions  Community Activity;Interpersonal Relationship    Stability/Clinical Decision Making  Evolving/Moderate complexity    Clinical Decision Making  Moderate    Rehab Potential  Excellent    PT Frequency  1x / week    PT Duration  8 weeks    PT Treatment/Interventions  ADLs/Self Care Home Management;Biofeedback;Cryotherapy;Electrical Stimulation;Iontophoresis 4mg /ml Dexamethasone;Moist Heat;Ultrasound;Balance training;Therapeutic exercise;Therapeutic activities;Neuromuscular re-education;Patient/family education;Dry needling;Manual techniques;Taping;Spinal Manipulations    PT Next Visit Plan  correct the pelvis, SI belt, check pelvic floor strength and assessment of vaginal muscles. soft  tissue work to reduce diastasis recti,    Consulted and Agree with Plan of Care  Patient       Patient will benefit from skilled therapeutic intervention in order to improve the following deficits and impairments:  Decreased coordination, Decreased range of motion, Increased fascial restricitons, Decreased endurance, Increased muscle spasms, Pain, Impaired flexibility, Decreased strength, Decreased mobility  Visit Diagnosis: Acute bilateral low back pain without sciatica  Coccyx pain  Muscle weakness (generalized)  Cramp and spasm     Problem List Patient Active Problem List   Diagnosis Date Noted  . Pregnant 09/15/2019    09/17/2019, PT 11/08/19 4:19 PM   [facility] [address] [place], [place], [place] Phone: [phone]   Fax:  [phone]  Name: Lauren Small MRN: Lauren Small Date of Birth: 07-13-1986

## 2019-11-22 ENCOUNTER — Encounter: Payer: Self-pay | Admitting: Physical Therapy

## 2019-11-22 ENCOUNTER — Ambulatory Visit: Payer: BC Managed Care – PPO | Admitting: Physical Therapy

## 2019-11-22 ENCOUNTER — Other Ambulatory Visit: Payer: Self-pay

## 2019-11-22 DIAGNOSIS — M6281 Muscle weakness (generalized): Secondary | ICD-10-CM | POA: Diagnosis not present

## 2019-11-22 DIAGNOSIS — M545 Low back pain, unspecified: Secondary | ICD-10-CM

## 2019-11-22 DIAGNOSIS — M533 Sacrococcygeal disorders, not elsewhere classified: Secondary | ICD-10-CM | POA: Diagnosis not present

## 2019-11-22 DIAGNOSIS — R252 Cramp and spasm: Secondary | ICD-10-CM

## 2019-11-22 NOTE — Patient Instructions (Signed)

## 2019-11-22 NOTE — Therapy (Signed)
[facility] [address] [place], [place], [address] Phone: [phone]   Fax:  [phone]  Physical Therapy Treatment  Patient Details  Name: Lauren Small MRN: [id number] Date of Birth: January 04, 1986 Referring Provider (PT): Dr. Lucillie Garfinkel   Encounter Date: 11/22/2019  PT End of Session - 11/22/19 1619    Visit Number  2    Date for PT Re-Evaluation  01/03/20    PT Start Time  6269    PT Stop Time  4854    PT Time Calculation (min)  49 min    Activity Tolerance  Patient tolerated treatment well;No increased pain    Behavior During Therapy  WFL for tasks assessed/performed       Past Medical History:  Diagnosis Date  . Allergy   . Asthma   . Blood in stool   . GERD (gastroesophageal reflux disease)   . History of chicken pox     Past Surgical History:  Procedure Laterality Date  . urethra polyp    . WISDOM TOOTH EXTRACTION      There were no vitals filed for this visit.  Subjective Assessment - 11/22/19 1619    Subjective  No difference with tailbone since the manual work.    Patient Stated Goals  reduce the tailbone pain, sacral pain    Currently in Pain?  Yes    Pain Score  9     Pain Location  Coccyx    Pain Orientation  Mid    Pain Descriptors / Indicators  Sharp    Pain Type  Acute pain    Pain Onset  More than a month ago    Pain Frequency  Intermittent    Aggravating Factors   sitting, standing too long, bowel movements    Pain Relieving Factors  right angle with sitting    Multiple Pain Sites  Yes    Pain Score  8    Pain Location  Back    Pain Orientation  Lower    Pain Descriptors / Indicators  Radiating    Pain Type  Acute pain    Pain Onset  More than a month ago    Pain Frequency  Intermittent    Aggravating Factors   if on feet too long    Pain Relieving Factors  engaging the TA, tylenol         [facility] PT Assessment - 11/22/19 0001      Assessment   Medical  Diagnosis  M54.5 Low back pain; sacral pain    Referring Provider (PT)  Dr. 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    Leisure  work out 5-6 times per week      Cognition   Overall Cognitive Status  Within Functional Limits for tasks assessed      AROM   Lumbar Extension  decreased by 50%    Lumbar - Right Side Bend  decreased by 25%    Lumbar - Right Rotation  decreased by 25%  Lumbar - Left Rotation  decreased by 25%      Strength   Right Hip Flexion  4/5    Right Hip Extension  4/5    Right Hip ABduction  3+/5    Right Hip ADduction  4/5      Palpation   Spinal mobility  T4-T10 decreased PA movement, Decreased movement of L3-L5    SI assessment   right ilium rotated anteriorly, pubic bone equal but tender,     Palpation comment  right rib anteriorly is outflared, decreased right lower rib cage mobility; tenderness located in lower adominal, upper mid line, tightness in the iliopsoas, anterior righ thip and ITB                Pelvic Floor Special Questions - 11/22/19 0001    Number of Pregnancies  2    Number of Vaginal Deliveries  2   last one fast and face up   Any difficulty with labor and deliveries  Yes   grade 3, grade 1-2 for the second   Diastasis Recti  3 fingers width above and below umbilicus    Currently Sexually Active  Yes    Is this Painful  Yes    Marinoff Scale  discomfort that does not affect completion   tender internally,    Urinary Leakage  No    Fecal incontinence  No   constipation   Falling out feeling (prolapse)  No    Pelvic Floor Internal Exam  Patient confirms identification and approves PT to assess pelvic  floor and treatment    Exam Type  Vaginal    Palpation  increased muscle thickness and pain on the     Strength  fair squeeze, definite lift   rectal strength is 3/5       [facility] Adult PT Treatment/Exercise - 11/22/19 0001      Self-Care   Self-Care  Other Self-Care Comments    Other Self-Care Comments   return to using her internal wand and when on a trigger point hold for 90 seconds, no pain greater than 3/10      Therapeutic Activites    Therapeutic Activities  ADL's    ADL's  contracting the lower abdominals with bed mobility, and going form supine to sit with keeping knees together and not raising 2 legs up at time.       Lumbar Exercises: Stretches   Active Hamstring Stretch  Right;Left;1 rep;30 seconds    Active Hamstring Stretch Limitations  with strap    ITB Stretch  Right;Left;1 rep    ITB Stretch Limitations  with strap    Piriformis Stretch  Right;Left;1 rep;30 seconds   with strap   Other Lumbar Stretch Exercise  hip adductors with strap holding for 30 seconds      Manual Therapy   Manual Therapy  Joint mobilization;Soft tissue mobilization;Muscle Energy Technique    Manual therapy comments  after correcting the pelvis placed SI belt on the pelvis and explained to patient to wear all day and night     Joint Mobilization  scaral mobilization to correct right sidebending    Soft tissue mobilization  bil. hip adductors, quads, externally to levator ani and pirifomris and around the coccyx    Internal Pelvic Floor  in right sidely soft tissue work to the [employer] internist, levator ani, ATLA, around the ischial spine    Muscle Energy Technique  correct right ilium        Trigger Point  Dry Needling - 11/22/19 0001    Consent Given?  Yes    Education Handout Provided  Yes    Muscles Treated Lower Quadrant  Adductor longus/brevis/magnus;Rectus femoris    Adductor Response  Twitch response elicited;Palpable increased muscle length    Rectus femoris Response  Twitch  response elicited;Palpable increased muscle length           PT Education - 11/22/19 1707    Education Details  information on dry needling; self correction of the right ilium, wearing SI belt all day and night    Person(s) Educated  Patient    Methods  Explanation;Handout    Comprehension  Verbalized understanding       PT Short Term Goals - 11/08/19 1610      PT SHORT TERM GOAL #1   Title  independent with initial HEP    Time  4    Period  Weeks    Status  New    Target Date  12/06/19      PT SHORT TERM GOAL #2   Title  pelvis stays in correct alignment for 4 weeks due to improved stability    Time  4    Period  Weeks    Status  New    Target Date  12/06/19      PT SHORT TERM GOAL #3   Title  understand correct toileting techniques to improve constipation and ways to manage consitpation    Time  4    Period  Weeks    Status  New    Target Date  12/06/19      PT SHORT TERM GOAL #4   Title  pain with movement in bed decreased >/= 25%    Time  4    Period  Weeks    Status  New    Target Date  12/06/19      PT SHORT TERM GOAL #5   Title  coccyx pain with sitting and bowel movements decreased >/= 25%    Time  4    Period  Weeks    Status  New    Target Date  12/06/19        PT Long Term Goals - 11/08/19 1612      PT LONG TERM GOAL #1   Title  independent with advanced HEP    Time  8    Period  Weeks    Status  New    Target Date  01/03/20      PT LONG TERM GOAL #2   Title  coccyx pain decreased >/= 75% with sitting    Time  8    Period  Weeks    Status  New    Target Date  01/03/20      PT LONG TERM GOAL #3   Title  diastasis recti decreases >/= 2 fingers width with good fascial tension    Time  4    Period  Weeks    Status  New    Target Date  01/03/20      PT LONG TERM GOAL #4   Title  sitting and standing with pain decreased >/= 75% due to improve stability of SI joint and lumbar area    Time  8    Period  Weeks    Status  New     Target Date  01/03/20      PT LONG TERM GOAL #5   Title  pain with    Baseline  feeling of vagina area feeling heavy and swollen decreased >/= 75% with standing to shop or house work due to improved pelvic floor strength    Time  8    Period  Weeks    Status  New    Target Date  01/03/20            Plan - 11/22/19 1720    Clinical Impression Statement  After therapy pain decreased from 9/10 to 5/10. Patient had trigger points in the hip adductors and quads. Patient pelvis was in correct alignment after therapy. Patient had increased muscle bulk on the right pelvic floor muscles and more tenderness. Patient understands how to place the SI belt on. Patient understands to contract her lower abdominals with transitional movements and not lifting 2 legs at a time. Patient has increased fascial restrictions in the right quadriceps. Patient will benefit from skilled therapy to improve strength, stabilization of the SI joint, and reducing coccyx pain.    Personal Factors and Comorbidities  Comorbidity 1;Comorbidity 2    Comorbidities  IBS, diastasis Recti    Examination-Activity Limitations  Caring for Others;Stand;Toileting;Locomotion Level;Transfers;Bend;Sit    Examination-Participation Restrictions  Community Activity;Interpersonal Relationship    Stability/Clinical Decision Making  Evolving/Moderate complexity    Rehab Potential  Excellent    PT Frequency  2x / week    PT Duration  8 weeks    PT Treatment/Interventions  ADLs/Self Care Home Management;Biofeedback;Cryotherapy;Electrical Stimulation;Iontophoresis 4mg /ml Dexamethasone;Moist Heat;Ultrasound;Balance training;Therapeutic exercise;Therapeutic activities;Neuromuscular re-education;Patient/family education;Dry needling;Manual techniques;Taping;Spinal Manipulations    PT Next Visit Plan  correct the pelvis, SI belt,  soft tissue work to reduce diastasis recti, internal soft tissue work, lower abdominal ex with hip rotation, and arm  movement,    Recommended Other Services  MD signed intiial note    Consulted and Agree with Plan of Care  Patient       Patient will benefit from skilled therapeutic intervention in order to improve the following deficits and impairments:  Decreased coordination, Decreased range of motion, Increased fascial restricitons, Decreased endurance, Increased muscle spasms, Pain, Impaired flexibility, Decreased strength, Decreased mobility  Visit Diagnosis: Acute bilateral low back pain without sciatica - Plan: PT plan of care cert/re-cert  Coccyx pain - Plan: PT plan of care cert/re-cert  Muscle weakness (generalized) - Plan: PT plan of care cert/re-cert  Cramp and spasm - Plan: PT plan of care cert/re-cert     Problem List Patient Active Problem List   Diagnosis Date Noted  . Pregnant 09/15/2019    09/17/2019, PT 11/22/19 5:31 PM   [facility] [address] [place], [place], [place] Phone: [phone]   Fax:  [phone]  Name: Lauren Small MRN: Lysle Morales Date of Birth: Apr 21, 1986

## 2019-11-29 ENCOUNTER — Ambulatory Visit: Payer: BC Managed Care – PPO | Attending: Obstetrics and Gynecology | Admitting: Physical Therapy

## 2019-11-29 ENCOUNTER — Other Ambulatory Visit: Payer: Self-pay

## 2019-11-29 ENCOUNTER — Encounter: Payer: Self-pay | Admitting: Physical Therapy

## 2019-11-29 DIAGNOSIS — M545 Low back pain, unspecified: Secondary | ICD-10-CM

## 2019-11-29 DIAGNOSIS — M533 Sacrococcygeal disorders, not elsewhere classified: Secondary | ICD-10-CM | POA: Diagnosis not present

## 2019-11-29 DIAGNOSIS — M6281 Muscle weakness (generalized): Secondary | ICD-10-CM | POA: Insufficient documentation

## 2019-11-29 DIAGNOSIS — R252 Cramp and spasm: Secondary | ICD-10-CM | POA: Diagnosis not present

## 2019-11-29 NOTE — Therapy (Signed)
[facility] [address] [place], [place], [address] Phone: [phone]   Fax:  [phone]  Physical Therapy Treatment  Patient Details  Name: Lauren Small MRN: [id number] Date of Birth: 1986-07-08 Referring Provider (PT): Dr. Lucillie Garfinkel   Encounter Date: 11/29/2019  PT End of Session - 11/29/19 1721    Visit Number  3    Date for PT Re-Evaluation  01/03/20    Authorization Type  bcbs    PT Start Time  1615    PT Stop Time  1700    PT Time Calculation (min)  45 min    Activity Tolerance  Patient tolerated treatment well;No increased pain    Behavior During Therapy  WFL for tasks assessed/performed       Past Medical History:  Diagnosis Date  . Allergy   . Asthma   . Blood in stool   . GERD (gastroesophageal reflux disease)   . History of chicken pox     Past Surgical History:  Procedure Laterality Date  . urethra polyp    . WISDOM TOOTH EXTRACTION      There were no vitals filed for this visit.  Subjective Assessment - 11/29/19 1617    Subjective  Pain in tailbone was relieved for several days after therapy.    Patient Stated Goals  reduce the tailbone pain, sacral pain    Currently in Pain?  Yes    Pain Score  6     Pain Location  Coccyx    Pain Orientation  Mid    Pain Descriptors / Indicators  Sharp    Pain Type  Acute pain    Pain Onset  More than a month ago    Pain Frequency  Intermittent    Aggravating Factors   sitting, standing too long, bowel movements    Pain Relieving Factors  right angle with sitting    Multiple Pain Sites  Yes    Pain Score  6    Pain Location  Back    Pain Orientation  Lower    Pain Descriptors / Indicators  Radiating    Pain Type  Acute pain    Pain Onset  More than a month ago    Pain Frequency  Intermittent    Aggravating Factors   if on feet too long    Pain Relieving Factors  engaging  the TA Tylenol                       [facility]  Adult PT Treatment/Exercise - 11/29/19 0001      Self-Care   Self-Care  Other Self-Care Comments    Other Self-Care Comments   instructed pain on how to see if her ASIS are in correct alignment in standing in front of mirror      Therapeutic Activites    Therapeutic Activities  ADL's    ADL's  rolling in bed and getting out of bed with contracting the lower abdominals      Neuro Re-ed    Neuro Re-ed Details   contracting the lower abdominals engaging the transvers abdominus       Lumbar Exercises: Stretches   Hip Flexor Stretch  Right;Left;60 seconds    Hip Flexor Stretch Limitations  foam roll being careful of pubic bone    ITB Stretch  Right;Left;1 rep;60 seconds    ITB Stretch Limitations  foam roll      Manual Therapy   Manual  Therapy  Joint mobilization;Soft tissue mobilization;Myofascial release    Manual therapy comments  using assistive device for Iastim to lumbar and gluteal muscles, used suction of the skin off the muscle    Joint Mobilization  P-A and rotational mobilization to L1-L5, lower rib cage mobilizaiton to improve breathing    Soft tissue mobilization  lumbar paraspinals, right coccygeus, lower abdominal tissue, diaphragm, and lower intercoastal muscles    Myofascial Release  quadruped with pulling of the back tissue forward to the abdomen, lifting up the lower abdominal tissue with diagonals and going back and forth on knees, pressure on the anterior coccyx area and transverse perineum in quadruped going diagonal and anterior and posterior    Muscle Energy Technique  correct right ilium        Trigger Point Dry Needling - 11/29/19 0001    Consent Given?  Yes    Education Handout Provided  Previously provided    Muscles Treated Back/Hip  Lumbar multifidi    Other Dry Needling  right coccygeus    Lumbar multifidi Response  Twitch response elicited;Palpable increased muscle length           PT Education - 11/29/19 1710    Education Details  how to find out  if ASIS are equal,Access Code: [email or web address]; roll in bed with abdominal engagement    Person(s) Educated  Patient    Methods  Explanation;Demonstration;Verbal cues;Handout    Comprehension  Returned demonstration;Verbalized understanding       PT Short Term Goals - 11/08/19 1610      PT SHORT TERM GOAL #1   Title  independent with initial HEP    Time  4    Period  Weeks    Status  New    Target Date  12/06/19      PT SHORT TERM GOAL #2   Title  pelvis stays in correct alignment for 4 weeks due to improved stability    Time  4    Period  Weeks    Status  New    Target Date  12/06/19      PT SHORT TERM GOAL #3   Title  understand correct toileting techniques to improve constipation and ways to manage consitpation    Time  4    Period  Weeks    Status  New    Target Date  12/06/19      PT SHORT TERM GOAL #4   Title  pain with movement in bed decreased >/= 25%    Time  4    Period  Weeks    Status  New    Target Date  12/06/19      PT SHORT TERM GOAL #5   Title  coccyx pain with sitting and bowel movements decreased >/= 25%    Time  4    Period  Weeks    Status  New    Target Date  12/06/19        PT Long Term Goals - 11/08/19 1612      PT LONG TERM GOAL #1   Title  independent with advanced HEP    Time  8    Period  Weeks    Status  New    Target Date  01/03/20      PT LONG TERM GOAL #2   Title  coccyx pain decreased >/= 75% with sitting    Time  8    Period  Weeks    Status  New  Target Date  01/03/20      PT LONG TERM GOAL #3   Title  diastasis recti decreases >/= 2 fingers width with good fascial tension    Time  4    Period  Weeks    Status  New    Target Date  01/03/20      PT LONG TERM GOAL #4   Title  sitting and standing with pain decreased >/= 75% due to improve stability of SI joint and lumbar area    Time  8    Period  Weeks    Status  New    Target Date  01/03/20      PT LONG TERM GOAL #5   Title  pain with    Baseline  feeling of  vagina area feeling heavy and swollen decreased >/= 75% with standing to shop or house work due to improved pelvic floor strength    Time  8    Period  Weeks    Status  New    Target Date  01/03/20            Plan - 11/29/19 1711    Clinical Impression Statement  Patient had several days without pain in coccyx region. After today the coccyx pain was 0/10. Patient was able to engage her abdominals with minimal to no diastasis recti. Patient had trigger points in the lumbar paraspinals and right coccygeus. Patient had improve rib mobiliy after therapy. Patient is not able to do exercises with one or two legs off the mat at this time. She has less soreness in the lower abdominals. Patient continues to wear the SI belt. Patient will benefit from skilled therapy to improve strength, stabilization of the SI joint and reducing coccyx pain.    Personal Factors and Comorbidities  Comorbidity 1;Comorbidity 2    Comorbidities  IBS, diastasis Recti    Examination-Activity Limitations  Caring for Others;Stand;Toileting;Locomotion Level;Transfers;Bend;Sit    Examination-Participation Restrictions  Community Activity;Interpersonal Relationship    Stability/Clinical Decision Making  Evolving/Moderate complexity    Rehab Potential  Excellent    PT Frequency  2x / week    PT Duration  8 weeks    PT Treatment/Interventions  ADLs/Self Care Home Management;Biofeedback;Cryotherapy;Electrical Stimulation;Iontophoresis 4mg /ml Dexamethasone;Moist Heat;Ultrasound;Balance training;Therapeutic exercise;Therapeutic activities;Neuromuscular re-education;Patient/family education;Dry needling;Manual techniques;Taping;Spinal Manipulations    PT Next Visit Plan  correct the pelvis, SI belt,   internal soft tissue work, lower abdominal ex with hip rotation, and arm movement, Prone abdominal contraction    PT Home Exercise Plan  Access Code: [email or web address]    Consulted and Agree with Plan of Care  Patient       Patient will  benefit from skilled therapeutic intervention in order to improve the following deficits and impairments:  Decreased coordination, Decreased range of motion, Increased fascial restricitons, Decreased endurance, Increased muscle spasms, Pain, Impaired flexibility, Decreased strength, Decreased mobility  Visit Diagnosis: Acute bilateral low back pain without sciatica  Coccyx pain  Muscle weakness (generalized)  Cramp and spasm     Problem List Patient Active Problem List   Diagnosis Date Noted  . Pregnant 09/15/2019    Eulis Fosterheryl Shamicka Inga, PT 11/29/19 5:22 PM   [facility] [address] [place], [place], [id number] Phone: [phone]   Fax:  [phone]  Name: Lauren Small MRN: [id number] Date of Birth: Jan 31, 1986

## 2019-11-29 NOTE — Patient Instructions (Signed)
Access Code: [email or web address]  URL: [email or web address]  Date: 11/29/2019  Prepared by: Earlie Counts   Exercises Hooklying Transversus Abdominis Palpation - 10 reps - 1 sets - 5 sec hold - 2-3x daily - 7x weekly [facility] [address], [place], [place] [address] Phone # [phone] Fax [phone]

## 2019-12-02 ENCOUNTER — Other Ambulatory Visit: Payer: Self-pay

## 2019-12-02 ENCOUNTER — Ambulatory Visit: Payer: BC Managed Care – PPO | Admitting: Physical Therapy

## 2019-12-02 ENCOUNTER — Encounter: Payer: Self-pay | Admitting: Physical Therapy

## 2019-12-02 DIAGNOSIS — M545 Low back pain, unspecified: Secondary | ICD-10-CM

## 2019-12-02 DIAGNOSIS — M533 Sacrococcygeal disorders, not elsewhere classified: Secondary | ICD-10-CM

## 2019-12-02 DIAGNOSIS — R252 Cramp and spasm: Secondary | ICD-10-CM | POA: Diagnosis not present

## 2019-12-02 DIAGNOSIS — M6281 Muscle weakness (generalized): Secondary | ICD-10-CM | POA: Diagnosis not present

## 2019-12-02 NOTE — Therapy (Signed)
[facility] [address], [place], [place], [address] Phone: [phone]   Fax:  [phone]  Physical Therapy Treatment  Patient Details  Name: Lauren Small MRN: [id number] Date of Birth: Jun 03, 1986 Referring Provider (PT): Dr. Lucillie Garfinkel   Encounter Date: 12/02/2019  PT End of Session - 12/02/19 0807    Visit Number  4    Date for PT Re-Evaluation  01/03/20    Authorization Type  bcbs    PT Start Time  0800    PT Stop Time  0840    PT Time Calculation (min)  40 min    Activity Tolerance  Patient tolerated treatment well;No increased pain    Behavior During Therapy  WFL for tasks assessed/performed       Past Medical History:  Diagnosis Date  . Allergy   . Asthma   . Blood in stool   . GERD (gastroesophageal reflux disease)   . History of chicken pox     Past Surgical History:  Procedure Laterality Date  . urethra polyp    . WISDOM TOOTH EXTRACTION      There were no vitals filed for this visit.  Subjective Assessment - 12/02/19 0803    Subjective  I felt good after last visit. After last visit I felt the core engage better. My hip adductors are tight.    Patient Stated Goals  reduce the tailbone pain, sacral pain    Currently in Pain?  Yes    Pain Score  6     Pain Location  Coccyx    Pain Orientation  Mid    Pain Descriptors / Indicators  Sharp    Pain Type  Acute pain    Pain Onset  More than a month ago    Pain Frequency  Intermittent    Aggravating Factors   sitting, standing too long, bowel movements    Pain Relieving Factors  right angle with sitting    Multiple Pain Sites  Yes    Pain Score  2    Pain Location  Back    Pain Orientation  Lower    Pain Descriptors / Indicators  Radiating    Pain Type  Acute pain    Pain Onset  More than a month ago    Pain Frequency  Intermittent    Aggravating Factors   if on feet too long    Pain Relieving Factors  engaging the TA, Tylenol                     Pelvic Floor Special Questions - 12/02/19 0001    Diastasis Recti  2.5 fingers         [facility] Adult PT Treatment/Exercise - 12/02/19 0001      Lumbar Exercises: Stretches   Active Hamstring Stretch  Right;Left;1 rep;30 seconds    Active Hamstring Stretch Limitations  with strap    ITB Stretch  Right;Left;1 rep    ITB Stretch Limitations  with strap    Piriformis Stretch  Right;Left;1 rep;30 seconds   with strap   Piriformis Stretch Limitations  with strap    Other Lumbar Stretch Exercise  hip adductors with strap holding for 30 seconds      Lumbar Exercises: Supine   Ab Set  10 reps;5 seconds    Clam  10 reps;1 second    Clam Limitations  each leg with pelvic floor contraction and abdominal      Manual Therapy  Manual Therapy  Soft tissue mobilization;Joint mobilization    Joint Mobilization  P-A and rotational mobilization to L1-L5, lower rib cage mobilizaiton to improve breathing; sidely gapping of the right lumbar facets    Soft tissue mobilization  soft tissue work to the lumbar paraspinals, right gluteal, and bil. quadratus lumborum, using the addaday with to the hip adductors and ITB band             PT Education - 12/02/19 0850    Education Details  Access Code: [email or web address]    Person(s) Educated  Patient    Methods  Explanation;Demonstration;Handout    Comprehension  Verbalized understanding;Returned demonstration       PT Short Term Goals - 12/02/19 0937      PT SHORT TERM GOAL #1   Title  independent with initial HEP    Time  4    Period  Weeks    Status  Achieved      PT SHORT TERM GOAL #2   Title  pelvis stays in correct alignment for 4 weeks due to improved stability    Time  4    Period  Weeks    Status  On-going      PT SHORT TERM GOAL #3   Title  understand correct toileting techniques to improve constipation and ways to manage consitpation    Time  4    Period  Weeks    Status  On-going      PT SHORT TERM  GOAL #4   Title  pain with movement in bed decreased >/= 25%    Time  4    Period  Weeks    Status  On-going    Target Date  12/06/19      PT SHORT TERM GOAL #5   Title  coccyx pain with sitting and bowel movements decreased >/= 25%    Time  4    Period  Weeks    Status  On-going    Target Date  12/06/19        PT Long Term Goals - 11/08/19 1612      PT LONG TERM GOAL #1   Title  independent with advanced HEP    Time  8    Period  Weeks    Status  New    Target Date  01/03/20      PT LONG TERM GOAL #2   Title  coccyx pain decreased >/= 75% with sitting    Time  8    Period  Weeks    Status  New    Target Date  01/03/20      PT LONG TERM GOAL #3   Title  diastasis recti decreases >/= 2 fingers width with good fascial tension    Time  4    Period  Weeks    Status  New    Target Date  01/03/20      PT LONG TERM GOAL #4   Title  sitting and standing with pain decreased >/= 75% due to improve stability of SI joint and lumbar area    Time  8    Period  Weeks    Status  New    Target Date  01/03/20      PT LONG TERM GOAL #5   Title  pain with    Baseline  feeling of vagina area feeling heavy and swollen decreased >/= 75% with standing to shop or house work due to improved pelvic floor strength  Time  8    Period  Weeks    Status  New    Target Date  01/03/20            Plan - 12/02/19 0841    Clinical Impression Statement  Patient ASIS are equal. Ptietn is able to engage the abdominals better. Patient diastasis has decreased to 2.5 fingers width. Patient had stiffness in the lumbar vertebrae. Patient has decreased endurance of the abdominal muscles when working the core. Patient has not had pubic pain since last week. Patient is wearing her SI belt daily. Patient will benefit from skilled therapy to improve strength, stabilization fo the SI joint and reducing coccyx pain.    Personal Factors and Comorbidities  Comorbidity 1;Comorbidity 2    Comorbidities   IBS, diastasis Recti    Examination-Activity Limitations  Caring for Others;Stand;Toileting;Locomotion Level;Transfers;Bend;Sit    Examination-Participation Restrictions  Community Activity;Interpersonal Relationship    Stability/Clinical Decision Making  Evolving/Moderate complexity    Rehab Potential  Excellent    PT Frequency  2x / week    PT Duration  8 weeks    PT Treatment/Interventions  ADLs/Self Care Home Management;Biofeedback;Cryotherapy;Electrical Stimulation;Iontophoresis 4mg /ml Dexamethasone;Moist Heat;Ultrasound;Balance training;Therapeutic exercise;Therapeutic activities;Neuromuscular re-education;Patient/family education;Dry needling;Manual techniques;Taping;Spinal Manipulations    PT Next Visit Plan  correct the pelvis, SI belt,   internal soft tissue work, lower abdominal ex with hip rotation, and arm movement, Prone abdominal contraction; toileting    PT Home Exercise Plan  Access Code: [email or web address]    Consulted and Agree with Plan of Care  Patient       Patient will benefit from skilled therapeutic intervention in order to improve the following deficits and impairments:  Decreased coordination, Decreased range of motion, Increased fascial restricitons, Decreased endurance, Increased muscle spasms, Pain, Impaired flexibility, Decreased strength, Decreased mobility  Visit Diagnosis: Acute bilateral low back pain without sciatica  Coccyx pain  Muscle weakness (generalized)  Cramp and spasm     Problem List Patient Active Problem List   Diagnosis Date Noted  . Pregnant 09/15/2019    09/17/2019, PT 12/02/19 9:39 AM   [facility] [address] [place], [place], [place] Phone: [phone]   Fax:  [phone]  Name: Lauren Small MRN: Lysle Morales Date of Birth: 10-04-1986

## 2019-12-02 NOTE — Patient Instructions (Signed)
Access Code: [email or web address]  URL: [email or web address]  Date: 12/02/2019  Prepared by: Earlie Counts   Exercises Hooklying Transversus Abdominis Palpation - 10 reps - 1 sets - 5 sec hold - 2-3x daily - 7x weekly Bent Knee Fallouts - 10 reps - 1 sets - 1x daily - 7x weekly [facility] [address] [place], [place] [address] Phone # [phone] Fax [phone]

## 2019-12-06 ENCOUNTER — Ambulatory Visit: Payer: BC Managed Care – PPO | Admitting: Physical Therapy

## 2019-12-06 ENCOUNTER — Other Ambulatory Visit: Payer: Self-pay

## 2019-12-06 ENCOUNTER — Encounter: Payer: Self-pay | Admitting: Physical Therapy

## 2019-12-06 DIAGNOSIS — R252 Cramp and spasm: Secondary | ICD-10-CM

## 2019-12-06 DIAGNOSIS — M545 Low back pain, unspecified: Secondary | ICD-10-CM

## 2019-12-06 DIAGNOSIS — M533 Sacrococcygeal disorders, not elsewhere classified: Secondary | ICD-10-CM

## 2019-12-06 DIAGNOSIS — M6281 Muscle weakness (generalized): Secondary | ICD-10-CM | POA: Diagnosis not present

## 2019-12-06 NOTE — Patient Instructions (Signed)

## 2019-12-06 NOTE — Therapy (Signed)
[facility] [address], [address] [place], [place], [address] Phone: [phone]   Fax:  [phone]  Physical Therapy Treatment  Patient Details  Name: Lauren MoralesKimberly Small MRN: [id number] Date of Birth: 1986-11-16 Referring Provider (PT): Dr. Belva AgeeElise Leger   Encounter Date: 12/06/2019  PT End of Session - 12/06/19 1618    Visit Number  5    Date for PT Re-Evaluation  01/03/20    Authorization Type  bcbs    PT Start Time  1615    PT Stop Time  1655    PT Time Calculation (min)  40 min    Activity Tolerance  Patient tolerated treatment well;No increased pain    Behavior During Therapy  WFL for tasks assessed/performed       Past Medical History:  Diagnosis Date  . Allergy   . Asthma   . Blood in stool   . GERD (gastroesophageal reflux disease)   . History of chicken pox     Past Surgical History:  Procedure Laterality Date  . urethra polyp    . WISDOM TOOTH EXTRACTION      There were no vitals filed for this visit.  Subjective Assessment - 12/06/19 1615    Subjective  I am feeling good. My back is worse since last visit. My SI area is bothering me.    Patient Stated Goals  reduce the tailbone pain, sacral pain    Pain Score  8    Pain Location  Back    Pain Orientation  Lower    Pain Descriptors / Indicators  Radiating    Pain Type  Acute pain    Pain Onset  More than a month ago    Pain Frequency  Intermittent    Aggravating Factors   if on feet too long    Pain Relieving Factors  engaging the TA, Tylenols         [facility] PT Assessment - 12/06/19 0001      Palpation   SI assessment   right ilium rotated anteriorly, pubic bone equal but tender,                 Pelvic Floor Special Questions - 12/06/19 0001    Pelvic Floor Internal Exam  Patient confirms identification and approves PT to assess pelvic floor and treatment    Exam Type  Vaginal    Strength  fair squeeze, definite lift   better  circular contraction, VC to not contract with coccyx        [facility] Adult PT Treatment/Exercise - 12/06/19 0001      Lumbar Exercises: Supine   Other Supine Lumbar Exercises  abdominal contraction with alternate shoulder flexion; supine with pelvic floor contraction holding for 5 sec 10x      Lumbar Exercises: Prone   Other Prone Lumbar Exercises  transvers abdominus contraction with pelvic floor contraction holding for 5 sec 10x      Modalities   Modalities  Iontophoresis      Iontophoresis   Type of Iontophoresis  Dexamethasone    Location  right lower SI joint    Dose  1 ml, #1    Time  6      Manual Therapy   Manual Therapy  Soft tissue mobilization;Joint mobilization;Internal Pelvic Floor    Joint Mobilization  rotation of sacrum and correcting right sidebend; Pulling of right leg    Soft tissue mobilization  right SI joint    Internal Pelvic Floor  bil. sides of the levator ani and sphincter muscles while monitoring for pain    Muscle Energy Technique  correct right ilium  and left ilium             PT Education - 12/06/19 1658    Education Details  Access Code: [email or web address]; IONTOPHORESIS    Person(s) Educated  Patient    Methods  Explanation;Demonstration;Verbal cues;Handout    Comprehension  Verbalized understanding       PT Short Term Goals - 12/02/19 0937      PT SHORT TERM GOAL #1   Title  independent with initial HEP    Time  4    Period  Weeks    Status  Achieved      PT SHORT TERM GOAL #2   Title  pelvis stays in correct alignment for 4 weeks due to improved stability    Time  4    Period  Weeks    Status  On-going      PT SHORT TERM GOAL #3   Title  understand correct toileting techniques to improve constipation and ways to manage consitpation    Time  4    Period  Weeks    Status  On-going      PT SHORT TERM GOAL #4   Title  pain with movement in bed decreased >/= 25%    Time  4    Period  Weeks    Status  On-going    Target Date   12/06/19      PT SHORT TERM GOAL #5   Title  coccyx pain with sitting and bowel movements decreased >/= 25%    Time  4    Period  Weeks    Status  On-going    Target Date  12/06/19        PT Long Term Goals - 11/08/19 1612      PT LONG TERM GOAL #1   Title  independent with advanced HEP    Time  8    Period  Weeks    Status  New    Target Date  01/03/20      PT LONG TERM GOAL #2   Title  coccyx pain decreased >/= 75% with sitting    Time  8    Period  Weeks    Status  New    Target Date  01/03/20      PT LONG TERM GOAL #3   Title  diastasis recti decreases >/= 2 fingers width with good fascial tension    Time  4    Period  Weeks    Status  New    Target Date  01/03/20      PT LONG TERM GOAL #4   Title  sitting and standing with pain decreased >/= 75% due to improve stability of SI joint and lumbar area    Time  8    Period  Weeks    Status  New    Target Date  01/03/20      PT LONG TERM GOAL #5   Title  pain with    Baseline  feeling of vagina area feeling heavy and swollen decreased >/= 75% with standing to shop or house work due to improved pelvic floor strength    Time  8    Period  Weeks    Status  New    Target Date  01/03/20  Plan - 12/06/19 1619    Clinical Impression Statement  Patient ilium was rotated anteriorly and difficult to correct. Patient has an ionto patch on the lower SI joint to reduce the inflammation. Patient has tightness and trigger points in the pelvic floor. Patient was able to contract with a better circular contraction but not full max due ot coccyx pain. Patient has reduction of coccyx pain. Patient continues to wear SI belt to keep stabilty in joint. Patient is learning new core strength to not strain the SI joint. Patient will benefit from skilled therapy to improve strength, stabilization of the SI joint and reducing coccyx pain.    Personal Factors and Comorbidities  Comorbidity 1;Comorbidity 2    Comorbidities   IBS, diastasis Recti    Examination-Activity Limitations  Caring for Others;Stand;Toileting;Locomotion Level;Transfers;Bend;Sit    Examination-Participation Restrictions  Community Activity;Interpersonal Relationship    Stability/Clinical Decision Making  Evolving/Moderate complexity    Rehab Potential  Excellent    PT Frequency  2x / week    PT Duration  8 weeks    PT Treatment/Interventions  ADLs/Self Care Home Management;Biofeedback;Cryotherapy;Electrical Stimulation;Iontophoresis 4mg /ml Dexamethasone;Moist Heat;Ultrasound;Balance training;Therapeutic exercise;Therapeutic activities;Neuromuscular re-education;Patient/family education;Dry needling;Manual techniques;Taping;Spinal Manipulations    PT Next Visit Plan  assess the ionto patch, toileting, lower abdominal exercises with hip rotation    PT Home Exercise Plan  Access Code: [email or web address]    Consulted and Agree with Plan of Care  Patient       Patient will benefit from skilled therapeutic intervention in order to improve the following deficits and impairments:  Decreased coordination, Decreased range of motion, Increased fascial restricitons, Decreased endurance, Increased muscle spasms, Pain, Impaired flexibility, Decreased strength, Decreased mobility  Visit Diagnosis: Acute bilateral low back pain without sciatica  Coccyx pain  Muscle weakness (generalized)  Cramp and spasm     Problem List Patient Active Problem List   Diagnosis Date Noted  . Pregnant 09/15/2019    09/17/2019, PT 12/06/19 5:17 PM   [facility] [address] [place], [place], [place] Phone: [phone]   Fax:  [phone]  Name: Rockelle Heuerman MRN: Lauren Small Date of Birth: [birth year]

## 2019-12-08 ENCOUNTER — Ambulatory Visit: Payer: BC Managed Care – PPO | Admitting: Physical Therapy

## 2019-12-08 ENCOUNTER — Encounter: Payer: Self-pay | Admitting: Physical Therapy

## 2019-12-08 ENCOUNTER — Other Ambulatory Visit: Payer: Self-pay

## 2019-12-08 DIAGNOSIS — M533 Sacrococcygeal disorders, not elsewhere classified: Secondary | ICD-10-CM

## 2019-12-08 DIAGNOSIS — M545 Low back pain, unspecified: Secondary | ICD-10-CM

## 2019-12-08 DIAGNOSIS — R252 Cramp and spasm: Secondary | ICD-10-CM

## 2019-12-08 DIAGNOSIS — M6281 Muscle weakness (generalized): Secondary | ICD-10-CM | POA: Diagnosis not present

## 2019-12-08 NOTE — Therapy (Signed)
[facility] [address], [place], [place], [address] Phone: [phone]   Fax:  [phone]  Physical Therapy Treatment  Patient Details  Name: Lauren Small MRN: [id number] Date of Birth: Jul 22, 1986 Referring Provider (PT): Dr. Lucillie Garfinkel   Encounter Date: 12/08/2019  PT End of Session - 12/08/19 1233    Visit Number  6    Date for PT Re-Evaluation  01/03/20    Authorization Type  bcbs    PT Start Time  1230    PT Stop Time  1310    PT Time Calculation (min)  40 min    Activity Tolerance  Patient tolerated treatment well;No increased pain    Behavior During Therapy  WFL for tasks assessed/performed       Past Medical History:  Diagnosis Date  . Allergy   . Asthma   . Blood in stool   . GERD (gastroesophageal reflux disease)   . History of chicken pox     Past Surgical History:  Procedure Laterality Date  . urethra polyp    . WISDOM TOOTH EXTRACTION      There were no vitals filed for this visit.  Subjective Assessment - 12/08/19 1233    Subjective  The SI joint felt better and the ionto helped. The coccyx is bothering me.    Patient Stated Goals  reduce the tailbone pain, sacral pain    Pain Score  4     Pain Location  Coccyx    Pain Orientation  Mid    Pain Descriptors / Indicators  Sharp    Pain Type  Acute pain    Pain Onset  More than a month ago    Pain Frequency  Intermittent    Aggravating Factors   siting, standing too long, bowel movement    Pain Relieving Factors  right angle with sitting    Pain Score  7    Pain Location  Back    Pain Orientation  Lower    Pain Descriptors / Indicators  Radiating    Pain Type  Acute pain    Pain Onset  More than a month ago    Pain Frequency  Intermittent    Aggravating Factors   if on feet too long    Pain Relieving Factors  engaging the TA, Tylenols                       [facility] Adult PT Treatment/Exercise - 12/08/19 0001       Lumbar Exercises: Sidelying   Other Sidelying Lumbar Exercises  sidely trunk rotation with therapist assisting the rib movement      Lumbar Exercises: Prone   Straight Leg Raise  10 reps;1 second   both legs   Straight Leg Raises Limitations  tactile cues to engage the TA      Modalities   Modalities  Iontophoresis      Iontophoresis   Type of Iontophoresis  Dexamethasone    Location  right lower SI joint    Dose  1 ml, #1    Time  6      Manual Therapy   Manual Therapy  Joint mobilization    Joint Mobilization  PA and rotational mobilization to T5-L1; sidely gapping of the rib cage to improve mobility    Soft tissue mobilization  lumbar paraspinals, quadratus, right gluteal and SI joint       Trigger Point Dry Needling - 12/08/19 0001  Consent Given?  Yes    Education Handout Provided  Previously provided    Muscles Treated Back/Hip  Lumbar multifidi    Electrical Stimulation Performed with Dry Needling  Yes    E-stim with Dry Needling Details  to patient tolerance    Lumbar multifidi Response  Twitch response elicited;Palpable increased muscle length             PT Short Term Goals - 12/02/19 0102      PT SHORT TERM GOAL #1   Title  independent with initial HEP    Time  4    Period  Weeks    Status  Achieved      PT SHORT TERM GOAL #2   Title  pelvis stays in correct alignment for 4 weeks due to improved stability    Time  4    Period  Weeks    Status  On-going      PT SHORT TERM GOAL #3   Title  understand correct toileting techniques to improve constipation and ways to manage consitpation    Time  4    Period  Weeks    Status  On-going      PT SHORT TERM GOAL #4   Title  pain with movement in bed decreased >/= 25%    Time  4    Period  Weeks    Status  On-going    Target Date  12/06/19      PT SHORT TERM GOAL #5   Title  coccyx pain with sitting and bowel movements decreased >/= 25%    Time  4    Period  Weeks    Status  On-going     Target Date  12/06/19        PT Long Term Goals - 11/08/19 1612      PT LONG TERM GOAL #1   Title  independent with advanced HEP    Time  8    Period  Weeks    Status  New    Target Date  01/03/20      PT LONG TERM GOAL #2   Title  coccyx pain decreased >/= 75% with sitting    Time  8    Period  Weeks    Status  New    Target Date  01/03/20      PT LONG TERM GOAL #3   Title  diastasis recti decreases >/= 2 fingers width with good fascial tension    Time  4    Period  Weeks    Status  New    Target Date  01/03/20      PT LONG TERM GOAL #4   Title  sitting and standing with pain decreased >/= 75% due to improve stability of SI joint and lumbar area    Time  8    Period  Weeks    Status  New    Target Date  01/03/20      PT LONG TERM GOAL #5   Title  pain with    Baseline  feeling of vagina area feeling heavy and swollen decreased >/= 75% with standing to shop or house work due to improved pelvic floor strength    Time  8    Period  Weeks    Status  New    Target Date  01/03/20            Plan - 12/08/19 1322    Clinical Impression Statement  Patient pelvis was in correct alignment today for the first time. Patient had muscle twitching in the lumbar paraspinals. Patient does well with the inotophoresis. Patient is learning how to strengthen her lumbar multfidi to work on back strength. Patient felt better after therapy. Patient will benefit from skilled therapy to improve strength , stabilization of the SI joint and reducing the coccyx pain.    Personal Factors and Comorbidities  Comorbidity 1;Comorbidity 2    Comorbidities  IBS, diastasis Recti    Examination-Activity Limitations  Caring for Others;Stand;Toileting;Locomotion Level;Transfers;Bend;Sit    Examination-Participation Restrictions  Community Activity;Interpersonal Relationship    Stability/Clinical Decision Making  Evolving/Moderate complexity    Rehab Potential  Excellent    PT Frequency  2x / week     PT Duration  8 weeks    PT Treatment/Interventions  ADLs/Self Care Home Management;Biofeedback;Cryotherapy;Electrical Stimulation;Iontophoresis 4mg /ml Dexamethasone;Moist Heat;Ultrasound;Balance training;Therapeutic exercise;Therapeutic activities;Neuromuscular re-education;Patient/family education;Dry needling;Manual techniques;Taping;Spinal Manipulations    PT Next Visit Plan  ionto patch, toileting, lower abdominal exercises with hip rotation; work on quadruped with core contraction and movement    PT Home Exercise Plan  Access Code: [email or web address]    Consulted and Agree with Plan of Care  Patient       Patient will benefit from skilled therapeutic intervention in order to improve the following deficits and impairments:  Decreased coordination, Decreased range of motion, Increased fascial restricitons, Decreased endurance, Increased muscle spasms, Pain, Impaired flexibility, Decreased strength, Decreased mobility  Visit Diagnosis: Coccyx pain  Acute bilateral low back pain without sciatica  Muscle weakness (generalized)  Cramp and spasm     Problem List Patient Active Problem List   Diagnosis Date Noted  . Pregnant 09/15/2019    Eulis Fosterheryl Sherlonda Flater, PT 12/08/19 1:26 PM   [facility] [address], [address] [place], [place], [address] Phone: [phone]   Fax:  [phone]  Name: Lysle MoralesKimberly Pund MRN: [id number] Date of Birth: [birth year]

## 2019-12-14 ENCOUNTER — Other Ambulatory Visit: Payer: Self-pay

## 2019-12-14 ENCOUNTER — Encounter: Payer: Self-pay | Admitting: Physical Therapy

## 2019-12-14 ENCOUNTER — Ambulatory Visit: Payer: BC Managed Care – PPO | Admitting: Physical Therapy

## 2019-12-14 DIAGNOSIS — M533 Sacrococcygeal disorders, not elsewhere classified: Secondary | ICD-10-CM | POA: Diagnosis not present

## 2019-12-14 DIAGNOSIS — R252 Cramp and spasm: Secondary | ICD-10-CM

## 2019-12-14 DIAGNOSIS — M6281 Muscle weakness (generalized): Secondary | ICD-10-CM

## 2019-12-14 DIAGNOSIS — M545 Low back pain, unspecified: Secondary | ICD-10-CM

## 2019-12-14 NOTE — Therapy (Signed)
[facility] [address], [address] [place], [place], [address] Phone: [phone]   Fax:  [phone]  Physical Therapy Treatment  Patient Details  Name: Lauren Small MRN: [id number] Date of Birth: 10/16/1986 Referring Provider (PT): Dr. Belva Agee   Encounter Date: 12/14/2019  PT End of Session - 12/14/19 1705    Visit Number  7    Date for PT Re-Evaluation  01/03/20    Authorization Type  bcbs    PT Start Time  1615    PT Stop Time  1700    PT Time Calculation (min)  45 min    Activity Tolerance  Patient tolerated treatment well;No increased pain    Behavior During Therapy  WFL for tasks assessed/performed       Past Medical History:  Diagnosis Date  . Allergy   . Asthma   . Blood in stool   . GERD (gastroesophageal reflux disease)   . History of chicken pox     Past Surgical History:  Procedure Laterality Date  . urethra polyp    . WISDOM TOOTH EXTRACTION      There were no vitals filed for this visit.  Subjective Assessment - 12/14/19 1615    Subjective  I forget about wearing my SI belt due to feeling better. Patient hsa most pain with standing. Ionto is helping the pain    Patient Stated Goals  reduce the tailbone pain, sacral pain    Currently in Pain?  Yes    Pain Score  6     Pain Location  Coccyx    Pain Orientation  Mid    Pain Descriptors / Indicators  Shooting    Pain Type  Acute pain    Pain Onset  More than a month ago    Pain Frequency  Intermittent    Aggravating Factors   sitting leaning back    Pain Relieving Factors  flexed more at hips    Pain Score  8    Pain Location  Back    Pain Orientation  Lower    Pain Descriptors / Indicators  Radiating    Pain Type  Acute pain    Pain Onset  More than a month ago    Pain Frequency  Intermittent    Aggravating Factors   on feet to long    Pain Relieving Factors  engaging the TA                    Pelvic Floor  Special Questions - 12/14/19 0001    Pelvic Floor Internal Exam  Patient confirms identification and approves PT to assess pelvic floor and treatment    Exam Type  Vaginal        [facility] Adult PT Treatment/Exercise - 12/14/19 0001      Therapeutic Activites    Therapeutic Activities  Other Therapeutic Activities    Other Therapeutic Activities  squatting keeping spinal neutral and engaging the lower abdominals correctly to decrease the increasee activity of the lumbar paraspinals      Lumbar Exercises: Standing   Functional Squats  10 reps    Functional Squats Limitations  holding 10# keeping spinal neutral    Other Standing Lumbar Exercises  stand on one leg and move the other leg to the back and side with tactile cues to engage the lower abdominals for stability    Other Standing Lumbar Exercises  modified burpees with suggestion on not flexing the hips as  much      Modalities   Modalities  Iontophoresis      Iontophoresis   Type of Iontophoresis  Dexamethasone    Location  right lower SI joint    Dose  1 ml, #3    Time  6      Manual Therapy   Manual Therapy  Taping;Internal Pelvic Floor    Internal Pelvic Floor  bil. levator ani, puborectalis, sides of the coccyx    Kinesiotex  Inhibit Muscle;Facilitate Muscle      Kinesiotix   Inhibit Muscle   the lumbar paraspinals    Facilitate Muscle   lower abdominals             PT Education - 12/14/19 1704    Education Details  going over her workout with suggestions on correct posture and using lower weights    Person(s) Educated  Patient    Methods  Explanation;Demonstration    Comprehension  Verbalized understanding;Returned demonstration       PT Short Term Goals - 12/14/19 1709      PT SHORT TERM GOAL #1   Title  independent with initial HEP    Time  4    Period  Weeks    Status  Achieved      PT SHORT TERM GOAL #2   Title  pelvis stays in correct alignment for 4 weeks due to improved stability    Time  4     Period  Weeks    Status  On-going      PT SHORT TERM GOAL #3   Title  understand correct toileting techniques to improve constipation and ways to manage consitpation    Time  4    Period  Weeks    Status  On-going    Target Date  12/06/19      PT SHORT TERM GOAL #4   Title  pain with movement in bed decreased >/= 25%    Time  4    Period  Weeks    Status  On-going      PT SHORT TERM GOAL #5   Title  coccyx pain with sitting and bowel movements decreased >/= 25%    Time  4    Period  Weeks    Status  On-going        PT Long Term Goals - 11/08/19 1612      PT LONG TERM GOAL #1   Title  independent with advanced HEP    Time  8    Period  Weeks    Status  New    Target Date  01/03/20      PT LONG TERM GOAL #2   Title  coccyx pain decreased >/= 75% with sitting    Time  8    Period  Weeks    Status  New    Target Date  01/03/20      PT LONG TERM GOAL #3   Title  diastasis recti decreases >/= 2 fingers width with good fascial tension    Time  4    Period  Weeks    Status  New    Target Date  01/03/20      PT LONG TERM GOAL #4   Title  sitting and standing with pain decreased >/= 75% due to improve stability of SI joint and lumbar area    Time  8    Period  Weeks    Status  New  Target Date  01/03/20      PT LONG TERM GOAL #5   Title  pain with    Baseline  feeling of vagina area feeling heavy and swollen decreased >/= 75% with standing to shop or house work due to improved pelvic floor strength    Time  8    Period  Weeks    Status  New    Target Date  01/03/20            Plan - 12/14/19 1614    Clinical Impression Statement  Patient is able to do more of her routine for exercise. We reviewed her exercise program and talked about spinal neutral, engaging the lower abdominals, not over contracting the lumbar paraspinals. Patient has no pain in the coccyx are after the manual work. Patient continues to need the ionto on the right side of coccyx/SI  area. Patient is using kinesio tape on lumbar to inhibit muscles and on the lower abdominals to increase contraction. Patient will go several hours without her SI belt due to feeling better. Patient pelvic floor strength is 3/5. Patient will benefit from skilled therapy to improve strength, stabilizaiton of the SI joint and reducing the coccyx pain.    Personal Factors and Comorbidities  Comorbidity 1;Comorbidity 2    Comorbidities  IBS, diastasis Recti    Examination-Activity Limitations  Caring for Others;Stand;Toileting;Locomotion Level;Transfers;Bend;Sit    Examination-Participation Restrictions  Community Activity;Interpersonal Relationship    Stability/Clinical Decision Making  Evolving/Moderate complexity    Rehab Potential  Excellent    PT Frequency  2x / week    PT Duration  8 weeks    PT Treatment/Interventions  ADLs/Self Care Home Management;Biofeedback;Cryotherapy;Electrical Stimulation;Iontophoresis 4mg /ml Dexamethasone;Moist Heat;Ultrasound;Balance training;Therapeutic exercise;Therapeutic activities;Neuromuscular re-education;Patient/family education;Dry needling;Manual techniques;Taping;Spinal Manipulations    PT Next Visit Plan  ionto patch, toileting, lower abdominal exercises with hip rotation; work on quadruped with core contraction and movement; see if the kinesio tape is helping    PT Home Exercise Plan  Access Code: [email or web address]    Consulted and Agree with Plan of Care  Patient       Patient will benefit from skilled therapeutic intervention in order to improve the following deficits and impairments:  Decreased coordination, Decreased range of motion, Increased fascial restricitons, Decreased endurance, Increased muscle spasms, Pain, Impaired flexibility, Decreased strength, Decreased mobility  Visit Diagnosis: Coccyx pain  Acute bilateral low back pain without sciatica  Muscle weakness (generalized)  Cramp and spasm     Problem List Patient Active Problem List    Diagnosis Date Noted  . Pregnant 09/15/2019    Eulis Fosterheryl Hicks Feick, PT 12/14/19 5:11 PM    [facility] [address], [address] [place], [place], [address] Phone: [phone]   Fax:  [phone]  Name: Lauren Small MRN: [id number] Date of Birth: 03-27-1986

## 2019-12-21 ENCOUNTER — Encounter: Payer: Self-pay | Admitting: Physical Therapy

## 2019-12-21 ENCOUNTER — Ambulatory Visit: Payer: BC Managed Care – PPO | Admitting: Physical Therapy

## 2019-12-21 ENCOUNTER — Other Ambulatory Visit: Payer: Self-pay

## 2019-12-21 DIAGNOSIS — M6281 Muscle weakness (generalized): Secondary | ICD-10-CM

## 2019-12-21 DIAGNOSIS — M545 Low back pain, unspecified: Secondary | ICD-10-CM

## 2019-12-21 DIAGNOSIS — M533 Sacrococcygeal disorders, not elsewhere classified: Secondary | ICD-10-CM

## 2019-12-21 DIAGNOSIS — R252 Cramp and spasm: Secondary | ICD-10-CM | POA: Diagnosis not present

## 2019-12-21 NOTE — Therapy (Signed)
[facility] [address] [place], [place], [address] Phone: [phone]   Fax:  [phone]  Physical Therapy Treatment  Patient Details  Name: Lauren Small MRN: [id number] Date of Birth: [birth year] Referring Provider (PT): Dr. Lucillie Garfinkel   Encounter Date: 12/21/2019  PT End of Session - 12/21/19 1703    Visit Number  8    Date for PT Re-Evaluation  01/03/20    Authorization Type  bcbs    PT Start Time  1615    PT Stop Time  1700    PT Time Calculation (min)  45 min    Activity Tolerance  Patient tolerated treatment well;No increased pain    Behavior During Therapy  WFL for tasks assessed/performed       Past Medical History:  Diagnosis Date  . Allergy   . Asthma   . Blood in stool   . GERD (gastroesophageal reflux disease)   . History of chicken pox     Past Surgical History:  Procedure Laterality Date  . urethra polyp    . WISDOM TOOTH EXTRACTION      There were no vitals filed for this visit.  Subjective Assessment - 12/21/19 1618    Subjective  My back feels better with squats. My back into shoulder blade is tight and burning. The symphysis pubic bone is worse and feels like a lightening bolt comes. Sometimes the pain is internal and external pain.    Patient Stated Goals  reduce the tailbone pain, sacral pain    Currently in Pain?  Yes    Pain Score  6     Pain Location  Coccyx    Pain Orientation  Mid    Pain Descriptors / Indicators  Shooting    Pain Type  Acute pain    Pain Onset  More than a month ago    Pain Frequency  Intermittent    Aggravating Factors   sitting leaning back    Pain Relieving Factors  flexed more at hips    Pain Score  7    Pain Location  Back    Pain Orientation  Upper;Mid;Lower    Pain Descriptors / Indicators  Radiating    Pain Type  Acute pain    Pain Onset  More than a month ago    Pain Frequency  Intermittent    Aggravating Factors   on feet to long     Pain Relieving Factors  engaging         [facility] PT Assessment - 12/21/19 0001      Palpation   SI assessment   right ilium rotated anteriorly, pubic bone equal but tender,                 Pelvic Floor Special Questions - 12/21/19 0001    Diastasis Recti  2.5 fingers         [facility] Adult PT Treatment/Exercise - 12/21/19 0001      Neuro Re-ed    Neuro Re-ed Details   lifting head with abdominal contraction to reduce the diastasis recti      Lumbar Exercises: Supine   Heel Slides  10 reps    Heel Slides Limitations  each side with tactile cues to contract the lower abdominal    Other Supine Lumbar Exercises  hookly with ball squeeze alternate shoulder flexion, alternate heel raise, moving arms side to tside,       Modalities   Modalities  Iontophoresis  Iontophoresis   Type of Iontophoresis  Dexamethasone    Location  right lower SI joint    Dose  1 ml, #4    Time  6      Manual Therapy   Manual Therapy  Soft tissue mobilization;Joint mobilization;Muscle Energy Technique    Joint Mobilization  PA and rotational mobilization to T3-L5    Soft tissue mobilization  gluteal, coccygeus, piriformis    Muscle Energy Technique  correct right ilium  and left ilium               PT Short Term Goals - 12/14/19 1709      PT SHORT TERM GOAL #1   Title  independent with initial HEP    Time  4    Period  Weeks    Status  Achieved      PT SHORT TERM GOAL #2   Title  pelvis stays in correct alignment for 4 weeks due to improved stability    Time  4    Period  Weeks    Status  On-going      PT SHORT TERM GOAL #3   Title  understand correct toileting techniques to improve constipation and ways to manage consitpation    Time  4    Period  Weeks    Status  On-going    Target Date  12/06/19      PT SHORT TERM GOAL #4   Title  pain with movement in bed decreased >/= 25%    Time  4    Period  Weeks    Status  On-going      PT SHORT TERM GOAL #5    Title  coccyx pain with sitting and bowel movements decreased >/= 25%    Time  4    Period  Weeks    Status  On-going        PT Long Term Goals - 11/08/19 1612      PT LONG TERM GOAL #1   Title  independent with advanced HEP    Time  8    Period  Weeks    Status  New    Target Date  01/03/20      PT LONG TERM GOAL #2   Title  coccyx pain decreased >/= 75% with sitting    Time  8    Period  Weeks    Status  New    Target Date  01/03/20      PT LONG TERM GOAL #3   Title  diastasis recti decreases >/= 2 fingers width with good fascial tension    Time  4    Period  Weeks    Status  New    Target Date  01/03/20      PT LONG TERM GOAL #4   Title  sitting and standing with pain decreased >/= 75% due to improve stability of SI joint and lumbar area    Time  8    Period  Weeks    Status  New    Target Date  01/03/20      PT LONG TERM GOAL #5   Title  pain with    Baseline  feeling of vagina area feeling heavy and swollen decreased >/= 75% with standing to shop or house work due to improved pelvic floor strength    Time  8    Period  Weeks    Status  New    Target Date  01/03/20  Plan - 12/21/19 1703    Clinical Impression Statement  Patient pelvis was in correct alignment after manual work. Patient continues to have a diastasis recti and needs help to engage the abdominal muscles correctly. Patient does have increased tension in the diastasis recti. Patient needs tactile cues to engage the lower abdominals with arm and leg movements. Patient will work out several days in a row and discussed for her to rest inbetween due to muscle fatique. Today was the first day she had soreness in the abdominal muscles due to her engaging them better with tactile cues. Patient continues to wear a SI belt. Patient will benefit from skilled therapy to improve strength, stabilization of the SI joint, and reducing the coccyx pain.    Personal Factors and Comorbidities  Comorbidity  1;Comorbidity 2    Comorbidities  IBS, diastasis Recti    Examination-Activity Limitations  Caring for Others;Stand;Toileting;Locomotion Level;Transfers;Bend;Sit    Examination-Participation Restrictions  Community Activity;Interpersonal Relationship    Stability/Clinical Decision Making  Evolving/Moderate complexity    Rehab Potential  Excellent    PT Frequency  2x / week    PT Duration  8 weeks    PT Treatment/Interventions  ADLs/Self Care Home Management;Biofeedback;Cryotherapy;Electrical Stimulation;Iontophoresis 4mg /ml Dexamethasone;Moist Heat;Ultrasound;Balance training;Therapeutic exercise;Therapeutic activities;Neuromuscular re-education;Patient/family education;Dry needling;Manual techniques;Taping;Spinal Manipulations    PT Next Visit Plan  ionto patch, toileting, lower abdominal exercises with hip rotation; work on quadruped with core contraction and movement; update HEP    PT Home Exercise Plan  Access Code: [email or web address]    Consulted and Agree with Plan of Care  Patient       Patient will benefit from skilled therapeutic intervention in order to improve the following deficits and impairments:  Decreased coordination, Decreased range of motion, Increased fascial restricitons, Decreased endurance, Increased muscle spasms, Pain, Impaired flexibility, Decreased strength, Decreased mobility  Visit Diagnosis: Coccyx pain  Acute bilateral low back pain without sciatica  Muscle weakness (generalized)  Cramp and spasm     Problem List Patient Active Problem List   Diagnosis Date Noted  . Pregnant 09/15/2019    09/17/2019, PT 12/21/19 5:08 PM    [facility] [address], [address] [place], [place], [place] Phone: [phone]   Fax:  [phone]  Name: Lauren Small MRN: Lysle Morales Date of Birth: [birth year]

## 2019-12-22 ENCOUNTER — Ambulatory Visit: Payer: BC Managed Care – PPO | Admitting: Physical Therapy

## 2019-12-22 ENCOUNTER — Encounter: Payer: Self-pay | Admitting: Physical Therapy

## 2019-12-22 DIAGNOSIS — M545 Low back pain, unspecified: Secondary | ICD-10-CM

## 2019-12-22 DIAGNOSIS — R252 Cramp and spasm: Secondary | ICD-10-CM

## 2019-12-22 DIAGNOSIS — M533 Sacrococcygeal disorders, not elsewhere classified: Secondary | ICD-10-CM | POA: Diagnosis not present

## 2019-12-22 DIAGNOSIS — M6281 Muscle weakness (generalized): Secondary | ICD-10-CM

## 2019-12-22 NOTE — Therapy (Signed)
[facility] [address], [address] [place], [place], [address] Phone: [phone]   Fax:  [phone]  Physical Therapy Treatment  Patient Details  Name: Lauren MoralesKimberly Small MRN: [id number] Date of Birth: Oct 25, 1986 Referring Provider (PT): Dr. Belva AgeeElise Leger   Encounter Date: 12/22/2019  PT End of Session - 12/22/19 1326    Visit Number  9    Date for PT Re-Evaluation  01/03/20    Authorization Type  bcbs    PT Start Time  1230    PT Stop Time  1315    PT Time Calculation (min)  45 min    Activity Tolerance  Patient tolerated treatment well;No increased pain    Behavior During Therapy  WFL for tasks assessed/performed       Past Medical History:  Diagnosis Date  . Allergy   . Asthma   . Blood in stool   . GERD (gastroesophageal reflux disease)   . History of chicken pox     Past Surgical History:  Procedure Laterality Date  . urethra polyp    . WISDOM TOOTH EXTRACTION      There were no vitals filed for this visit.  Subjective Assessment - 12/22/19 1232    Subjective  My coccyx is still bothering me and the hips. My ribs feel better.    Patient Stated Goals  reduce the tailbone pain, sacral pain    Currently in Pain?  Yes    Pain Score  6     Pain Location  Coccyx    Pain Orientation  Mid    Pain Descriptors / Indicators  Shooting    Pain Type  Acute pain    Pain Onset  More than a month ago    Pain Frequency  Intermittent    Aggravating Factors   sitting leaning back    Pain Relieving Factors  flexed more at hips    Multiple Pain Sites  Yes    Pain Score  6    Pain Location  Back    Pain Orientation  Upper;Mid;Lower    Pain Descriptors / Indicators  Radiating    Pain Type  Acute pain    Pain Onset  More than a month ago    Pain Frequency  Intermittent    Aggravating Factors   on feet too long    Pain Relieving Factors  engaging                       [facility] Adult PT Treatment/Exercise  - 12/22/19 0001      Self-Care   Self-Care  Other Self-Care Comments    Other Self-Care Comments   how to use a tennis ball to massage the coccyx muscles and lumbar in sitting and standing      Neuro Re-ed    Neuro Re-ed Details   supine abdominal contraction with facilitation manually to engage the muscles and reduce the diastasis recti.       Manual Therapy   Manual Therapy  Soft tissue mobilization;Taping;Myofascial release    Soft tissue mobilization  lumbar paraspinals, quadruped rocking back and forth with tissue release to the lumbar paraspinals, quadruped release the lateral trunk fascial while going forward to the abdomen    Myofascial Release  fascial release to the lower abdomen    Kinesiotex  Facilitate Muscle      Kinesiotix   Facilitate Muscle   lumbar paraspinals, obliques external and internal and external obliques and deep  core       Trigger Point Dry Needling - 12/22/19 0001    Consent Given?  Yes    Education Handout Provided  Previously provided    Muscles Treated Back/Hip  Lumbar multifidi;Quadratus lumborum    Lumbar multifidi Response  Twitch response elicited;Palpable increased muscle length    Quadratus Lumborum Response  Twitch response elicited;Palpable increased muscle length           PT Education - 12/22/19 1325    Education Details  gave patient Kinsiotape to tape her abdomen incase the tape I put on came off; instructed patient on how to tape the abdomen to engage the  [employer]) Educated  Patient    Methods  Explanation;Demonstration;Other (comment)   took a picture from her phone   Comprehension  Verbalized understanding       PT Short Term Goals - 12/14/19 1709      PT SHORT TERM GOAL #1   Title  independent with initial HEP    Time  4    Period  Weeks    Status  Achieved      PT SHORT TERM GOAL #2   Title  pelvis stays in correct alignment for 4 weeks due to improved stability    Time  4    Period  Weeks    Status   On-going      PT SHORT TERM GOAL #3   Title  understand correct toileting techniques to improve constipation and ways to manage consitpation    Time  4    Period  Weeks    Status  On-going    Target Date  12/06/19      PT SHORT TERM GOAL #4   Title  pain with movement in bed decreased >/= 25%    Time  4    Period  Weeks    Status  On-going      PT SHORT TERM GOAL #5   Title  coccyx pain with sitting and bowel movements decreased >/= 25%    Time  4    Period  Weeks    Status  On-going        PT Long Term Goals - 11/08/19 1612      PT LONG TERM GOAL #1   Title  independent with advanced HEP    Time  8    Period  Weeks    Status  New    Target Date  01/03/20      PT LONG TERM GOAL #2   Title  coccyx pain decreased >/= 75% with sitting    Time  8    Period  Weeks    Status  New    Target Date  01/03/20      PT LONG TERM GOAL #3   Title  diastasis recti decreases >/= 2 fingers width with good fascial tension    Time  4    Period  Weeks    Status  New    Target Date  01/03/20      PT LONG TERM GOAL #4   Title  sitting and standing with pain decreased >/= 75% due to improve stability of SI joint and lumbar area    Time  8    Period  Weeks    Status  New    Target Date  01/03/20      PT LONG TERM GOAL #5   Title  pain with    Baseline  feeling of vagina area feeling heavy and swollen decreased >/= 75% with standing to shop or house work due to improved pelvic floor strength    Time  8    Period  Weeks    Status  New    Target Date  01/03/20            Plan - 12/22/19 1327    Clinical Impression Statement  Patient has a 1 finger width diastasis recti above and below the umbilicus after manual work and facilitation of the muscles. Patient Lumbar paraspinals were tight and after the manual work were looser. Patient is able to bring her lower rib cage downward with abdominal contraction. Patient was educated on how to use a tennis ball to release around the  lumbar paraspinals and coccyx bone. Patient will benefit from skilled therapy to improve strength, stabilization of the SI joint and reducing the coccyx pain.    Personal Factors and Comorbidities  Comorbidity 1;Comorbidity 2    Comorbidities  IBS, diastasis Recti    Examination-Activity Limitations  Caring for Others;Stand;Toileting;Locomotion Level;Transfers;Bend;Sit    Examination-Participation Restrictions  Community Activity;Interpersonal Relationship    Stability/Clinical Decision Making  Evolving/Moderate complexity    Rehab Potential  Excellent    PT Frequency  2x / week    PT Duration  8 weeks    PT Treatment/Interventions  ADLs/Self Care Home Management;Biofeedback;Cryotherapy;Electrical Stimulation;Iontophoresis 4mg /ml Dexamethasone;Moist Heat;Ultrasound;Balance training;Therapeutic exercise;Therapeutic activities;Neuromuscular re-education;Patient/family education;Dry needling;Manual techniques;Taping;Spinal Manipulations    PT Next Visit Plan  ionto patch, toileting, lower abdominal exercises with hip rotation; work on quadruped with core contraction and movement; update HEP; see if the taping has helped    PT Home Exercise Plan  Access Code: [email or web address]    Consulted and Agree with Plan of Care  Patient       Patient will benefit from skilled therapeutic intervention in order to improve the following deficits and impairments:  Decreased coordination, Decreased range of motion, Increased fascial restricitons, Decreased endurance, Increased muscle spasms, Pain, Impaired flexibility, Decreased strength, Decreased mobility  Visit Diagnosis: Coccyx pain  Acute bilateral low back pain without sciatica  Muscle weakness (generalized)  Cramp and spasm     Problem List Patient Active Problem List   Diagnosis Date Noted  . Pregnant 09/15/2019    Earlie Counts, PT 12/22/19 1:32 PM   [facility] [address], [place], [place], [address] Phone: [phone]   Fax:  [phone]  Name: Lauren Small MRN: [id number] Date of Birth: December 12, 1986

## 2019-12-27 ENCOUNTER — Other Ambulatory Visit: Payer: Self-pay

## 2019-12-27 ENCOUNTER — Encounter: Payer: Self-pay | Admitting: Physical Therapy

## 2019-12-27 ENCOUNTER — Ambulatory Visit: Payer: BC Managed Care – PPO | Attending: Obstetrics and Gynecology | Admitting: Physical Therapy

## 2019-12-27 DIAGNOSIS — M6281 Muscle weakness (generalized): Secondary | ICD-10-CM

## 2019-12-27 DIAGNOSIS — M545 Low back pain, unspecified: Secondary | ICD-10-CM

## 2019-12-27 DIAGNOSIS — M533 Sacrococcygeal disorders, not elsewhere classified: Secondary | ICD-10-CM

## 2019-12-27 DIAGNOSIS — R252 Cramp and spasm: Secondary | ICD-10-CM | POA: Diagnosis not present

## 2019-12-27 NOTE — Therapy (Signed)
[facility] [address] [place], [place], [address] Phone: [phone]   Fax:  [phone]  Physical Therapy Treatment  Patient Details  Name: Lauren Small MRN: [id number] Date of Birth: 01/01/1986 Referring Provider (PT): Dr. Belva Agee   Encounter Date: 12/27/2019  PT End of Session - 12/27/19 1710    Visit Number  10    Date for PT Re-Evaluation  01/03/20    Authorization Type  bcbs    PT Start Time  1615    PT Stop Time  1700    PT Time Calculation (min)  45 min    Activity Tolerance  Patient tolerated treatment well;No increased pain    Behavior During Therapy  WFL for tasks assessed/performed       Past Medical History:  Diagnosis Date  . Allergy   . Asthma   . Blood in stool   . GERD (gastroesophageal reflux disease)   . History of chicken pox     Past Surgical History:  Procedure Laterality Date  . urethra polyp    . WISDOM TOOTH EXTRACTION      There were no vitals filed for this visit.  Subjective Assessment - 12/27/19 1615    Subjective  I am able to contract the abdominals. The coccyx, SI and back is the same. I measured my waist and went down 2 inches.    Patient Stated Goals  reduce the tailbone pain, sacral pain    Currently in Pain?  Yes    Pain Score  7     Pain Location  Coccyx    Pain Orientation  Mid    Pain Descriptors / Indicators  Shooting    Pain Type  Acute pain    Pain Onset  More than a month ago    Pain Frequency  Intermittent    Aggravating Factors   sitting leaning back    Pain Relieving Factors  flexed more at hips    Multiple Pain Sites  Yes    Pain Score  8    Pain Location  Back    Pain Orientation  Upper;Lower;Mid    Pain Descriptors / Indicators  Radiating    Pain Type  Acute pain    Pain Onset  More than a month ago    Pain Frequency  Intermittent    Aggravating Factors   on feet too long    Pain Relieving Factors  engaging                     Pelvic Floor Special Questions - 12/27/19 0001    Pelvic Floor Internal Exam  Patient confirms identification and approves PT to assess pelvic floor and treatment    Exam Type  Vaginal    Strength  fair squeeze, definite lift   circular   Strength # of seconds  5        [facility] Adult PT Treatment/Exercise - 12/27/19 0001      Lumbar Exercises: Stretches   Quadruped Mid Back Stretch  2 reps;30 seconds    Quadruped Mid Back Stretch Limitations  therapist moilization of the left pelvic floor muscles      Lumbar Exercises: Quadruped   Single Arm Raise  Right;Left;5 reps    Single Arm Raise Weights (lbs)  deep core bracing; hips IR to turn off the hip muscles and reduce coocyx pain    Other Quadruped Lumbar Exercises  abdominal bracing hold for 5 sec 5 times;  Manual Therapy   Manual Therapy  Internal Pelvic Floor;Joint mobilization;Soft tissue mobilization    Joint Mobilization  T11-L3 gapping on the right and left to mobilize the joint    Soft tissue mobilization  bilateral quadratus to release the muscle     Internal Pelvic Floor  sides of the coccyx, levator ani, left ATLA, on               PT Short Term Goals - 12/14/19 1709      PT SHORT TERM GOAL #1   Title  independent with initial HEP    Time  4    Period  Weeks    Status  Achieved      PT SHORT TERM GOAL #2   Title  pelvis stays in correct alignment for 4 weeks due to improved stability    Time  4    Period  Weeks    Status  On-going      PT SHORT TERM GOAL #3   Title  understand correct toileting techniques to improve constipation and ways to manage consitpation    Time  4    Period  Weeks    Status  On-going    Target Date  12/06/19      PT SHORT TERM GOAL #4   Title  pain with movement in bed decreased >/= 25%    Time  4    Period  Weeks    Status  On-going      PT SHORT TERM GOAL #5   Title  coccyx pain with sitting and bowel movements decreased >/= 25%     Time  4    Period  Weeks    Status  On-going        PT Long Term Goals - 11/08/19 1612      PT LONG TERM GOAL #1   Title  independent with advanced HEP    Time  8    Period  Weeks    Status  New    Target Date  01/03/20      PT LONG TERM GOAL #2   Title  coccyx pain decreased >/= 75% with sitting    Time  8    Period  Weeks    Status  New    Target Date  01/03/20      PT LONG TERM GOAL #3   Title  diastasis recti decreases >/= 2 fingers width with good fascial tension    Time  4    Period  Weeks    Status  New    Target Date  01/03/20      PT LONG TERM GOAL #4   Title  sitting and standing with pain decreased >/= 75% due to improve stability of SI joint and lumbar area    Time  8    Period  Weeks    Status  New    Target Date  01/03/20      PT LONG TERM GOAL #5   Title  pain with    Baseline  feeling of vagina area feeling heavy and swollen decreased >/= 75% with standing to shop or house work due to improved pelvic floor strength    Time  8    Period  Weeks    Status  New    Target Date  01/03/20            Plan - 12/27/19 1618    Clinical Impression Statement  After therapy patient  had no coccyx pain. Patient would have referral of coccyx pain with mobilization of T11-L3 and working on the quadratus refer pain to the coccyx. Patient lower abdominal fatique with bracing in quadruped. Patient will get some coccyx pain with quadruped left one extremity. Patient was able to rotate her spine fully after therapy. Patient was able to go from supine to sit with greater ease. Patient is able to feel her abdominals contract better using the kinesiotape. Patient will benefit from skilled therapy to improve strength, stabilization of the SI joint and reducing the coccyx pain.    Personal Factors and Comorbidities  Comorbidity 1;Comorbidity 2    Comorbidities  IBS, diastasis Recti    Examination-Activity Limitations  Caring for Others;Stand;Toileting;Locomotion  Level;Transfers;Bend;Sit    Examination-Participation Restrictions  Community Activity;Interpersonal Relationship    Stability/Clinical Decision Making  Evolving/Moderate complexity    Rehab Potential  Excellent    PT Frequency  2x / week    PT Duration  8 weeks    PT Treatment/Interventions  ADLs/Self Care Home Management;Biofeedback;Cryotherapy;Electrical Stimulation;Iontophoresis 4mg /ml Dexamethasone;Moist Heat;Ultrasound;Balance training;Therapeutic exercise;Therapeutic activities;Neuromuscular re-education;Patient/family education;Dry needling;Manual techniques;Taping;Spinal Manipulations    PT Next Visit Plan  ionto patch, toileting, lower abdominal exercises with hip rotation; work on quadruped with core contraction and movement; update HEP; mobilize the TL junction    PT Home Exercise Plan  Access Code: [email or web address]    Consulted and Agree with Plan of Care  Patient       Patient will benefit from skilled therapeutic intervention in order to improve the following deficits and impairments:  Decreased coordination, Decreased range of motion, Increased fascial restricitons, Decreased endurance, Increased muscle spasms, Pain, Impaired flexibility, Decreased strength, Decreased mobility  Visit Diagnosis: Coccyx pain  Acute bilateral low back pain without sciatica  Muscle weakness (generalized)  Cramp and spasm     Problem List Patient Active Problem List   Diagnosis Date Noted  . Pregnant 09/15/2019    Earlie Counts, PT 12/27/19 5:16 PM   [facility] [address], [place], [place], [address] Phone: [phone]   Fax:  [phone]  Name: Lauren Small MRN: [id number] Date of Birth: 1986/03/19

## 2019-12-29 ENCOUNTER — Ambulatory Visit: Payer: BC Managed Care – PPO | Admitting: Physical Therapy

## 2019-12-29 ENCOUNTER — Encounter: Payer: Self-pay | Admitting: Physical Therapy

## 2019-12-29 ENCOUNTER — Other Ambulatory Visit: Payer: Self-pay

## 2019-12-29 DIAGNOSIS — M545 Low back pain, unspecified: Secondary | ICD-10-CM

## 2019-12-29 DIAGNOSIS — M533 Sacrococcygeal disorders, not elsewhere classified: Secondary | ICD-10-CM | POA: Diagnosis not present

## 2019-12-29 DIAGNOSIS — R252 Cramp and spasm: Secondary | ICD-10-CM | POA: Diagnosis not present

## 2019-12-29 DIAGNOSIS — M6281 Muscle weakness (generalized): Secondary | ICD-10-CM

## 2019-12-29 NOTE — Patient Instructions (Signed)
Access Code: [email or web address]  URL: [email or web address]  Date: 12/29/2019  Prepared by: Eulis Foster   Exercises Bent Knee Fallouts - 10 reps - 1 sets - 1x daily - 7x weekly Supine Alternating Shoulder Flexion - 20 reps - 1 sets - 1x daily - 7x weekly Hooklying Pelvic Floor Contractions - 10 reps - 1 sets - 5 sec hold                            - 2x daily - 7x weekly Prone Hip Extension Sequence - 10 reps - 1 sets - 1x daily - 7x weekly Prone Hip Abduction on Slider - 10 reps - 1 sets - 1x daily - 7x weekly Bird Dog - 10 reps - 1 sets - 1x daily - 7x weekly [facility] [address], [address] [place], [place] [address] Phone # [phone] Fax [phone]

## 2019-12-29 NOTE — Therapy (Signed)
[facility] [address], [address] [place], [place], [address] Phone: [phone]   Fax:  [phone]  Physical Therapy Treatment  Patient Details  Name: Lauren Small MRN: [id number] Date of Birth: 06-02-1986 Referring Provider (PT): Dr. Belva Agee   Encounter Date: 12/29/2019  PT End of Session - 12/29/19 1620    Visit Number  11    Date for PT Re-Evaluation  01/03/20    Authorization Type  bcbs    PT Start Time  1615    PT Stop Time  1655    PT Time Calculation (min)  40 min    Activity Tolerance  Patient tolerated treatment well;No increased pain    Behavior During Therapy  WFL for tasks assessed/performed       Past Medical History:  Diagnosis Date  . Allergy   . Asthma   . Blood in stool   . GERD (gastroesophageal reflux disease)   . History of chicken pox     Past Surgical History:  Procedure Laterality Date  . urethra polyp    . WISDOM TOOTH EXTRACTION      There were no vitals filed for this visit.  Subjective Assessment - 12/29/19 1622    Subjective  My coccyx pain returned that night of therapy. My upper back pain returned today.    Patient Stated Goals  reduce the tailbone pain, sacral pain    Currently in Pain?  Yes    Pain Score  5     Pain Location  Coccyx    Pain Orientation  Mid    Pain Descriptors / Indicators  Shooting    Pain Type  Acute pain    Pain Onset  More than a month ago    Pain Frequency  Intermittent    Aggravating Factors   sitting    Pain Relieving Factors  flexed more at the hips    Multiple Pain Sites  Yes    Pain Score  6    Pain Location  Back    Pain Orientation  Upper    Pain Descriptors / Indicators  Radiating    Pain Type  Acute pain    Pain Onset  More than a month ago    Pain Frequency  Intermittent    Aggravating Factors   on feet too long    Pain Relieving Factors  standing                       [facility] Adult PT Treatment/Exercise -  12/29/19 0001      Lumbar Exercises: Aerobic   Elliptical  level 1 for 5 min while assessing the patient      Lumbar Exercises: Prone   Straight Leg Raise  10 reps    Straight Leg Raises Limitations  engagement of the gluteal and keeping the pelvis in correct alignment    Other Prone Lumbar Exercises  prone hip abduction with tactile cues to the lumbar spine  and tactles cues to the gluteal and lower abdominal engagement      Lumbar Exercises: Quadruped   Opposite Arm/Leg Raise  Right arm/Left leg;Left arm/Right leg;10 reps;1 second      Manual Therapy   Manual Therapy  Manual Lymphatic Drainage (MLD)    Joint Mobilization  T11-L3 gapping on the right and left to mobilize the joint    Soft tissue mobilization  bilateral lumbar paraspinals, gluteals and around the coccyx  Trigger Point Dry Needling - 12/29/19 0001    Consent Given?  Yes    Education Handout Provided  Previously provided    Muscles Treated Back/Hip  Lumbar multifidi    Electrical Stimulation Performed with Dry Needling  Yes    E-stim with Dry Needling Details  to patient tolerance    Lumbar multifidi Response  Twitch response elicited;Palpable increased muscle length           PT Education - 12/29/19 1701    Education Details  Access Code: [email or web address]    Person(s) Educated  Patient    Methods  Explanation;Demonstration;Handout    Comprehension  Verbalized understanding;Returned demonstration       PT Short Term Goals - 12/14/19 1709      PT SHORT TERM GOAL #1   Title  independent with initial HEP    Time  4    Period  Weeks    Status  Achieved      PT SHORT TERM GOAL #2   Title  pelvis stays in correct alignment for 4 weeks due to improved stability    Time  4    Period  Weeks    Status  On-going      PT SHORT TERM GOAL #3   Title  understand correct toileting techniques to improve constipation and ways to manage consitpation    Time  4    Period  Weeks    Status  On-going    Target Date   12/06/19      PT SHORT TERM GOAL #4   Title  pain with movement in bed decreased >/= 25%    Time  4    Period  Weeks    Status  On-going      PT SHORT TERM GOAL #5   Title  coccyx pain with sitting and bowel movements decreased >/= 25%    Time  4    Period  Weeks    Status  On-going        PT Long Term Goals - 11/08/19 1612      PT LONG TERM GOAL #1   Title  independent with advanced HEP    Time  8    Period  Weeks    Status  New    Target Date  01/03/20      PT LONG TERM GOAL #2   Title  coccyx pain decreased >/= 75% with sitting    Time  8    Period  Weeks    Status  New    Target Date  01/03/20      PT LONG TERM GOAL #3   Title  diastasis recti decreases >/= 2 fingers width with good fascial tension    Time  4    Period  Weeks    Status  New    Target Date  01/03/20      PT LONG TERM GOAL #4   Title  sitting and standing with pain decreased >/= 75% due to improve stability of SI joint and lumbar area    Time  8    Period  Weeks    Status  New    Target Date  01/03/20      PT LONG TERM GOAL #5   Title  pain with    Baseline  feeling of vagina area feeling heavy and swollen decreased >/= 75% with standing to shop or house work due to improved pelvic floor strength    Time  8    Period  Weeks    Status  New    Target Date  01/03/20            Plan - 12/29/19 1654    Clinical Impression Statement  Patient had no coccyx and back pain after therapy. Patient had tightness in the thoracic lumbar area that felt better after mobilization. Patient is learning how to stabilize her pelvis and core with hip movements to reduce the strain. Patient will be using her hoem tens unit to manage her lumbar pelvic pain. Patient is able to move in therapy with greater ease. Patient will benefit from skilled therapy to improve strength, stabiliation of the SI joint and reducing the coccyx pain.    Personal Factors and Comorbidities  Comorbidity 1;Comorbidity 2     Comorbidities  IBS, diastasis Recti    Examination-Activity Limitations  Caring for Others;Stand;Toileting;Locomotion Level;Transfers;Bend;Sit    Examination-Participation Restrictions  Community Activity;Interpersonal Relationship    Stability/Clinical Decision Making  Evolving/Moderate complexity    Rehab Potential  Excellent    PT Frequency  2x / week    PT Duration  8 weeks    PT Treatment/Interventions  ADLs/Self Care Home Management;Biofeedback;Cryotherapy;Electrical Stimulation;Iontophoresis 4mg /ml Dexamethasone;Moist Heat;Ultrasound;Balance training;Therapeutic exercise;Therapeutic activities;Neuromuscular re-education;Patient/family education;Dry needling;Manual techniques;Taping;Spinal Manipulations    PT Next Visit Plan  work on core stabilization exercises, check pelvic alignment, write renewal, joint mobilizaiton to T/L junction    PT Home Exercise Plan  Access Code: [email or web address]    Consulted and Agree with Plan of Care  Patient       Patient will benefit from skilled therapeutic intervention in order to improve the following deficits and impairments:  Decreased coordination, Decreased range of motion, Increased fascial restricitons, Decreased endurance, Increased muscle spasms, Pain, Impaired flexibility, Decreased strength, Decreased mobility  Visit Diagnosis: Coccyx pain  Acute bilateral low back pain without sciatica  Muscle weakness (generalized)  Cramp and spasm     Problem List Patient Active Problem List   Diagnosis Date Noted  . Pregnant 09/15/2019    09/17/2019, PT 12/29/19 5:10 PM   [facility] [address], [address] [place], [place], [place] Phone: [phone]   Fax:  [phone]  Name: Lauren Small MRN: [id number] [id number] Date of Birth: January 06, 1986

## 2020-01-03 ENCOUNTER — Encounter: Payer: BC Managed Care – PPO | Admitting: Physical Therapy

## 2020-01-04 ENCOUNTER — Ambulatory Visit: Payer: BC Managed Care – PPO | Admitting: Physical Therapy

## 2020-01-04 ENCOUNTER — Encounter: Payer: Self-pay | Admitting: Physical Therapy

## 2020-01-04 ENCOUNTER — Other Ambulatory Visit: Payer: Self-pay

## 2020-01-04 DIAGNOSIS — M545 Low back pain, unspecified: Secondary | ICD-10-CM

## 2020-01-04 DIAGNOSIS — M533 Sacrococcygeal disorders, not elsewhere classified: Secondary | ICD-10-CM | POA: Diagnosis not present

## 2020-01-04 DIAGNOSIS — R252 Cramp and spasm: Secondary | ICD-10-CM | POA: Diagnosis not present

## 2020-01-04 DIAGNOSIS — M6281 Muscle weakness (generalized): Secondary | ICD-10-CM

## 2020-01-04 NOTE — Therapy (Signed)
[facility] [address] [place], [place], [address] Phone: [phone]   Fax:  [phone]  Physical Therapy Treatment  Patient Details  Name: Lauren Small MRN: [id number] Date of Birth: 1986/07/28 Referring Provider (PT): Dr. Belva Agee   Encounter Date: 01/04/2020  PT End of Session - 01/04/20 1236    Visit Number  12    Date for PT Re-Evaluation  03/28/20    Authorization Type  bcbs    PT Start Time  1145    PT Stop Time  1225    PT Time Calculation (min)  40 min    Activity Tolerance  Patient tolerated treatment well;No increased pain    Behavior During Therapy  WFL for tasks assessed/performed       Past Medical History:  Diagnosis Date  . Allergy   . Asthma   . Blood in stool   . GERD (gastroesophageal reflux disease)   . History of chicken pox     Past Surgical History:  Procedure Laterality Date  . urethra polyp    . WISDOM TOOTH EXTRACTION      There were no vitals filed for this visit.  Subjective Assessment - 01/04/20 1151    Subjective  I felt good from last visit. I feel like I have the connection of my core compared to this weekend. My SI has not been hurting at all since last visit. My back is less achiness and tightness. I tried 2 minutes of jogging and did not have pain.    Patient Stated Goals  reduce the tailbone pain, sacral pain    Currently in Pain?  Yes    Pain Score  5     Pain Location  Coccyx    Pain Orientation  Mid    Pain Descriptors / Indicators  Shooting    Pain Type  Acute pain    Pain Onset  More than a month ago    Pain Frequency  Intermittent    Aggravating Factors   sitting especially on firm chair    Pain Relieving Factors  flexed more at the hips; recline back more in sitting    Multiple Pain Sites  No         [facility] PT Assessment - 01/04/20 0001      Assessment   Medical Diagnosis  M54.5 Low back pain; sacral pain    Referring Provider (PT)  Dr.  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    Leisure  work out 5-6 times per week      Cognition   Overall Cognitive Status  Within Functional Limits for tasks assessed      Posture/Postural Control   Posture/Postural Control  No significant limitations      ROM / Strength   AROM / PROM / Strength  AROM;PROM;Strength      AROM   Lumbar Extension  decreased by 25% with pain    Lumbar - Right Side Bend  decreased by 25% with tightness and pain on  the right thoracic lumbar area    Lumbar - Right Rotation  decreased by 25% with pulling of the anterior abdominal and back rib cage    Lumbar - Left Rotation  full      Strength   Right Hip Flexion  4/5    Right Hip Extension  5/5    Right Hip ABduction  3+/5    Right Hip ADduction  4+/5      Palpation   SI assessment   right ilium rotated anteriorly, pubic bone equal but tender,     Palpation comment  superior pubic bone area is not tender, tenderness located on the left rib cage, right lower abdomen                Pelvic Floor Special Questions - 01/04/20 0001    Diastasis Recti  2 finger above umbilicus, 0.5 below the umbilicus    Currently Sexually Active  Yes    Is this Painful  Yes   along the scar   Urinary Leakage  No    Strength  fair squeeze, definite lift   circular       [facility] Adult PT Treatment/Exercise - 01/04/20 0001      Lumbar Exercises: Aerobic   Elliptical  level 2 for 5 min while assessing the patient      Lumbar Exercises: Sidelying   Hip Abduction  Right;10 reps;1 second    Hip Abduction Limitations  engaging the abdominals and keeping the pelvis in alignment      Lumbar  Exercises: Prone   Single Arm Raise  Right;Left;10 reps;1 second    Single Arm Raise Weights (lbs)  abdominal contraction    Other Prone Lumbar Exercises  bil. shoulder extension hold 5 sec 10x with abdominal contraction      Manual Therapy   Manual Therapy  Soft tissue mobilization;Joint mobilization;Muscle Energy Technique;Myofascial release    Joint Mobilization  P-A and rotational mobilization to T3-L5 and bilateral rib cage mobilization in prone    Soft tissue mobilization  bil. iliopsoas and lower abdominal    Myofascial Release  bilateral lower quadrants and lifting the intestines off the bladder    Muscle Energy Technique  correct right ilium               PT Education - 01/04/20 1231    Education Details  Access Code: [email or web address]    Person(s) Educated  Patient    Methods  Explanation;Demonstration;Verbal cues;Handout    Comprehension  Returned demonstration;Verbalized understanding       PT Short Term Goals - 01/04/20 1238      PT SHORT TERM GOAL #2   Title  pelvis stays in correct alignment for 4 weeks due to improved stability    Time  4    Period  Weeks    Status  On-going      PT SHORT TERM GOAL #3   Title  understand correct toileting techniques to improve constipation and ways to manage consitpation    Time  4    Period  Weeks    Status  On-going    Target Date  12/06/19      PT SHORT TERM GOAL #4   Title  pain with movement in bed decreased >/= 25%    Time  4    Period  Weeks    Status  Achieved    Target Date  12/06/19      PT SHORT TERM GOAL #5   Title  coccyx pain with sitting and bowel movements decreased >/= 25%    Time  4    Period  Weeks    Status  On-going    Target Date  12/06/19        PT Long Term Goals - 01/04/20 1241      PT LONG TERM GOAL #1   Title  independent with advanced HEP    Time  8    Period  Weeks    Status  On-going      PT LONG TERM GOAL #2   Title  coccyx pain decreased >/= 75% with sitting    Time  8     Period  Weeks    Status  On-going      PT LONG TERM GOAL #3   Title  diastasis recti decreases >/= 2 fingers width with good fascial tension    Time  4    Period  Weeks    Status  Achieved      PT LONG TERM GOAL #4   Title  sitting and standing with pain decreased >/= 75% due to improve stability of SI joint and lumbar area    Time  8    Period  Weeks    Status  On-going      PT LONG TERM GOAL #5   Title  feeling of vagina area feeling heavy and swollen decreased >/= 75% with standing to shop or house work due to improved pelvic floor strength    Baseline  --    Time  8    Period  Weeks    Status  On-going            Plan - 01/04/20 1213    Clinical Impression Statement  Patient has not had SI pain since last visit. Patient has coccyx pain at level 5/10 with sitting. Pain is better if sitting forward or with hips flexed. Patient right hip strength is 4/5 with exception of abduction 3+/5. Patient has trigger points in the pelvic floor muscles and around the coccyx. Patient lumbar ROM is limited by 25% with right sidebending, left rotation, and extension. Patient has decreased mobility with thoracic and lumbar spine and bilateral posterior rib cage. Patient still has some pain with intercourse. Patient has a home TENS unit she is using for back pain. Paitent pelvis will go out of alignment every other week. Patient is able to move on the bed with greater ease. Patient is able to walk wiht increased fluid movement. Patient is able to take care of the child with greater ease. Patient will benefit from skilled therapy to improve strength, stabilization of the SI joint and reducing the coccyx pain.    Personal Factors and Comorbidities  Comorbidity 1;Comorbidity 2    Comorbidities  IBS, diastasis Recti    Examination-Activity Limitations  Caring for Others;Stand;Toileting;Locomotion Level;Transfers;Bend;Sit    Examination-Participation Restrictions  Community Activity;Interpersonal  Relationship    Stability/Clinical Decision Making  Evolving/Moderate complexity    Rehab Potential  Excellent    PT Frequency  2x / week    PT Duration  8 weeks    PT Treatment/Interventions  ADLs/Self Care Home Management;Biofeedback;Cryotherapy;Electrical Stimulation;Iontophoresis 4mg /ml Dexamethasone;Moist Heat;Ultrasound;Balance training;Therapeutic exercise;Therapeutic activities;Neuromuscular re-education;Patient/family education;Dry needling;Manual techniques;Taping;Spinal Manipulations    PT Next Visit Plan  work on core stabilization exercises, check pelvic alignment,perineal scare, pelvic floor contraction    PT Home Exercise Plan  Access Code: [email or web address]    Consulted and Agree with Plan of Care  Patient  Patient will benefit from skilled therapeutic intervention in order to improve the following deficits and impairments:  Decreased coordination, Decreased range of motion, Increased fascial restricitons, Decreased endurance, Increased muscle spasms, Pain, Impaired flexibility, Decreased strength, Decreased mobility  Visit Diagnosis: Coccyx pain - Plan: PT plan of care cert/re-cert  Acute bilateral low back pain without sciatica - Plan: PT plan of care cert/re-cert  Muscle weakness (generalized) - Plan: PT plan of care cert/re-cert  Cramp and spasm - Plan: PT plan of care cert/re-cert     Problem List Patient Active Problem List   Diagnosis Date Noted  . Pregnant 09/15/2019    Eulis Foster, PT 01/04/20 1:52 PM   [facility] [address] [place], [place], [address] Phone: [phone]   Fax:  [phone]  Name: Lauren Small MRN: [id number] Date of Birth: 09/17/1986

## 2020-01-04 NOTE — Patient Instructions (Signed)
Access Code: [email or web address]  URL: [email or web address]  Date: 01/04/2020  Prepared by: Eulis Foster   Exercises Bent Knee Fallouts - 10 reps - 1 sets - 1x daily - 7x weekly Supine Alternating Shoulder Flexion - 20 reps - 1 sets - 1x daily - 7x weekly Hooklying Pelvic Floor Contractions - 10 reps - 1 sets - 5 sec hold                            - 2x daily - 7x weekly Prone Hip Abduction on Slider - 10 reps - 1 sets - 1x daily - 7x weekly Bird Dog - 10 reps - 1 sets - 1x daily - 7x weekly Prone Scapular Slide with Shoulder Extension - 10 reps - 1 sets - 5 sec hold - 1x daily - 7x weekly Prone Single Shoulder Flexion - 10 reps - 1 sets - 1x daily - 7x weekly Sidelying Hip Abduction on Wall - 10 reps - 1 sets - 1x daily - 7x weekly [facility] [address] [place], [place] [address] Phone # [phone] Fax [phone]

## 2020-01-12 ENCOUNTER — Encounter: Payer: Self-pay | Admitting: Physical Therapy

## 2020-01-12 ENCOUNTER — Other Ambulatory Visit: Payer: Self-pay

## 2020-01-12 ENCOUNTER — Ambulatory Visit: Payer: BC Managed Care – PPO | Admitting: Physical Therapy

## 2020-01-12 DIAGNOSIS — M545 Low back pain, unspecified: Secondary | ICD-10-CM

## 2020-01-12 DIAGNOSIS — M533 Sacrococcygeal disorders, not elsewhere classified: Secondary | ICD-10-CM | POA: Diagnosis not present

## 2020-01-12 DIAGNOSIS — R252 Cramp and spasm: Secondary | ICD-10-CM | POA: Diagnosis not present

## 2020-01-12 DIAGNOSIS — M6281 Muscle weakness (generalized): Secondary | ICD-10-CM

## 2020-01-12 NOTE — Therapy (Signed)
[facility] [address] [place], [place], [address] Phone: [phone]   Fax:  [phone]  Physical Therapy Treatment  Patient Details  Name: Lauren Small MRN: [id number] Date of Birth: [birth year] Referring Provider (PT): Dr. Belva Agee   Encounter Date: 01/12/2020  PT End of Session - 01/12/20 1614    Visit Number  13    Date for PT Re-Evaluation  03/28/20    Authorization Type  bcbs    PT Start Time  1539    PT Stop Time  1613    PT Time Calculation (min)  34 min    Activity Tolerance  Patient tolerated treatment well;No increased pain    Behavior During Therapy  WFL for tasks assessed/performed       Past Medical History:  Diagnosis Date  . Allergy   . Asthma   . Blood in stool   . GERD (gastroesophageal reflux disease)   . History of chicken pox     Past Surgical History:  Procedure Laterality Date  . urethra polyp    . WISDOM TOOTH EXTRACTION      There were no vitals filed for this visit.  Subjective Assessment - 01/12/20 1539    Subjective  The only thing that flared up is my back. Tailbone is doing better. No urinary leakage.    Patient Stated Goals  reduce the tailbone pain, sacral pain    Currently in Pain?  Yes    Pain Score  6     Pain Location  Coccyx    Pain Orientation  Mid    Pain Descriptors / Indicators  Shooting    Pain Type  Acute pain    Pain Onset  More than a month ago    Pain Frequency  Intermittent    Aggravating Factors   sitting on a hard surface, bear down for a bowel movement hurts the coccyx    Pain Relieving Factors  sit on softer chair    Multiple Pain Sites  Yes    Pain Score  9    Pain Location  Back    Pain Orientation  Upper    Pain Descriptors / Indicators  Radiating    Pain Type  Acute pain    Pain Onset  More than a month ago    Pain Frequency  Constant    Aggravating Factors   on feet too long    Pain Relieving Factors  tens machine, massage  gun                       [facility] Adult PT Treatment/Exercise - 01/12/20 0001      Manual Therapy   Manual Therapy  Soft tissue mobilization;Joint mobilization    Joint Mobilization  P-A and rotational mobilization to T3-L5 and bilateral rib cage mobilization in prone    Soft tissue mobilization  bil. quadratus, lumbar paraspinals    Kinesiotex  Inhibit Muscle      Kinesiotix   Inhibit Muscle   the lumbar paraspinals       Trigger Point Dry Needling - 01/12/20 0001    Consent Given?  Yes    Education Handout Provided  Previously provided    Muscles Treated Back/Hip  Lumbar multifidi;Quadratus lumborum    Electrical Stimulation Performed with Dry Needling  Yes    E-stim with Dry Needling Details  to patient tolerance in low thoracic    Other Dry Needling  T9-T12 multifidi  Lumbar multifidi Response  Twitch response elicited;Palpable increased muscle length    Quadratus Lumborum Response  Twitch response elicited;Palpable increased muscle length             PT Short Term Goals - 01/04/20 1238      PT SHORT TERM GOAL #2   Title  pelvis stays in correct alignment for 4 weeks due to improved stability    Time  4    Period  Weeks    Status  On-going      PT SHORT TERM GOAL #3   Title  understand correct toileting techniques to improve constipation and ways to manage consitpation    Time  4    Period  Weeks    Status  On-going    Target Date  12/06/19      PT SHORT TERM GOAL #4   Title  pain with movement in bed decreased >/= 25%    Time  4    Period  Weeks    Status  Achieved    Target Date  12/06/19      PT SHORT TERM GOAL #5   Title  coccyx pain with sitting and bowel movements decreased >/= 25%    Time  4    Period  Weeks    Status  On-going    Target Date  12/06/19        PT Long Term Goals - 01/12/20 1557      PT LONG TERM GOAL #1   Title  independent with advanced HEP    Time  8    Period  Weeks    Status  On-going      PT LONG  TERM GOAL #2   Title  coccyx pain decreased >/= 75% with sitting    Baseline  20% but depends on the surface    Time  8    Period  Weeks    Status  On-going      PT LONG TERM GOAL #3   Title  diastasis recti decreases >/= 2 fingers width with good fascial tension    Time  4    Period  Weeks    Status  Achieved      PT LONG TERM GOAL #4   Title  sitting and standing with pain decreased >/= 75% due to improve stability of SI joint and lumbar area    Baseline  standing for the coccyx and SI  decreased by 90% better; increased pain with back    Time  8    Period  Weeks    Status  On-going      PT LONG TERM GOAL #5   Title  feeling of vagina area feeling heavy and swollen decreased >/= 75% with standing to shop or house work due to improved pelvic floor strength    Baseline  75% better    Time  8    Period  Weeks    Status  On-going            Plan - 01/12/20 1559    Clinical Impression Statement  Patient has difficulty with segmental mobility of the lumbar going form flexion to neutral. Patient able to stand with coccyx and SI joint pain decreased by 75% but lumbar thoracic area pain will increase. Patient is able to sit on soft surfaces now but has increased pain with hard surface. Patient has coccyx pain with bowel movement. The scar area is sore with intercourse. Patient will benefit from  skilled therapy to improve strength, stabilization of the SI joint and reducing the coccyx pain.    Personal Factors and Comorbidities  Comorbidity 1;Comorbidity 2    Comorbidities  IBS, diastasis Recti    Examination-Activity Limitations  Caring for Others;Stand;Toileting;Locomotion Level;Transfers;Bend;Sit    Examination-Participation Restrictions  Community Activity;Interpersonal Relationship    Stability/Clinical Decision Making  Evolving/Moderate complexity    Rehab Potential  Excellent    PT Frequency  2x / week    PT Duration  8 weeks    PT Treatment/Interventions  ADLs/Self Care  Home Management;Biofeedback;Cryotherapy;Electrical Stimulation;Iontophoresis 4mg /ml Dexamethasone;Moist Heat;Ultrasound;Balance training;Therapeutic exercise;Therapeutic activities;Neuromuscular re-education;Patient/family education;Dry needling;Manual techniques;Taping;Spinal Manipulations    PT Next Visit Plan  work on core stabilization exercises, check pelvic alignment,perineal scar, pelvic floor contraction; back exercises against gravity; check diastasis    PT Home Exercise Plan  Access Code: [email or web address]    Consulted and Agree with Plan of Care  Patient       Patient will benefit from skilled therapeutic intervention in order to improve the following deficits and impairments:  Decreased coordination, Decreased range of motion, Increased fascial restricitons, Decreased endurance, Increased muscle spasms, Pain, Impaired flexibility, Decreased strength, Decreased mobility  Visit Diagnosis: Coccyx pain  Acute bilateral low back pain without sciatica  Muscle weakness (generalized)  Cramp and spasm     Problem List Patient Active Problem List   Diagnosis Date Noted  . Pregnant 09/15/2019    09/17/2019, PT 01/12/20 4:18 PM    [facility] [address] [place], [place], [place] Phone: [phone]   Fax:  [phone]  Name: Tisa Weisel MRN: Lysle Morales Date of Birth: [birth year]

## 2020-01-20 ENCOUNTER — Encounter: Payer: Self-pay | Admitting: Physical Therapy

## 2020-01-20 ENCOUNTER — Ambulatory Visit: Payer: BC Managed Care – PPO | Admitting: Physical Therapy

## 2020-01-20 ENCOUNTER — Other Ambulatory Visit: Payer: Self-pay

## 2020-01-20 DIAGNOSIS — M533 Sacrococcygeal disorders, not elsewhere classified: Secondary | ICD-10-CM

## 2020-01-20 DIAGNOSIS — M545 Low back pain, unspecified: Secondary | ICD-10-CM

## 2020-01-20 DIAGNOSIS — R252 Cramp and spasm: Secondary | ICD-10-CM | POA: Diagnosis not present

## 2020-01-20 DIAGNOSIS — M6281 Muscle weakness (generalized): Secondary | ICD-10-CM | POA: Diagnosis not present

## 2020-01-20 NOTE — Therapy (Signed)
[facility] [address], [address] [place], [place], [address] Phone: [phone]   Fax:  [phone]  Physical Therapy Treatment  Patient Details  Name: Lauren Small MRN: [id number] Date of Birth: 02/17/1986 Referring Provider (PT): Dr. Belva Agee   Encounter Date: 01/20/2020  PT End of Session - 01/20/20 1659    Visit Number  14    Date for PT Re-Evaluation  03/28/20    Authorization Type  bcbs    PT Start Time  1615    PT Stop Time  1655    PT Time Calculation (min)  40 min    Activity Tolerance  Patient tolerated treatment well;No increased pain    Behavior During Therapy  WFL for tasks assessed/performed       Past Medical History:  Diagnosis Date  . Allergy   . Asthma   . Blood in stool   . GERD (gastroesophageal reflux disease)   . History of chicken pox     Past Surgical History:  Procedure Laterality Date  . urethra polyp    . WISDOM TOOTH EXTRACTION      There were no vitals filed for this visit.  Subjective Assessment - 01/20/20 1617    Subjective  When I lay flat my SI is bothering me. Tailbone is better. I do not fully empty unless I lean forward.    Patient Stated Goals  reduce the tailbone pain, sacral pain    Currently in Pain?  Yes    Pain Score  5     Pain Location  Coccyx    Pain Orientation  Mid    Pain Descriptors / Indicators  Shooting    Pain Type  Acute pain    Pain Onset  More than a month ago    Pain Frequency  Intermittent    Aggravating Factors   sitting on a hard surface, bear down for a bowel movement hurst the coccyx    Pain Relieving Factors  sit on softer chair    Multiple Pain Sites  Yes    Pain Score  8    Pain Location  Back    Pain Orientation  Upper    Pain Descriptors / Indicators  Radiating    Pain Type  Acute pain    Pain Onset  More than a month ago    Pain Frequency  Constant    Aggravating Factors   on feet too long    Pain Relieving Factors  TENS  machine, massage gun                    Pelvic Floor Special Questions - 01/20/20 0001    Urinary Leakage  No    Pelvic Floor Internal Exam  Patient confirms identification and approves PT to assess pelvic floor and treatment    Exam Type  Vaginal    Palpation  tenderness located at perineal body, bil. ischial spine    Strength  fair squeeze, definite lift        [facility] Adult PT Treatment/Exercise - 01/20/20 0001      Therapeutic Activites    Therapeutic Activities  Other Therapeutic Activities    Other Therapeutic Activities  sleeping positions with towel rolls under spine to reduce lumbar lordosis      Lumbar Exercises: Stretches   Quadruped Mid Back Stretch  1 rep;30 seconds    Piriformis Stretch  Right;Left;1 rep;30 seconds    Piriformis Stretch Limitations  pigeon pose  Lumbar Exercises: Seated   Other Seated Lumbar Exercises  sitting with belt blocking pelvis and bend forward 5 times then to the side 5 times each side, sitting moving pelvis side to side and trunk rotation and pelvic tilt with pressure on the anterior coccyx area      Lumbar Exercises: Quadruped   Madcat/Old Horse  10 reps    Madcat/Old Horse Limitations  tail wag      Manual Therapy   Manual Therapy  Internal Pelvic Floor    Internal Pelvic Floor  perineal scar mobilization, bil. ischial spine, coccygeus, puborectalis             PT Education - 01/20/20 1658    Education Details  sleeping positions on back and side with towel rolls to take up space    Person(s) Educated  Patient    Methods  Explanation;Demonstration;Verbal cues;Handout    Comprehension  Verbalized understanding;Returned demonstration       PT Short Term Goals - 01/20/20 1626      PT SHORT TERM GOAL #5   Title  coccyx pain with sitting and bowel movements decreased >/= 25%    Time  4    Period  Weeks    Status  Achieved        PT Long Term Goals - 01/12/20 1557      PT LONG TERM GOAL #1   Title   independent with advanced HEP    Time  8    Period  Weeks    Status  On-going      PT LONG TERM GOAL #2   Title  coccyx pain decreased >/= 75% with sitting    Baseline  20% but depends on the surface    Time  8    Period  Weeks    Status  On-going      PT LONG TERM GOAL #3   Title  diastasis recti decreases >/= 2 fingers width with good fascial tension    Time  4    Period  Weeks    Status  Achieved      PT LONG TERM GOAL #4   Title  sitting and standing with pain decreased >/= 75% due to improve stability of SI joint and lumbar area    Baseline  standing for the coccyx and SI  decreased by 90% better; increased pain with back    Time  8    Period  Weeks    Status  On-going      PT LONG TERM GOAL #5   Title  feeling of vagina area feeling heavy and swollen decreased >/= 75% with standing to shop or house work due to improved pelvic floor strength    Baseline  75% better    Time  8    Period  Weeks    Status  On-going            Plan - 01/20/20 1625    Clinical Impression Statement  Patient coccyx pain is more with sitting. Patient has 25% less coccyx pain with bowel movement. Patient still has lumbar pain due to tightness. Patient pelvis in correct alignment. Patient is not able to lift 2 legs up when on her back. Patient pelvic floor strength is 3/5. Patient still has some tenderness located in the perineal scar. Increased tenderness located on the ischial tuberosity. Patient will benefit from skilled therapy to improve strength, stabilization of the SI joint and reducing the coccyx pain.  Personal Factors and Comorbidities  Comorbidity 1;Comorbidity 2    Comorbidities  IBS, diastasis Recti    Examination-Activity Limitations  Caring for Others;Stand;Toileting;Locomotion Level;Transfers;Bend;Sit    Examination-Participation Restrictions  Community Activity;Interpersonal Relationship    Stability/Clinical Decision Making  Evolving/Moderate complexity    Rehab Potential   Excellent    PT Frequency  2x / week    PT Duration  8 weeks    PT Treatment/Interventions  ADLs/Self Care Home Management;Biofeedback;Cryotherapy;Electrical Stimulation;Iontophoresis 4mg /ml Dexamethasone;Moist Heat;Ultrasound;Balance training;Therapeutic exercise;Therapeutic activities;Neuromuscular re-education;Patient/family education;Dry needling;Manual techniques;Taping;Spinal Manipulations    PT Next Visit Plan  work on core stabilization exercises, ,perineal scar, pelvic floor contraction; back exercises against gravity; check diastasis, stretch lumbar spine    PT Home Exercise Plan  Access Code: [email or web address]    Consulted and Agree with Plan of Care  Patient       Patient will benefit from skilled therapeutic intervention in order to improve the following deficits and impairments:  Decreased coordination, Decreased range of motion, Increased fascial restricitons, Decreased endurance, Increased muscle spasms, Pain, Impaired flexibility, Decreased strength, Decreased mobility  Visit Diagnosis: Coccyx pain  Acute bilateral low back pain without sciatica  Muscle weakness (generalized)  Cramp and spasm     Problem List Patient Active Problem List   Diagnosis Date Noted  . Pregnant 09/15/2019    09/17/2019, PT 01/20/20 5:03 PM   [facility] [address] [place], [place], [place] Phone: [phone]   Fax:  [phone]  Name: Jearline Hirschhorn MRN: Lysle Morales Date of Birth: [birth year]

## 2020-01-23 DIAGNOSIS — Z20828 Contact with and (suspected) exposure to other viral communicable diseases: Secondary | ICD-10-CM | POA: Diagnosis not present

## 2020-01-31 ENCOUNTER — Other Ambulatory Visit: Payer: Self-pay

## 2020-01-31 ENCOUNTER — Encounter: Payer: Self-pay | Admitting: Physical Therapy

## 2020-01-31 ENCOUNTER — Ambulatory Visit: Payer: BC Managed Care – PPO | Attending: Obstetrics and Gynecology | Admitting: Physical Therapy

## 2020-01-31 DIAGNOSIS — R252 Cramp and spasm: Secondary | ICD-10-CM | POA: Insufficient documentation

## 2020-01-31 DIAGNOSIS — M6281 Muscle weakness (generalized): Secondary | ICD-10-CM | POA: Diagnosis not present

## 2020-01-31 DIAGNOSIS — M545 Low back pain, unspecified: Secondary | ICD-10-CM

## 2020-01-31 DIAGNOSIS — M533 Sacrococcygeal disorders, not elsewhere classified: Secondary | ICD-10-CM | POA: Diagnosis not present

## 2020-01-31 NOTE — Therapy (Addendum)
[facility] [address], [place], [place], [address] Phone: [phone]   Fax:  [phone]  Physical Therapy Treatment  Patient Details  Name: Russell Engelstad MRN: [id number] Date of Birth: 07-29-1986 Referring Provider (PT): Dr. Lucillie Garfinkel   Encounter Date: 01/31/2020  PT End of Session - 01/31/20 1658    Visit Number  15    Date for PT Re-Evaluation  03/28/20    Authorization Type  bcbs    PT Start Time  1615    PT Stop Time  1655    PT Time Calculation (min)  40 min    Activity Tolerance  Patient tolerated treatment well;No increased pain    Behavior During Therapy  WFL for tasks assessed/performed       Past Medical History:  Diagnosis Date  . Allergy   . Asthma   . Blood in stool   . GERD (gastroesophageal reflux disease)   . History of chicken pox     Past Surgical History:  Procedure Laterality Date  . urethra polyp    . WISDOM TOOTH EXTRACTION      There were no vitals filed for this visit.  Subjective Assessment - 01/31/20 1618    Subjective  My back is feeling better, just tight. My right sitting bone has been bothering me.    Patient Stated Goals  reduce the tailbone pain, sacral pain    Currently in Pain?  Yes    Pain Score  3     Pain Location  Coccyx    Pain Orientation  Mid    Pain Descriptors / Indicators  Shooting    Pain Type  Acute pain    Pain Onset  More than a month ago    Pain Frequency  Intermittent    Aggravating Factors   sitting on a hard surface, bear down for a bowel movement huts the coccyx    Pain Relieving Factors  sit on soft chair    Multiple Pain Sites  Yes    Pain Score  5    Pain Location  Back    Pain Orientation  Upper    Pain Descriptors / Indicators  Radiating    Pain Type  Acute pain    Pain Onset  More than a month ago    Pain Frequency  Constant    Aggravating Factors   on feet too long    Pain Relieving Factors  TENs machine, massage gun          [facility] PT Assessment - 01/31/20 0001      Palpation   SI assessment   right ilium rotated anteriorly                Pelvic Floor Special Questions - 01/31/20 0001    Diastasis Recti  1 finger width above and below umbilicus        [facility] Adult PT Treatment/Exercise - 01/31/20 0001      Therapeutic Activites    Therapeutic Activities  Other Therapeutic Activities    Other Therapeutic Activities  education on correct toileting techniques to improve pelvic floor rleaxation and correct breathing      Lumbar Exercises: Aerobic   Elliptical  level 2 for 5 min while assessing the patient      Lumbar Exercises: Supine   Dead Bug  20 reps;1 second    Single Leg Bridge  10 reps;1 second   each leg    Bridge with [employer]  Limitations  with abdominal contraction      Lumbar Exercises: Prone   Other Prone Lumbar Exercises  hip abduction on elbows      Lumbar Exercises: Quadruped   Opposite Arm/Leg Raise  Right arm/Left leg;Left arm/Right leg;10 reps;1 second      Manual Therapy   Manual Therapy  Soft tissue mobilization;Muscle Energy Technique    Soft tissue mobilization  right quadratus, along the right sichialtuberosity right obturator internist and levator ani    Muscle Energy Technique  correct right ilium               PT Education - 01/31/20 1656    Education Details  Access Code: [email or web address]; toileting technique    Person(s) Educated  Patient    Methods  Explanation;Demonstration;Verbal cues;Handout    Comprehension  Verbalized understanding;Returned demonstration       PT Short Term Goals - 01/31/20 1621      PT SHORT TERM GOAL #1   Title  independent with initial HEP    Time  4    Period  Weeks    Status  Achieved      PT SHORT TERM GOAL #2   Title  pelvis stays in correct alignment for 4 weeks due to improved stability    Time  4    Period  Weeks    Status  Achieved    Target Date  12/06/19      PT SHORT TERM GOAL #3   Title   understand correct toileting techniques to improve constipation and ways to manage consitpation    Time  4    Period  Weeks    Status  Achieved      PT SHORT TERM GOAL #4   Title  pain with movement in bed decreased >/= 25%    Time  4    Period  Weeks    Status  Achieved      PT SHORT TERM GOAL #5   Title  coccyx pain with sitting and bowel movements decreased >/= 25%    Time  4    Period  Weeks    Status  Achieved        PT Long Term Goals - 01/12/20 1557      PT LONG TERM GOAL #1   Title  independent with advanced HEP    Time  8    Period  Weeks    Status  On-going      PT LONG TERM GOAL #2   Title  coccyx pain decreased >/= 75% with sitting    Baseline  20% but depends on the surface    Time  8    Period  Weeks    Status  On-going      PT LONG TERM GOAL #3   Title  diastasis recti decreases >/= 2 fingers width with good fascial tension    Time  4    Period  Weeks    Status  Achieved      PT LONG TERM GOAL #4   Title  sitting and standing with pain decreased >/= 75% due to improve stability of SI joint and lumbar area    Baseline  standing for the coccyx and SI  decreased by 90% better; increased pain with back    Time  8    Period  Weeks    Status  On-going      PT LONG TERM GOAL #5   Title  feeling of vagina  area feeling heavy and swollen decreased >/= 75% with standing to shop or house work due to improved pelvic floor strength    Baseline  75% better    Time  8    Period  Weeks    Status  On-going            Plan - 01/31/20 1643    Clinical Impression Statement  Patient has learned correct toileting technique. Patient pelvis was in correct alignment after manual work. Patient is able to use her lower abdominals to stabilize her pelvis with her exercise. Patient had tender points in the  right obturator internist and levator ani. Patient still has some difficulty with supine and birng right leg down due to instability on the right SI joint.  Diastasis recti decreased to 1 finger width. Patient will benefit from skilled therapy to improve strength, stabilization of the SI joint and reducing the coccyx pain.    Personal Factors and Comorbidities  Comorbidity 1;Comorbidity 2    Comorbidities  IBS, diastasis Recti    Examination-Activity Limitations  Caring for Others;Stand;Toileting;Locomotion Level;Transfers;Bend;Sit    Examination-Participation Restrictions  Community Activity;Interpersonal Relationship    Rehab Potential  Excellent    PT Frequency  2x / week    PT Duration  8 weeks    PT Treatment/Interventions  ADLs/Self Care Home Management;Biofeedback;Cryotherapy;Electrical Stimulation;Iontophoresis '4mg'$ /ml Dexamethasone;Moist Heat;Ultrasound;Balance training;Therapeutic exercise;Therapeutic activities;Neuromuscular re-education;Patient/family education;Dry needling;Manual techniques;Taping;Spinal Manipulations    PT Next Visit Plan  check pelvic alignment; work on thoracic lumbar junction to keep mobility, 1/2 kneel strengthening, dead check on emptying bowel and coccyx movement    PT Home Exercise Plan  Access Code: [email or web address]    Consulted and Agree with Plan of Care  Patient       Patient will benefit from skilled therapeutic intervention in order to improve the following deficits and impairments:  Decreased coordination, Decreased range of motion, Increased fascial restricitons, Decreased endurance, Increased muscle spasms, Pain, Impaired flexibility, Decreased strength, Decreased mobility  Visit Diagnosis: Coccyx pain  Acute bilateral low back pain without sciatica  Muscle weakness (generalized)  Cramp and spasm     Problem List Patient Active Problem List   Diagnosis Date Noted  . Pregnant 09/15/2019    Earlie Counts, PT 01/31/20 5:04 PM   [facility] [address] [place], [place], [address] Phone: [phone]   Fax:  [phone]  Name:  Ciearra Rufo MRN: [id number] Date of Birth: 09-24-1986  PHYSICAL THERAPY DISCHARGE SUMMARY  Visits from Start of Care: 15  Current functional level related to goals / functional outcomes: See above. Patient has not returned to physical therapy after her last visit on 01/31/2020. Unable to assess patient due to her not returning.    Remaining deficits: See above.    Education / Equipment: HEP Plan:                                                    Patient goals were partially met. Patient is being discharged due to not returning since the last visit. Thank you for the referral. Earlie Counts, PT 03/20/20 11:34 AM   ?????

## 2020-01-31 NOTE — Patient Instructions (Addendum)
Toileting Techniques for Bowel Movements (Defecation) Using your belly (abdomen) and pelvic floor muscles to have a bowel movement is usually instinctive.  Sometimes people can have problems with these muscles and have to relearn proper defecation (emptying) techniques.  If you have weakness in your muscles, organs that are falling out, decreased sensation in your pelvis, or ignore your urge to go, you may find yourself straining to have a bowel movement.  You are straining if you are: . holding your breath or taking in a huge gulp of air and holding it  . keeping your lips and jaw tensed and closed tightly . turning red in the face because of excessive pushing or forcing . developing or worsening your  hemorrhoids . getting faint while pushing . not emptying completely and have to defecate many times a day  If you are straining, you are actually making it harder for yourself to have a bowel movement.  Many people find they are pulling up with the pelvic floor muscles and closing off instead of opening the anus. Due to lack pelvic floor relaxation and coordination the abdominal muscles, one has to work harder to push the feces out.  Many people have never been taught how to defecate efficiently and effectively.  Notice what happens to your body when you are having a bowel movement.  While you are sitting on the toilet pay attention to the following areas: . Jaw and mouth position . Angle of your hips   . Whether your feet touch the ground or not . Arm placement  . Spine position . Waist . Belly tension . Anus (opening of the anal canal)  An Evacuation/Defecation Plan   Here are the 4 basic points:  1. Lean forward enough for your elbows to rest on your knees 2. Support your feet on the floor or use a low stool if your feet don't touch the floor  3. Push out your belly as if you have swallowed a beach ball--you should feel a widening of your waist 4. Open and relax your pelvic floor  muscles, rather than tightening around the anus       The following conditions my require modifications to your toileting posture:  . If you have had surgery in the past that limits your back, hip, pelvic, knee or ankle flexibility . Constipation   Your healthcare practitioner may make the following additional suggestions and adjustments:  1) Sit on the toilet  a) Make sure your feet are supported. b) Notice your hip angle and spine position--most people find it effective to lean forward or raise their knees, which can help the muscles around the anus to relax  c) When you lean forward, place your forearms on your thighs for support  2) Relax suggestions a) Breath deeply in through your nose and out slowly through your mouth as if you are smelling the flowers and blowing out the candles. b) To become aware of how to relax your muscles, contracting and releasing muscles can be helpful.  Pull your pelvic floor muscles in tightly by using the image of holding back gas, or closing around the anus (visualize making a circle smaller) and lifting the anus up and in.  Then release the muscles and your anus should drop down and feel open. Repeat 5 times ending with the feeling of relaxation. c) Keep your pelvic floor muscles relaxed; let your belly bulge out. d) The digestive tract starts at the mouth and ends at the anal opening, so  be sure to relax both ends of the tube.  Place your tongue on the roof of your mouth with your teeth separated.  This helps relax your mouth and will help to relax the anus at the same time.  3) Empty (defecation) a) Keep your pelvic floor and sphincter relaxed, then bulge your anal muscles.  Make the anal opening wide.  b) Stick your belly out as if you have swallowed a beach ball. c) Make your belly wall hard using your belly muscles while continuing to breathe. Doing this makes it easier to open your anus. d) Breath out and give a grunt (or try using other sounds  such as ahhhh, shhhhh, ohhhh or grrrrrrr).  4) Finish a) As you finish your bowel movement, pull the pelvic floor muscles up and in.  This will leave your anus in the proper place rather than remaining pushed out and down. If you leave your anus pushed out and down, it will start to feel as though that is normal and give you incorrect signals about needing to have a bowel movement. Access Code: [email or web address]  URL: [email or web address]  Date: 01/31/2020  Prepared by: Eulis Foster   Exercises Prone Hip Abduction on Slider - 10 reps - 1 sets - 1x daily - 7x weekly Bird Dog - 10 reps - 1 sets - 1x daily - 7x weekly Half Kneeling Diagonal Lift with Medicine Ball - 10 reps - 1 sets - 1x daily - 7x weekly Prone Scapular Slide with Shoulder Extension - 10 reps - 1 sets - 5 sec hold - 1x daily - 7x weekly Dead Bug - 10 reps - 1 sets - 1x daily - 7x weekly  [facility] [address], [address] [place], [place] [address] Phone # [phone] Fax [phone]

## 2020-02-02 ENCOUNTER — Ambulatory Visit (INDEPENDENT_AMBULATORY_CARE_PROVIDER_SITE_OTHER)
Admission: RE | Admit: 2020-02-02 | Discharge: 2020-02-02 | Disposition: A | Discharge status: Home / Self Care | Payer: BC Managed Care – PPO | Source: Ambulatory Visit

## 2020-02-02 DIAGNOSIS — J4530 Mild persistent asthma, uncomplicated: Secondary | ICD-10-CM

## 2020-02-02 DIAGNOSIS — J3089 Other allergic rhinitis: Secondary | ICD-10-CM

## 2020-02-02 DIAGNOSIS — J0191 Acute recurrent sinusitis, unspecified: Secondary | ICD-10-CM | POA: Diagnosis not present

## 2020-02-02 MED ORDER — AMOXICILLIN-POT CLAVULANATE 875-125 MG PO TABS: 1.0000 | ORAL_TABLET | Freq: Two times a day (BID) | ORAL | 0 refills | Status: DC

## 2020-02-02 MED ORDER — FLUCONAZOLE 150 MG PO TABS: 150.0000 mg | ORAL_TABLET | ORAL | 0 refills | Status: DC

## 2020-02-02 MED ORDER — PREDNISONE 20 MG PO TABS: ORAL_TABLET | ORAL | 0 refills | Status: DC

## 2020-02-02 NOTE — ED Provider Notes (Signed)
Virtual Visit via Video Note:  Lauren Small  initiated request for Telemedicine visit with [facility] team. I connected with [name]  on 02/02/2020 at 2:29 PM  for a synchronized telemedicine visit using a video enabled HIPPA compliant telemedicine application. I verified that I am speaking with [name]  using two identifiers. [name], PA-C  was physically located in a [facility] Urgent care site and [name] was located at a different location.   The limitations of evaluation and management by telemedicine as well as the availability of in-person appointments were discussed. Patient was informed that she  may incur a bill ( including co-pay) for this virtual visit encounter. [name]  expressed understanding and gave verbal consent to proceed with virtual visit.     History of Present Illness:Lauren Small  is a 34 y.o. female presents with 2-week history of persistent and worsening sinus pressure, sinus headaches, sinus congestion and chest congestion.  Patient has a history of allergic rhinitis and allergy/exercise-induced asthma.  She takes Zyrtec and Flonase every day.  Has been using her albuterol inhaler more frequently with good relief.  She did get tested about a week ago for COVID-19 and was negative.  ROS  Current Outpatient Medications  Medication Sig Dispense Refill  . cetirizine (ZYRTEC ALLERGY) 10 MG tablet Take 10 mg by mouth daily.     . fluticasone (FLONASE) 50 MCG/ACT nasal spray Place 2 sprays into both nostrils daily.     Marland Kitchen PRENATAL VIT-FE FUMARATE-FA PO Take 1 capsule by mouth daily.      No current facility-administered medications for this encounter.     Allergies  Allergen Reactions  . Erythromycin Base Rash          Past Medical History:  Diagnosis Date  . Allergy   . Asthma   . Blood in stool   . GERD (gastroesophageal reflux disease)   . History of chicken pox     Past Surgical  History:  Procedure Laterality Date  . urethra polyp    . WISDOM TOOTH EXTRACTION        Observations/Objective: Physical Exam Constitutional:      General: She is not in acute distress.    Appearance: Normal appearance. She is well-developed. She is not ill-appearing, toxic-appearing or diaphoretic.  Eyes:     Extraocular Movements: Extraocular movements intact.  Pulmonary:     Effort: Pulmonary effort is normal.  Neurological:     General: No focal deficit present.     Mental Status: She is alert and oriented to person, place, and time.  Psychiatric:        Mood and Affect: Mood normal.        Behavior: Behavior normal.        Thought Content: Thought content normal.        Judgment: Judgment normal.      Assessment and Plan:  1. Acute recurrent sinusitis, unspecified location   2. Allergic rhinitis due to other allergic trigger, unspecified seasonality   3. Mild persistent extrinsic asthma without complication    Start Augmentin to cover for sinusitis secondary to persistent allergic rhinitis.  Patient is to also start prednisone to help with her allergic rhinitis and asthma.  Will have her hold off on Flonase until she is recovered from her current infection.  Maintain Zyrtec and albuterol inhaler.  Patient does have history of yeast infections associated with antibiotic use.  Will cover for this with Diflucan. Counseled patient  on potential for adverse effects with medications prescribed/recommended today, ER and return-to-clinic precautions discussed, patient verbalized understanding.    Follow Up Instructions:    I discussed the assessment and treatment plan with the patient. The patient was provided an opportunity to ask questions and all were answered. The patient agreed with the plan and demonstrated an understanding of the instructions.   The patient was advised to call back or seek an in-person evaluation if the symptoms worsen or if the condition fails to  improve as anticipated.  I provided 10 minutes of non-face-to-face time during this encounter.    [name], PA-C  02/02/2020 2:29 PM         [name], PA-C 02/02/20 1439

## 2020-02-10 DIAGNOSIS — J0100 Acute maxillary sinusitis, unspecified: Secondary | ICD-10-CM | POA: Diagnosis not present

## 2020-02-14 ENCOUNTER — Encounter: Payer: BC Managed Care – PPO | Admitting: Physical Therapy

## 2020-02-24 ENCOUNTER — Ambulatory Visit: Payer: BC Managed Care – PPO | Admitting: Physical Therapy

## 2020-03-22 ENCOUNTER — Other Ambulatory Visit: Payer: Self-pay

## 2020-03-22 ENCOUNTER — Ambulatory Visit: Payer: BC Managed Care – PPO | Attending: Obstetrics and Gynecology | Admitting: Physical Therapy

## 2020-03-22 ENCOUNTER — Encounter: Payer: Self-pay | Admitting: Physical Therapy

## 2020-03-22 DIAGNOSIS — R252 Cramp and spasm: Secondary | ICD-10-CM | POA: Diagnosis not present

## 2020-03-22 DIAGNOSIS — M6281 Muscle weakness (generalized): Secondary | ICD-10-CM | POA: Diagnosis not present

## 2020-03-22 DIAGNOSIS — M545 Low back pain, unspecified: Secondary | ICD-10-CM

## 2020-03-22 DIAGNOSIS — M533 Sacrococcygeal disorders, not elsewhere classified: Secondary | ICD-10-CM | POA: Insufficient documentation

## 2020-03-22 NOTE — Therapy (Signed)
[facility] [address], [place], [place], [address] Phone: [phone]   Fax:  [phone]  Physical Therapy Treatment  Patient Details  Name: Lauren Small MRN: [id number] Date of Birth: 06-08-1986 Referring Provider (PT): Dr. Lucillie Garfinkel   Encounter Date: 03/22/2020  PT End of Session - 03/22/20 1401    Visit Number  16    Date for PT Re-Evaluation  06/14/20    Authorization Type  bcbs    PT Start Time  1230    PT Stop Time  1315    PT Time Calculation (min)  45 min    Activity Tolerance  Patient tolerated treatment well;No increased pain    Behavior During Therapy  WFL for tasks assessed/performed       Past Medical History:  Diagnosis Date  . Allergy   . Asthma   . Blood in stool   . GERD (gastroesophageal reflux disease)   . History of chicken pox     Past Surgical History:  Procedure Laterality Date  . urethra polyp    . WISDOM TOOTH EXTRACTION      There were no vitals filed for this visit.  Subjective Assessment - 03/22/20 1235    Subjective  The last 2-3 weeks the back and tailbone are worse and felt pain in SPD.    Patient Stated Goals  reduce the tailbone pain, sacral pain    Currently in Pain?  Yes    Pain Score  7     Pain Location  Coccyx    Pain Orientation  Mid    Pain Descriptors / Indicators  Shooting    Pain Type  Acute pain    Pain Onset  More than a month ago    Pain Frequency  Intermittent    Aggravating Factors   sitting on hard surface, bear down for a bowel movement hurts the coccyx    Pain Relieving Factors  heat pad    Multiple Pain Sites  Yes    Pain Score  9    Pain Location  Back    Pain Orientation  Upper;Mid    Pain Descriptors / Indicators  Tightness    Pain Type  Acute pain    Pain Onset  More than a month ago    Pain Frequency  Constant    Aggravating Factors   standing    Pain Relieving Factors  TENS machine, masssage gun         [facility] PT  Assessment - 03/22/20 0001      Assessment   Medical Diagnosis  M54.5 Low back pain; sacral pain    Referring Provider (PT)  Dr. Lucillie Garfinkel    Onset Date/Surgical Date  09/15/19    Prior Therapy  yes      Precautions   Precautions  None      Restrictions   Weight Bearing Restrictions  No      Balance Screen   Has the patient fallen in the past 6 months  No    Has the patient had a decrease in activity level because of a fear of falling?   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    Leisure  work out 5-6 times per week  Cognition   Overall Cognitive Status  Within Functional Limits for tasks assessed      ROM / Strength   AROM / PROM / Strength  AROM;PROM;Strength      AROM   Lumbar Extension  decreased by 50%     Lumbar - Right Side Bend  decreased by 25% with feeling of stickiness in the left lumbar    Lumbar - Right Rotation  decreased by 25%    Lumbar - Left Rotation  full      Strength   Right Hip Flexion  4/5   trunk gives in   Right Hip External Rotation   4/5    Right Hip ABduction  4/5    Left Hip External Rotation  4/5      Palpation   SI assessment   right ilium rotated anteriorly    Palpation comment  decreased buckthandle movement of the lower rib cage; patient will breath and lift the anterior rib cage      Special Tests    Special Tests  Sacrolliac Tests    Sacroiliac Tests   Pelvic Compression      Pelvic Compression   Findings  Positive    Side  Right    comment  pain in pubic and coccyx                Pelvic Floor Special Questions - 03/22/20 0001    Diastasis Recti  2 fingers width above and below the umbilicus; difficulty with not feeling the core engage        [facility] Adult PT Treatment/Exercise - 03/22/20 0001      Neuro Re-ed    Neuro Re-ed Details   breathing  into the rib cage with 360 movement to expand the posterior and lateral rib cage      Lumbar Exercises: Supine   Other Supine Lumbar Exercises  abdominal bracing with engaging the low abdomen with ball press and lowering the rib cage. Patient had difficulty with performing this and her low back would ache      Manual Therapy   Manual Therapy  Soft tissue mobilization;Joint mobilization    Joint Mobilization  PA and rotational mobilization to T4-L5 with sideglide to L1-L5    Soft tissue mobilization  between the posterior rib cage on the intercoastasl, quadratus, thoracic and lumbar paraspinals    Myofascial Release  pull up using the suction cup along the low thoracic and lumbar area to release the fascial       Trigger Point Dry Needling - 03/22/20 0001    Consent Given?  Yes    Education Handout Provided  Previously provided    Muscles Treated Back/Hip  Thoracic multifidi;Lumbar multifidi    Lumbar multifidi Response  Twitch response elicited;Palpable increased muscle length    Thoracic multifidi response  Twitch response elicited;Palpable increased muscle length           PT Education - 03/22/20 1400    Education Details  breathing into rib cage with 360 movement and engaging lower abdomen    Person(s) Educated  Patient    Methods  Explanation;Demonstration    Comprehension  Returned demonstration;Verbalized understanding       PT Short Term Goals - 01/31/20 1621      PT SHORT TERM GOAL #1   Title  independent with initial HEP    Time  4    Period  Weeks    Status  Achieved      PT SHORT  TERM GOAL #2   Title  pelvis stays in correct alignment for 4 weeks due to improved stability    Time  4    Period  Weeks    Status  Achieved    Target Date  12/06/19      PT SHORT TERM GOAL #3   Title  understand correct toileting techniques to improve constipation and ways to manage consitpation    Time  4    Period  Weeks    Status  Achieved      PT SHORT TERM GOAL #4    Title  pain with movement in bed decreased >/= 25%    Time  4    Period  Weeks    Status  Achieved      PT SHORT TERM GOAL #5   Title  coccyx pain with sitting and bowel movements decreased >/= 25%    Time  4    Period  Weeks    Status  Achieved        PT Long Term Goals - 03/22/20 1448      PT LONG TERM GOAL #1   Title  independent with advanced HEP    Time  8    Period  Weeks    Status  On-going      PT LONG TERM GOAL #2   Title  coccyx pain decreased >/= 75% with sitting    Time  8    Period  Weeks    Status  On-going      PT LONG TERM GOAL #3   Title  diastasis recti decreases >/= 2 fingers width with good fascial tension    Time  4    Period  Weeks    Status  Achieved      PT LONG TERM GOAL #4   Title  sitting and standing with pain decreased >/= 75% due to improve stability of SI joint and lumbar area    Time  8    Period  Weeks    Status  On-going      PT LONG TERM GOAL #5   Title  feeling of vagina area feeling heavy and swollen decreased >/= 75% with standing to shop or house work due to improved pelvic floor strength    Time  8    Period  Weeks    Status  On-going            Plan - 03/22/20 1403    Clinical Impression Statement  After manual work patient did not have coccyx pain. Patient has not been to therapy due to therapist cancelling her last appointment for an family emergency and the therapist did not have an opening till later. Patient was doing well until 2 weeks ago when her pain increased. Patient has decreased bilateral hip abduction and trouble lifting left hip into extension due to lifting up her right pelvis. Diastasis is 2 fingers width above and below umbilicus. Patients lower rib cage is flared and decreased movement. Patient has decreased movment of T4-L1 and L5. Patient will benefit from skilled therapy to improve strength, stabilization of the SI jpoint and reducing the coccyx pain.    Personal Factors and Comorbidities   Comorbidity 1;Comorbidity 2    Comorbidities  IBS, diastasis Recti    Examination-Activity Limitations  Caring for Others;Stand;Toileting;Locomotion Level;Transfers;Bend;Sit    Examination-Participation Restrictions  Community Activity;Interpersonal Relationship    Stability/Clinical Decision Making  Evolving/Moderate complexity    Rehab Potential  Excellent  PT Frequency  2x / week    PT Duration  8 weeks    PT Treatment/Interventions  ADLs/Self Care Home Management;Biofeedback;Cryotherapy;Electrical Stimulation;Iontophoresis 4mg /ml Dexamethasone;Moist Heat;Ultrasound;Balance training;Therapeutic exercise;Therapeutic activities;Neuromuscular re-education;Patient/family education;Dry needling;Manual techniques;Taping;Spinal Manipulations    PT Next Visit Plan  check pelvic alignment, work on elongation of the thoraco lumbar paraspinals; abdominal bracing with rib control,    PT Home Exercise Plan  Access Code: [email or web address]    Recommended Other Services  sent MD renewal note    Consulted and Agree with Plan of Care  Patient       Patient will benefit from skilled therapeutic intervention in order to improve the following deficits and impairments:  Decreased coordination, Decreased range of motion, Increased fascial restricitons, Decreased endurance, Increased muscle spasms, Pain, Impaired flexibility, Decreased strength, Decreased mobility  Visit Diagnosis: Coccyx pain - Plan: PT plan of care cert/re-cert  Acute bilateral low back pain without sciatica - Plan: PT plan of care cert/re-cert  Muscle weakness (generalized) - Plan: PT plan of care cert/re-cert  Cramp and spasm - Plan: PT plan of care cert/re-cert     Problem List Patient Active Problem List   Diagnosis Date Noted  . Pregnant 09/15/2019    09/17/2019, PT 03/22/20 4:29 PM   [facility] [address], [address] [place], [place], [place] Phone: [phone]   Fax:   [phone]  Name: Lauren Small MRN: Lysle Morales Date of Birth: 1986-09-07

## 2020-03-29 ENCOUNTER — Other Ambulatory Visit: Payer: Self-pay

## 2020-03-29 ENCOUNTER — Encounter: Payer: Self-pay | Admitting: Physical Therapy

## 2020-03-29 ENCOUNTER — Ambulatory Visit: Payer: BC Managed Care – PPO | Attending: Obstetrics and Gynecology | Admitting: Physical Therapy

## 2020-03-29 DIAGNOSIS — M533 Sacrococcygeal disorders, not elsewhere classified: Secondary | ICD-10-CM

## 2020-03-29 DIAGNOSIS — M6281 Muscle weakness (generalized): Secondary | ICD-10-CM | POA: Insufficient documentation

## 2020-03-29 DIAGNOSIS — R252 Cramp and spasm: Secondary | ICD-10-CM | POA: Diagnosis not present

## 2020-03-29 DIAGNOSIS — M545 Low back pain, unspecified: Secondary | ICD-10-CM

## 2020-03-29 NOTE — Patient Instructions (Signed)
Access Code: [email or web address] URL: [email or web address] Date: 03/29/2020 Prepared by: Eulis Foster  Exercises Bird Dog - 1 x daily - 7 x weekly - 10 reps - 1 sets Half Kneeling Diagonal Lift with Medicine Ball - 1 x daily - 7 x weekly - 10 reps - 1 sets Dead Bug - 1 x daily - 7 x weekly - 10 reps - 1 sets Supine Pelvic Floor Contraction - 2 x daily - 7 x weekly - 1 sets - 5 reps - 5 sec hold Seated Pelvic Floor Contraction - 3 x daily - 7 x weekly - 1 sets - 5 reps - 5 sec hold [facility] [address], [address] [place], [place] [address] Phone # [phone] Fax [phone]

## 2020-03-29 NOTE — Therapy (Signed)
[facility] [address], [place], [place], [address] Phone: [phone]   Fax:  [phone]  Physical Therapy Treatment  Patient Details  Name: Clemence Lengyel MRN: [id number] Date of Birth: 16-Nov-1986 Referring Provider (PT): Dr. Lucillie Garfinkel   Encounter Date: 03/29/2020  PT End of Session - 03/29/20 1013    Visit Number  17    Date for PT Re-Evaluation  06/14/20    Authorization Type  bcbs    PT Start Time  0930    PT Stop Time  1010    PT Time Calculation (min)  40 min    Activity Tolerance  Patient tolerated treatment well;No increased pain    Behavior During Therapy  WFL for tasks assessed/performed       Past Medical History:  Diagnosis Date  . Allergy   . Asthma   . Blood in stool   . GERD (gastroesophageal reflux disease)   . History of chicken pox     Past Surgical History:  Procedure Laterality Date  . urethra polyp    . WISDOM TOOTH EXTRACTION      There were no vitals filed for this visit.  Subjective Assessment - 03/29/20 0935    Subjective  I felt some relief after last visit. I have not had coccyx pain in several days. When I contract the pelvic floor I can feel it.    Patient Stated Goals  reduce the tailbone pain, sacral pain    Currently in Pain?  Yes    Pain Score  4     Pain Location  Coccyx    Pain Orientation  Mid    Pain Descriptors / Indicators  Tightness    Pain Type  Acute pain    Pain Onset  More than a month ago    Pain Frequency  Intermittent    Aggravating Factors   contracting the pelvic    Pain Relieving Factors  heat pad    Multiple Pain Sites  Yes    Pain Score  7    Pain Location  Back    Pain Orientation  Upper;Mid    Pain Descriptors / Indicators  Tightness    Pain Type  Acute pain    Pain Onset  More than a month ago    Pain Frequency  Constant    Aggravating Factors   standing, holding daughter for 5 minutes    Pain Relieving Factors  TENS machine,  massage gun         [facility] PT Assessment - 03/29/20 0001      Palpation   SI assessment   ASIS are equal                   [facility] Adult PT Treatment/Exercise - 03/29/20 0001      Neuro Re-ed    Neuro Re-ed Details   standing and rocking pelvis back and forth to find neutral alignment with less back pressure; pelvic floor contraction without feeling the coccyx pain and engaging the lower abdomen in supine and sitting      Lumbar Exercises: Supine   Dead Bug  20 reps;1 second    Dead Bug Limitations  with focus on not bulging the pelvic floor      Manual Therapy   Manual Therapy  Soft tissue mobilization;Joint mobilization    Joint Mobilization  PA and rotational mobilization to T4-L5 with sideglide to L1-L5    Soft tissue mobilization  bilateral thoracic  and lumbar paraspinals; lay on left side and soft tissue work ot the right quadratus and iliopsoas       Trigger Point Dry Needling - 03/29/20 0001    Consent Given?  Yes    Education Handout Provided  Previously provided    Muscles Treated Back/Hip  Thoracic multifidi   right T11 and T12   Thoracic multifidi response  Twitch response elicited;Palpable increased muscle length           PT Education - 03/29/20 1011    Education Details  Access Code: [email or web address]    Person(s) Educated  Patient    Methods  Explanation;Demonstration;Verbal cues;Handout    Comprehension  Verbalized understanding;Returned demonstration       PT Short Term Goals - 01/31/20 1621      PT SHORT TERM GOAL #1   Title  independent with initial HEP    Time  4    Period  Weeks    Status  Achieved      PT SHORT TERM GOAL #2   Title  pelvis stays in correct alignment for 4 weeks due to improved stability    Time  4    Period  Weeks    Status  Achieved    Target Date  12/06/19      PT SHORT TERM GOAL #3   Title  understand correct toileting techniques to improve constipation and ways to manage consitpation    Time  4    Period   Weeks    Status  Achieved      PT SHORT TERM GOAL #4   Title  pain with movement in bed decreased >/= 25%    Time  4    Period  Weeks    Status  Achieved      PT SHORT TERM GOAL #5   Title  coccyx pain with sitting and bowel movements decreased >/= 25%    Time  4    Period  Weeks    Status  Achieved        PT Long Term Goals - 03/22/20 1448      PT LONG TERM GOAL #1   Title  independent with advanced HEP    Time  8    Period  Weeks    Status  On-going      PT LONG TERM GOAL #2   Title  coccyx pain decreased >/= 75% with sitting    Time  8    Period  Weeks    Status  On-going      PT LONG TERM GOAL #3   Title  diastasis recti decreases >/= 2 fingers width with good fascial tension    Time  4    Period  Weeks    Status  Achieved      PT LONG TERM GOAL #4   Title  sitting and standing with pain decreased >/= 75% due to improve stability of SI joint and lumbar area    Time  8    Period  Weeks    Status  On-going      PT LONG TERM GOAL #5   Title  feeling of vagina area feeling heavy and swollen decreased >/= 75% with standing to shop or house work due to improved pelvic floor strength    Time  8    Period  Weeks    Status  On-going            Plan - 03/29/20 1607  Clinical Impression Statement  Patient has coccyx pain when she contracts the pelvic floor so she has learned how to isolate the anterior portion of the pelvic floor without coccyx pain. Patient understands how to not bulge the abdomen with her workout and she has to scale down the exericses to isolate. Patient has not had the constant coccyx pain for several days. Patient diastasis is 2 fingers width. Patient had a muscle spasm on the right low thoracic multifid that needed soft tissue work to release the spasm. Patient will benefit from skilled therapy to improve strength, stabilization of the SI joint and reducing the coccyx pain.    Personal Factors and Comorbidities  Comorbidity 1;Comorbidity 2     Comorbidities  IBS, diastasis Recti    Examination-Activity Limitations  Caring for Others;Stand;Toileting;Locomotion Level;Transfers;Bend;Sit    Examination-Participation Restrictions  Community Activity;Interpersonal Relationship    Stability/Clinical Decision Making  Evolving/Moderate complexity    Rehab Potential  Excellent    PT Frequency  2x / week    PT Duration  8 weeks    PT Treatment/Interventions  ADLs/Self Care Home Management;Biofeedback;Cryotherapy;Electrical Stimulation;Iontophoresis 4mg /ml Dexamethasone;Moist Heat;Ultrasound;Balance training;Therapeutic exercise;Therapeutic activities;Neuromuscular re-education;Patient/family education;Dry needling;Manual techniques;Taping;Spinal Manipulations    PT Next Visit Plan  work on advanced core exercise, contracting the pelvic floor correctly, release the coccyxgeus    PT Home Exercise Plan  Access Code: [email or web address]    Consulted and Agree with Plan of Care  Patient       Patient will benefit from skilled therapeutic intervention in order to improve the following deficits and impairments:  Decreased coordination, Decreased range of motion, Increased fascial restricitons, Decreased endurance, Increased muscle spasms, Pain, Impaired flexibility, Decreased strength, Decreased mobility  Visit Diagnosis: Coccyx pain  Acute bilateral low back pain without sciatica  Muscle weakness (generalized)  Cramp and spasm     Problem List Patient Active Problem List   Diagnosis Date Noted  . Pregnant 09/15/2019    09/17/2019, PT 03/29/20 10:17 AM   [facility] [address] [place], [place], [place] Phone: [phone]   Fax:  [phone]  Name: Loxley Cibrian MRN: Lysle Morales Date of Birth: April 02, 1986

## 2020-04-03 ENCOUNTER — Encounter: Payer: Self-pay | Admitting: Physical Therapy

## 2020-04-03 ENCOUNTER — Other Ambulatory Visit: Payer: Self-pay

## 2020-04-03 ENCOUNTER — Ambulatory Visit: Payer: BC Managed Care – PPO | Admitting: Physical Therapy

## 2020-04-03 DIAGNOSIS — M6281 Muscle weakness (generalized): Secondary | ICD-10-CM | POA: Diagnosis not present

## 2020-04-03 DIAGNOSIS — M533 Sacrococcygeal disorders, not elsewhere classified: Secondary | ICD-10-CM

## 2020-04-03 DIAGNOSIS — M545 Low back pain, unspecified: Secondary | ICD-10-CM

## 2020-04-03 DIAGNOSIS — R252 Cramp and spasm: Secondary | ICD-10-CM

## 2020-04-03 NOTE — Therapy (Signed)
[facility] [address], [address] [place], [place], [address] Phone: [phone]   Fax:  [phone]  Physical Therapy Treatment  Patient Details  Name: Lauren Small MRN: [id number] Date of Birth: 09-Apr-1986 Referring Provider (PT): Dr. Belva Agee   Encounter Date: 04/03/2020  PT End of Session - 04/03/20 1234    Visit Number  18    Date for PT Re-Evaluation  06/14/20    Authorization Type  bcbs    PT Start Time  1145    PT Stop Time  1225    PT Time Calculation (min)  40 min    Activity Tolerance  Patient tolerated treatment well;No increased pain    Behavior During Therapy  WFL for tasks assessed/performed       Past Medical History:  Diagnosis Date  . Allergy   . Asthma   . Blood in stool   . GERD (gastroesophageal reflux disease)   . History of chicken pox     Past Surgical History:  Procedure Laterality Date  . urethra polyp    . WISDOM TOOTH EXTRACTION      There were no vitals filed for this visit.  Subjective Assessment - 04/03/20 1145    Subjective  I had some back spasms since last visit. I am not sure if I am engaging my lower abdominals better.    Patient Stated Goals  reduce the tailbone pain, sacral pain    Currently in Pain?  Yes    Pain Score  5     Pain Location  Coccyx    Pain Orientation  Mid    Pain Descriptors / Indicators  Tightness    Pain Type  Acute pain    Pain Onset  More than a month ago    Pain Frequency  Intermittent    Aggravating Factors   contracting the pelvic    Pain Relieving Factors  heat pad    Multiple Pain Sites  Yes    Pain Score  6    Pain Location  Back    Pain Orientation  Upper;Mid    Pain Descriptors / Indicators  Tightness    Pain Type  Acute pain    Pain Onset  More than a month ago    Pain Frequency  Constant    Aggravating Factors   standing, holding daughter for 5 minutes    Pain Relieving Factors  TENS machine, massage gun                        [facility] Adult PT Treatment/Exercise - 04/03/20 0001      Lumbar Exercises: Standing   Other Standing Lumbar Exercises  squat with holding onto pole with squat and red band around the thighs to keep spinal neutral without increased coccyx pain      Lumbar Exercises: Seated   Other Seated Lumbar Exercises  sitting with hands on the outside rib cage and breath out while bringing ribs downward.      Lumbar Exercises: Supine   Other Supine Lumbar Exercises  lay down with feet on the wall and contract the abodminals keeping the hamstring relaxed       Manual Therapy   Manual Therapy  Soft tissue mobilization;Joint mobilization    Joint Mobilization  PA and rotational to T4-T12; rotational mobilizaiton to L5; sacral mobilization to correct the left rotation    Soft tissue mobilization  around the coccyx, along the ishcial tuberosity, elongation  of the coccygeus             PT Education - 04/03/20 1233    Education Details  squat, 90/90 breathing, rib cage mobilizaiton in sitting    Person(s) Educated  Patient    Methods  Explanation;Demonstration;Verbal cues;Handout    Comprehension  Verbalized understanding;Returned demonstration       PT Short Term Goals - 01/31/20 1621      PT SHORT TERM GOAL #1   Title  independent with initial HEP    Time  4    Period  Weeks    Status  Achieved      PT SHORT TERM GOAL #2   Title  pelvis stays in correct alignment for 4 weeks due to improved stability    Time  4    Period  Weeks    Status  Achieved    Target Date  12/06/19      PT SHORT TERM GOAL #3   Title  understand correct toileting techniques to improve constipation and ways to manage consitpation    Time  4    Period  Weeks    Status  Achieved      PT SHORT TERM GOAL #4   Title  pain with movement in bed decreased >/= 25%    Time  4    Period  Weeks    Status  Achieved      PT SHORT TERM GOAL #5   Title  coccyx pain with sitting and  bowel movements decreased >/= 25%    Time  4    Period  Weeks    Status  Achieved        PT Long Term Goals - 04/03/20 1328      PT LONG TERM GOAL #1   Title  independent with advanced HEP    Time  8    Period  Weeks    Status  On-going      PT LONG TERM GOAL #2   Title  coccyx pain decreased >/= 75% with sitting    Baseline  20% but depends on the surface    Time  8    Period  Weeks    Status  On-going      PT LONG TERM GOAL #3   Title  diastasis recti decreases >/= 2 fingers width with good fascial tension    Time  4    Period  Weeks    Status  Achieved      PT LONG TERM GOAL #4   Title  sitting and standing with pain decreased >/= 75% due to improve stability of SI joint and lumbar area    Baseline  standing for the coccyx and SI  decreased by 90% better; increased pain with back    Time  8    Period  Weeks    Status  On-going      PT LONG TERM GOAL #5   Title  feeling of vagina area feeling heavy and swollen decreased >/= 75% with standing to shop or house work due to improved pelvic floor strength    Baseline  75% better    Time  8    Period  Weeks    Status  On-going            Plan - 04/03/20 1241    Clinical Impression Statement  Patient had increased back spasms with the dry needling. Patient is able to engage her abdominals correctly with 90/90  breathing but continues to need work. Patient has back tightness and groin with deep squat. Patient is starting to feel her gluteals work. Patient has no pain at end of therapy. Patient is now running and doing a couch to 5k exercise program. Patient continues to need core strength. Patient will benefit from skilled therapy to improve strength, stabilization of the SI joint and reducing the coccyx pain.    Personal Factors and Comorbidities  Comorbidity 1;Comorbidity 2    Comorbidities  IBS, diastasis Recti    Examination-Activity Limitations  Caring for Others;Stand;Toileting;Locomotion Level;Transfers;Bend;Sit     Examination-Participation Restrictions  Community Activity;Interpersonal Relationship    Stability/Clinical Decision Making  Evolving/Moderate complexity    Rehab Potential  Excellent    PT Frequency  2x / week    PT Duration  8 weeks    PT Treatment/Interventions  ADLs/Self Care Home Management;Biofeedback;Cryotherapy;Electrical Stimulation;Iontophoresis 4mg /ml Dexamethasone;Moist Heat;Ultrasound;Balance training;Therapeutic exercise;Therapeutic activities;Neuromuscular re-education;Patient/family education;Dry needling;Manual techniques;Taping;Spinal Manipulations    PT Next Visit Plan  work on postior movement of the hip, exercise to relax the gluteals, advance the abdominal, manual work to back and check ASIS alignment    PT Home Exercise Plan  Access Code: [email or web address]    Recommended Other Services  MD signed renewal    Consulted and Agree with Plan of Care  Patient       Patient will benefit from skilled therapeutic intervention in order to improve the following deficits and impairments:  Decreased coordination, Decreased range of motion, Increased fascial restricitons, Decreased endurance, Increased muscle spasms, Pain, Impaired flexibility, Decreased strength, Decreased mobility  Visit Diagnosis: Coccyx pain  Acute bilateral low back pain without sciatica  Muscle weakness (generalized)  Cramp and spasm     Problem List Patient Active Problem List   Diagnosis Date Noted  . Pregnant 09/15/2019    09/17/2019, PT 04/03/20 1:29 PM   [facility] [address] [place], [place], [place] Phone: [phone]   Fax:  [phone]  Name: Lauren Small MRN: Lysle Morales Date of Birth: 02/04/1986

## 2020-04-13 ENCOUNTER — Encounter: Payer: BC Managed Care – PPO | Admitting: Physical Therapy

## 2020-04-13 ENCOUNTER — Ambulatory Visit: Payer: BC Managed Care – PPO | Admitting: Physical Therapy

## 2020-04-13 ENCOUNTER — Other Ambulatory Visit: Payer: Self-pay

## 2020-04-13 ENCOUNTER — Encounter: Payer: Self-pay | Admitting: Physical Therapy

## 2020-04-13 DIAGNOSIS — M545 Low back pain, unspecified: Secondary | ICD-10-CM

## 2020-04-13 DIAGNOSIS — M533 Sacrococcygeal disorders, not elsewhere classified: Secondary | ICD-10-CM

## 2020-04-13 DIAGNOSIS — M6281 Muscle weakness (generalized): Secondary | ICD-10-CM

## 2020-04-13 DIAGNOSIS — R252 Cramp and spasm: Secondary | ICD-10-CM

## 2020-04-13 NOTE — Patient Instructions (Signed)
Access Code: [email or web address] URL: [email or web address] Date: 04/13/2020 Prepared by: Eulis Foster  Exercises Bird Dog - 1 x daily - 7 x weekly - 10 reps - 1 sets Half Kneeling Diagonal Lift with Medicine Ball - 1 x daily - 7 x weekly - 10 reps - 1 sets Dead Bug - 1 x daily - 7 x weekly - 10 reps - 1 sets Supine Pelvic Floor Contraction - 2 x daily - 7 x weekly - 1 sets - 5 reps - 5 sec hold Seated Pelvic Floor Contraction - 3 x daily - 7 x weekly - 1 sets - 5 reps - 5 sec hold Prone Hip Extension - Two Pillows - 1 x daily - 3 x weekly - 1 sets - 10 reps - 1 sec hold Prone Shoulder Horizontal Abduction on Swiss Ball - 1 x daily - 3 x weekly - 3 sets - 10 reps Squat with TRX&Acirc;&reg; - 1 x daily - 7 x weekly - 1 sets - 10 reps Thoracic Mobilization on Foam Roll - Hands Clasped - 1 x daily - 7 x weekly - 10 reps - 3 sets Thoracic Mobilization with Foam Roll - 1 x daily - 7 x weekly - 10 reps - 3 sets [facility] [address] [place], [place] [address] Phone # [phone] Fax [phone]

## 2020-04-13 NOTE — Therapy (Signed)
[facility] [address], [place], [place], [address] Phone: [phone]   Fax:  [phone]  Physical Therapy Treatment  Patient Details  Name: Lauren Small MRN: [id number] Date of Birth: 1986-07-19 Referring Provider (PT): Dr. Lucillie Garfinkel   Encounter Date: 04/13/2020  PT End of Session - 04/13/20 1402    Visit Number  19    Date for PT Re-Evaluation  06/14/20    Authorization Type  bcbs    PT Start Time  1402    PT Stop Time  1440    PT Time Calculation (min)  38 min    Activity Tolerance  Patient tolerated treatment well;No increased pain    Behavior During Therapy  WFL for tasks assessed/performed       Past Medical History:  Diagnosis Date  . Allergy   . Asthma   . Blood in stool   . GERD (gastroesophageal reflux disease)   . History of chicken pox     Past Surgical History:  Procedure Laterality Date  . urethra polyp    . WISDOM TOOTH EXTRACTION      There were no vitals filed for this visit.  Subjective Assessment - 04/13/20 1403    Subjective  All issues are improving. The coccyx pain is not consistent.    Patient Stated Goals  reduce the tailbone pain, sacral pain    Currently in Pain?  Yes    Pain Score  4     Pain Location  Coccyx    Pain Orientation  Mid    Pain Descriptors / Indicators  Tightness    Pain Type  Acute pain    Pain Onset  More than a month ago    Pain Frequency  Intermittent    Aggravating Factors   when pressure on the coccyx    Pain Relieving Factors  heat pad    Multiple Pain Sites  Yes    Pain Score  6    Pain Location  Back    Pain Orientation  Upper;Mid    Pain Descriptors / Indicators  Tightness    Pain Type  Acute pain    Pain Onset  More than a month ago    Pain Frequency  Constant    Aggravating Factors   standing, holding daughter for 5 minutes    Pain Relieving Factors  TENS machine, massage gun                       [facility] Adult  PT Treatment/Exercise - 04/13/20 0001      Lumbar Exercises: Standing   Other Standing Lumbar Exercises  squat with holding onto pole with squat and red band around the thighs to keep spinal neutral without increased coccyx pain      Lumbar Exercises: Seated   Other Seated Lumbar Exercises  sitting with hands on the outside rib cage and breath out while bringing ribs downward.      Lumbar Exercises: Prone   Straight Leg Raise  10 reps;1 second    Straight Leg Raises Limitations  each side with abdominals contracted    Other Prone Lumbar Exercises  prone on ball horizontal shoulder abduction with 1 #    Other Prone Lumbar Exercises  plank roll the ball keeping spinal neutral      Manual Therapy   Manual Therapy  Joint mobilization;Soft tissue mobilization    Joint Mobilization  PA rotation to L5 S!; sacral rotation and  sidebending; sidely gapping the thoracic lumbar junction    Soft tissue mobilization  left side of coccyx working on coccygeus             PT Education - 04/13/20 1445    Education Details  Access Code: [email or web address]    Person(s) Educated  Patient    Methods  Explanation;Demonstration;Verbal cues;Handout    Comprehension  Returned demonstration;Verbalized understanding       PT Short Term Goals - 01/31/20 1621      PT SHORT TERM GOAL #1   Title  independent with initial HEP    Time  4    Period  Weeks    Status  Achieved      PT SHORT TERM GOAL #2   Title  pelvis stays in correct alignment for 4 weeks due to improved stability    Time  4    Period  Weeks    Status  Achieved    Target Date  12/06/19      PT SHORT TERM GOAL #3   Title  understand correct toileting techniques to improve constipation and ways to manage consitpation    Time  4    Period  Weeks    Status  Achieved      PT SHORT TERM GOAL #4   Title  pain with movement in bed decreased >/= 25%    Time  4    Period  Weeks    Status  Achieved      PT SHORT TERM GOAL #5   Title  coccyx  pain with sitting and bowel movements decreased >/= 25%    Time  4    Period  Weeks    Status  Achieved        PT Long Term Goals - 04/03/20 1328      PT LONG TERM GOAL #1   Title  independent with advanced HEP    Time  8    Period  Weeks    Status  On-going      PT LONG TERM GOAL #2   Title  coccyx pain decreased >/= 75% with sitting    Baseline  20% but depends on the surface    Time  8    Period  Weeks    Status  On-going      PT LONG TERM GOAL #3   Title  diastasis recti decreases >/= 2 fingers width with good fascial tension    Time  4    Period  Weeks    Status  Achieved      PT LONG TERM GOAL #4   Title  sitting and standing with pain decreased >/= 75% due to improve stability of SI joint and lumbar area    Baseline  standing for the coccyx and SI  decreased by 90% better; increased pain with back    Time  8    Period  Weeks    Status  On-going      PT LONG TERM GOAL #5   Title  feeling of vagina area feeling heavy and swollen decreased >/= 75% with standing to shop or house work due to improved pelvic floor strength    Baseline  75% better    Time  8    Period  Weeks    Status  On-going            Plan - 04/13/20 1423    Clinical Impression Statement  Patient pelvis in correct alignment after  manual work. Patient has difficulty with PLANK on rolling ball due to weak core and needs tactile cues to not bulge abdomen. Patient had difficulty with left hip extension until she would engage the left gluteal first and the core. Patient is able to do the squat better. Patient is learning how to strengthen her thoracic area to reduce the pain in standing and when she is carrying her children. L5 was tight. Patient coccyx pain is less often. Patient will benefit from skilled therapy to improve strength, stabilization of the SI joint and reduing coccyx pain.    Personal Factors and Comorbidities  Comorbidity 1;Comorbidity 2    Comorbidities  IBS, diastasis Recti     Examination-Activity Limitations  Caring for Others;Stand;Toileting;Locomotion Level;Transfers;Bend;Sit    Examination-Participation Restrictions  Community Activity;Interpersonal Relationship    Stability/Clinical Decision Making  Evolving/Moderate complexity    Rehab Potential  Excellent    PT Frequency  2x / week    PT Duration  8 weeks    PT Treatment/Interventions  ADLs/Self Care Home Management;Biofeedback;Cryotherapy;Electrical Stimulation;Iontophoresis 4mg /ml Dexamethasone;Moist Heat;Ultrasound;Balance training;Therapeutic exercise;Therapeutic activities;Neuromuscular re-education;Patient/family education;Dry needling;Manual techniques;Taping;Spinal Manipulations    PT Next Visit Plan  work on postior movement of the hip, exercise to relax the gluteals, advance the abdominal, manual work to back and check ASIS alignment    PT Home Exercise Plan  Access Code: [email or web address]    Consulted and Agree with Plan of Care  Patient       Patient will benefit from skilled therapeutic intervention in order to improve the following deficits and impairments:  Decreased coordination, Decreased range of motion, Increased fascial restricitons, Decreased endurance, Increased muscle spasms, Pain, Impaired flexibility, Decreased strength, Decreased mobility  Visit Diagnosis: Coccyx pain  Acute bilateral low back pain without sciatica  Muscle weakness (generalized)  Cramp and spasm     Problem List Patient Active Problem List   Diagnosis Date Noted  . Pregnant 09/15/2019    09/17/2019, PT 04/13/20 5:13 PM   [facility] [address] [place], [place], [place] Phone: [phone]   Fax:  [phone]  Name: Lauren Small MRN: Lysle Morales Date of Birth: 1986/02/13

## 2020-04-17 ENCOUNTER — Other Ambulatory Visit: Payer: Self-pay

## 2020-04-17 ENCOUNTER — Ambulatory Visit: Payer: BC Managed Care – PPO | Admitting: Physical Therapy

## 2020-04-17 ENCOUNTER — Encounter: Payer: Self-pay | Admitting: Physical Therapy

## 2020-04-17 DIAGNOSIS — M533 Sacrococcygeal disorders, not elsewhere classified: Secondary | ICD-10-CM | POA: Diagnosis not present

## 2020-04-17 DIAGNOSIS — R252 Cramp and spasm: Secondary | ICD-10-CM | POA: Diagnosis not present

## 2020-04-17 DIAGNOSIS — M545 Low back pain, unspecified: Secondary | ICD-10-CM

## 2020-04-17 DIAGNOSIS — M6281 Muscle weakness (generalized): Secondary | ICD-10-CM | POA: Diagnosis not present

## 2020-04-17 NOTE — Therapy (Signed)
[facility] [address], [address] [place], [place], [address] Phone: [phone]   Fax:  [phone]  Physical Therapy Treatment  Patient Details  Name: Lauren Small MRN: [id number] Date of Birth: October 10, 1986 Referring Provider (PT): Dr. Belva Agee   Encounter Date: 04/17/2020  PT End of Session - 04/17/20 1535    Visit Number  20    Date for PT Re-Evaluation  06/14/20    Authorization Type  bcbs    PT Start Time  1145    PT Stop Time  1225    PT Time Calculation (min)  40 min    Activity Tolerance  Patient tolerated treatment well;No increased pain    Behavior During Therapy  WFL for tasks assessed/performed       Past Medical History:  Diagnosis Date  . Allergy   . Asthma   . Blood in stool   . GERD (gastroesophageal reflux disease)   . History of chicken pox     Past Surgical History:  Procedure Laterality Date  . urethra polyp    . WISDOM TOOTH EXTRACTION      There were no vitals filed for this visit.  Subjective Assessment - 04/17/20 1534    Subjective  I am feeling better. Not having pain just tightness in my back. I am constipated.    Patient Stated Goals  reduce the tailbone pain, sacral pain    Currently in Pain?  No/denies                       [facility] Adult PT Treatment/Exercise - 04/17/20 0001      Lumbar Exercises: Stretches   Quadruped Mid Back Stretch  1 rep;30 seconds      Lumbar Exercises: Supine   Ab Set  10 reps;5 seconds    AB Set Limitations  wtih ball squeeze to engage the lower TA  and reduce her diastasis recti    Dead Bug  20 reps;1 second      Lumbar Exercises: Quadruped   Single Arm Raise  Right;Left;10 reps    Straight Leg Raise  10 reps    Straight Leg Raises Limitations  right then left    Opposite Arm/Leg Raise  Right arm/Left leg;Left arm/Right leg;10 reps      Manual Therapy   Manual Therapy  Soft tissue mobilization    Soft tissue mobilization   circular massage to the abdomen to improve peristalic motion of the intestines and reduce constipation               PT Short Term Goals - 01/31/20 1621      PT SHORT TERM GOAL #1   Title  independent with initial HEP    Time  4    Period  Weeks    Status  Achieved      PT SHORT TERM GOAL #2   Title  pelvis stays in correct alignment for 4 weeks due to improved stability    Time  4    Period  Weeks    Status  Achieved    Target Date  12/06/19      PT SHORT TERM GOAL #3   Title  understand correct toileting techniques to improve constipation and ways to manage consitpation    Time  4    Period  Weeks    Status  Achieved      PT SHORT TERM GOAL #4   Title  pain with movement  in bed decreased >/= 25%    Time  4    Period  Weeks    Status  Achieved      PT SHORT TERM GOAL #5   Title  coccyx pain with sitting and bowel movements decreased >/= 25%    Time  4    Period  Weeks    Status  Achieved        PT Long Term Goals - 04/17/20 1706      PT LONG TERM GOAL #1   Title  independent with advanced HEP    Time  8    Period  Weeks    Status  On-going      PT LONG TERM GOAL #2   Title  coccyx pain decreased >/= 75% with sitting    Baseline  was better this week    Time  8    Period  Weeks    Status  On-going      PT LONG TERM GOAL #3   Title  diastasis recti decreases >/= 2 fingers width with good fascial tension    Time  4    Period  Weeks    Status  Achieved      PT LONG TERM GOAL #4   Title  sitting and standing with pain decreased >/= 75% due to improve stability of SI joint and lumbar area    Baseline  standing for the coccyx and SI  decreased by 90% better; increased pain with back    Period  Weeks    Status  On-going      PT LONG TERM GOAL #5   Title  feeling of vagina area feeling heavy and swollen decreased >/= 75% with standing to shop or house work due to improved pelvic floor strength    Baseline  75% better    Time  8    Period  Weeks     Status  On-going            Plan - 04/17/20 1535    Clinical Impression Statement  Patient is only having back tightness. Patient would have coccyx pain when she tightens more of the posterior vaginal wall compared to the anterior. Patient needs tactile cues to contract the lower TA more and has more trouble with the right side. Patient needs tactile cues to tighten the core with exercises in quadruped. Patient will be sending a video of her running so we are able to go over changes to reduce strain on the pelvic floor. Patient will benefit from skilled therapy to improve strength, stabilization of the SI joint and reducing coccyx pain.    Personal Factors and Comorbidities  Comorbidity 1;Comorbidity 2    Comorbidities  IBS, diastasis Recti    Examination-Activity Limitations  Caring for Others;Stand;Toileting;Locomotion Level;Transfers;Bend;Sit    Examination-Participation Restrictions  Community Activity;Interpersonal Relationship    Stability/Clinical Decision Making  Evolving/Moderate complexity    Rehab Potential  Excellent    PT Frequency  2x / week    PT Duration  8 weeks    PT Treatment/Interventions  ADLs/Self Care Home Management;Biofeedback;Cryotherapy;Electrical Stimulation;Iontophoresis 4mg /ml Dexamethasone;Moist Heat;Ultrasound;Balance training;Therapeutic exercise;Therapeutic activities;Neuromuscular re-education;Patient/family education;Dry needling;Manual techniques;Taping;Spinal Manipulations    PT Next Visit Plan  work on postior movement of the hip, exercise to relax the gluteals, advance the abdominal, manual work to back and check ASIS alignment    PT Home Exercise Plan  Access Code: [email or web address]    Consulted and Agree with Plan of Care  Patient  Patient will benefit from skilled therapeutic intervention in order to improve the following deficits and impairments:  Decreased coordination, Decreased range of motion, Increased fascial restricitons, Decreased  endurance, Increased muscle spasms, Pain, Impaired flexibility, Decreased strength, Decreased mobility  Visit Diagnosis: Coccyx pain  Acute bilateral low back pain without sciatica  Cramp and spasm     Problem List Patient Active Problem List   Diagnosis Date Noted  . Pregnant 09/15/2019    Eulis Foster, PT 04/17/20 5:08 PM   [facility] [address] [place], [place], [address] Phone: [phone]   Fax:  [phone]  Name: Nicholl Onstott MRN: [id number] Date of Birth: 01-04-1986

## 2020-04-21 ENCOUNTER — Encounter: Payer: BC Managed Care – PPO | Admitting: Family Medicine

## 2020-04-24 ENCOUNTER — Other Ambulatory Visit: Payer: Self-pay

## 2020-04-24 ENCOUNTER — Ambulatory Visit (INDEPENDENT_AMBULATORY_CARE_PROVIDER_SITE_OTHER): Payer: BC Managed Care – PPO | Admitting: Family Medicine

## 2020-04-24 ENCOUNTER — Encounter: Payer: Self-pay | Admitting: Family Medicine

## 2020-04-24 VITALS — BP 108/80 | HR 65 | Temp 97.9°F | Resp 16 | Ht 66.5 in | Wt 163.4 lb

## 2020-04-24 DIAGNOSIS — E663 Overweight: Secondary | ICD-10-CM | POA: Diagnosis not present

## 2020-04-24 DIAGNOSIS — Z0000 Encounter for general adult medical examination without abnormal findings: Secondary | ICD-10-CM | POA: Diagnosis not present

## 2020-04-24 LAB — CBC WITH DIFFERENTIAL/PLATELET
Basophils Absolute: 0 10*3/uL (ref 0.0–0.1)
Basophils Relative: 0.7 % (ref 0.0–3.0)
Eosinophils Absolute: 0.2 10*3/uL (ref 0.0–0.7)
Eosinophils Relative: 3.9 % (ref 0.0–5.0)
HCT: 38.4 % (ref 36.0–46.0)
Hemoglobin: 13.1 g/dL (ref 12.0–15.0)
Lymphocytes Relative: 37.8 % (ref 12.0–46.0)
Lymphs Abs: 1.5 10*3/uL (ref 0.7–4.0)
MCHC: 34.1 g/dL (ref 30.0–36.0)
MCV: 90.4 fl (ref 78.0–100.0)
Monocytes Absolute: 0.2 10*3/uL (ref 0.1–1.0)
Monocytes Relative: 5.1 % (ref 3.0–12.0)
Neutro Abs: 2.1 10*3/uL (ref 1.4–7.7)
Neutrophils Relative %: 52.5 % (ref 43.0–77.0)
Platelets: 267 10*3/uL (ref 150.0–400.0)
RBC: 4.24 Mil/uL (ref 3.87–5.11)
RDW: 13.4 % (ref 11.5–15.5)
WBC: 4.1 10*3/uL (ref 4.0–10.5)

## 2020-04-24 LAB — BASIC METABOLIC PANEL
BUN: 11 mg/dL (ref 6–23)
CO2: 25 mEq/L (ref 19–32)
Calcium: 9.6 mg/dL (ref 8.4–10.5)
Chloride: 106 mEq/L (ref 96–112)
Creatinine, Ser: 0.75 mg/dL (ref 0.40–1.20)
GFR: 87.84 mL/min (ref >60.00)
Glucose, Bld: 90 mg/dL (ref 70–99)
Potassium: 4.1 mEq/L (ref 3.5–5.1)
Sodium: 139 mEq/L (ref 135–145)

## 2020-04-24 LAB — LIPID PANEL
Cholesterol: 183 mg/dL (ref 0–200)
HDL: 48.6 mg/dL (ref >39.00)
LDL Cholesterol: 117 mg/dL — ABNORMAL HIGH (ref 0–99)
NonHDL: 133.95
Total CHOL/HDL Ratio: 4
Triglycerides: 83 mg/dL (ref 0.0–149.0)
VLDL: 16.6 mg/dL (ref 0.0–40.0)

## 2020-04-24 LAB — HEPATIC FUNCTION PANEL
ALT: 12 U/L (ref 0–35)
AST: 12 U/L (ref 0–37)
Albumin: 4.7 g/dL (ref 3.5–5.2)
Alkaline Phosphatase: 47 U/L (ref 39–117)
Bilirubin, Direct: 0.2 mg/dL (ref 0.0–0.3)
Total Bilirubin: 1.3 mg/dL — ABNORMAL HIGH (ref 0.2–1.2)
Total Protein: 6.9 g/dL (ref 6.0–8.3)

## 2020-04-24 LAB — TSH: TSH: 1.4 u[IU]/mL (ref 0.35–4.50)

## 2020-04-24 MED ORDER — ALBUTEROL SULFATE HFA 108 (90 BASE) MCG/ACT IN AERS: 2.0000 | INHALATION_SPRAY | Freq: Four times a day (QID) | RESPIRATORY_TRACT | 3 refills | Status: DC | PRN

## 2020-04-24 NOTE — Assessment & Plan Note (Signed)
Pt's PE WNL.  UTD on GYN, immunizations.  Check labs.  Anticipatory guidance provided.  

## 2020-04-24 NOTE — Patient Instructions (Signed)
Follow up in 1 year or as needed We'll notify you of your lab results and make any changes if needed Keep up the good work on healthy diet and regular exercise- you look great! Call with any questions or concerns Happy Spring!!! 

## 2020-04-24 NOTE — Progress Notes (Signed)
   Subjective:    Patient ID: Lauren Small, female    DOB: 02/24/1986, 34 y.o.   MRN: [id number]  HPI CPE- UTD on GYN, immunizations.  No concerns today   Review of Systems Patient reports no vision/ hearing changes, adenopathy,fever, weight change,  persistant/recurrent hoarseness , swallowing issues, chest pain, palpitations, edema, persistant/recurrent cough, hemoptysis, dyspnea (rest/exertional/paroxysmal nocturnal), gastrointestinal bleeding (melena, rectal bleeding), abdominal pain, significant heartburn, bowel changes, GU symptoms (dysuria, hematuria, incontinence), Gyn symptoms (abnormal  bleeding, pain),  syncope, focal weakness, memory loss, numbness & tingling, skin/hair/nail changes, abnormal bruising or bleeding, anxiety, or depression.   This visit occurred during the SARS-CoV-2 public health emergency.  Safety protocols were in place, including screening questions prior to the visit, additional usage of staff PPE, and extensive cleaning of exam room while observing appropriate contact time as indicated for disinfecting solutions.       Objective:   Physical Exam General Appearance:    Alert, cooperative, no distress, appears stated age  Head:    Normocephalic, without obvious abnormality, atraumatic  Eyes:    PERRL, conjunctiva/corneas clear, EOM's intact, fundi    benign, both eyes  Ears:    Normal TM's and external ear canals, both ears  Nose:   Deferred due to COVID  Throat:   Neck:   Supple, symmetrical, trachea midline, no adenopathy;    Thyroid: no enlargement/tenderness/nodules  Back:     Symmetric, no curvature, ROM normal, no CVA tenderness  Lungs:     Clear to auscultation bilaterally, respirations unlabored  Chest Wall:    No tenderness or deformity   Heart:    Regular rate and rhythm, S1 and S2 normal, no murmur, rub   or gallop  Breast Exam:    Deferred to GYN  Abdomen:     Soft, non-tender, bowel sounds active all four quadrants,    no masses, no  organomegaly  Genitalia:    Deferred to GYN  Rectal:    Extremities:   Extremities normal, atraumatic, no cyanosis or edema  Pulses:   2+ and symmetric all extremities  Skin:   Skin color, texture, turgor normal, no rashes or lesions  Lymph nodes:   Cervical, supraclavicular, and axillary nodes normal  Neurologic:   CNII-XII intact, normal strength, sensation and reflexes    throughout          Assessment & Plan:

## 2020-04-25 ENCOUNTER — Encounter: Payer: Self-pay | Admitting: Family Medicine

## 2020-05-15 ENCOUNTER — Ambulatory Visit: Payer: BC Managed Care – PPO | Attending: Obstetrics and Gynecology | Admitting: Physical Therapy

## 2020-05-15 ENCOUNTER — Encounter: Payer: Self-pay | Admitting: Physical Therapy

## 2020-05-15 ENCOUNTER — Other Ambulatory Visit: Payer: Self-pay

## 2020-05-15 DIAGNOSIS — M545 Low back pain, unspecified: Secondary | ICD-10-CM

## 2020-05-15 DIAGNOSIS — M533 Sacrococcygeal disorders, not elsewhere classified: Secondary | ICD-10-CM

## 2020-05-15 DIAGNOSIS — R252 Cramp and spasm: Secondary | ICD-10-CM | POA: Diagnosis not present

## 2020-05-15 DIAGNOSIS — M6281 Muscle weakness (generalized): Secondary | ICD-10-CM | POA: Diagnosis not present

## 2020-05-15 NOTE — Therapy (Signed)
[facility] [address], [place], [place], [address] Phone: [phone]   Fax:  [phone]  Physical Therapy Treatment  Patient Details  Name: Lauren Small MRN: [id number] Date of Birth: Mar 16, 1986 Referring Provider (PT): Dr. Lucillie Garfinkel   Encounter Date: 05/15/2020  PT End of Session - 05/15/20 0808    Visit Number  21    Date for PT Re-Evaluation  06/14/20    Authorization Type  bcbs    PT Start Time  0800    PT Stop Time  0840    PT Time Calculation (min)  40 min    Activity Tolerance  Patient tolerated treatment well;No increased pain    Behavior During Therapy  WFL for tasks assessed/performed       Past Medical History:  Diagnosis Date  . Allergy   . Asthma   . Blood in stool   . GERD (gastroesophageal reflux disease)   . History of chicken pox     Past Surgical History:  Procedure Laterality Date  . urethra polyp    . WISDOM TOOTH EXTRACTION      There were no vitals filed for this visit.  Subjective Assessment - 05/15/20 0804    Subjective  I have been on vacation for 2 weeks. Everything feels good. I am still doing the running program. Whe running I would feel something click in the pelvis area then shooting pain down the lwg. One day the SPD was flared up bad. . Right side of back into right hip is sore. When I run anc cough I leak urine. I have not had a bowel movement in 2 weeks. I have been tight in the sits bone and pain in the coccyx.    Patient Stated Goals  reduce the tailbone pain, sacral pain    Currently in Pain?  Yes    Pain Score  6     Pain Location  Coccyx    Pain Orientation  Mid    Pain Descriptors / Indicators  Tightness    Pain Type  Acute pain    Pain Onset  More than a month ago    Pain Frequency  Intermittent    Aggravating Factors   when pressure on the coccyx    Pain Relieving Factors  heat pad    Multiple Pain Sites  No         [facility] PT Assessment -  05/15/20 0001      Palpation   SI assessment   right ASIS is anteriorly rotated                    [facility] Adult PT Treatment/Exercise - 05/15/20 0001      Ambulation/Gait   Gait Comments  running on treadmill at 5 mph observing reduced trunk incline, increased  left foot pronation, good trunk rotation and needs to be more on her toes      Exercises   Exercises  Other Exercises    Other Exercises   ankle inversion with red band to work on foot pronation      Manual Therapy   Manual Therapy  Joint mobilization;Muscle Energy Technique;Soft tissue mobilization    Joint Mobilization  PA and rotation to L1-L5, Sacral mobilizaiton to correct left rotation    Soft tissue mobilization  right quadratus, right coccygeus and mediao ischial tuberosity    Myofascial Release  release to the right lateral trunk in ITB band streatch    Muscle  Energy Technique  to correct right and lleft ilium             PT Education - 05/15/20 0843    Education Details  Access Code: [email or web address]    Person(s) Educated  Patient    Methods  Explanation;Demonstration;Handout    Comprehension  Verbalized understanding;Returned demonstration       PT Short Term Goals - 01/31/20 1621      PT SHORT TERM GOAL #1   Title  independent with initial HEP    Time  4    Period  Weeks    Status  Achieved      PT SHORT TERM GOAL #2   Title  pelvis stays in correct alignment for 4 weeks due to improved stability    Time  4    Period  Weeks    Status  Achieved    Target Date  12/06/19      PT SHORT TERM GOAL #3   Title  understand correct toileting techniques to improve constipation and ways to manage consitpation    Time  4    Period  Weeks    Status  Achieved      PT SHORT TERM GOAL #4   Title  pain with movement in bed decreased >/= 25%    Time  4    Period  Weeks    Status  Achieved      PT SHORT TERM GOAL #5   Title  coccyx pain with sitting and bowel movements decreased >/= 25%    Time  4     Period  Weeks    Status  Achieved        PT Long Term Goals - 05/15/20 0846      PT LONG TERM GOAL #1   Title  independent with advanced HEP    Time  8    Period  Weeks    Status  On-going      PT LONG TERM GOAL #2   Title  coccyx pain decreased >/= 75% with sitting    Baseline  was better this week    Time  8    Period  Weeks    Status  On-going      PT LONG TERM GOAL #3   Title  diastasis recti decreases >/= 2 fingers width with good fascial tension    Time  4    Period  Weeks    Status  Achieved      PT LONG TERM GOAL #4   Title  sitting and standing with pain decreased >/= 75% due to improve stability of SI joint and lumbar area    Baseline  standing for the coccyx and SI  decreased by 90% better; increased pain with back    Time  8    Period  Weeks    Status  On-going      PT LONG TERM GOAL #5   Title  feeling of vagina area feeling heavy and swollen decreased >/= 75% with standing to shop or house work due to improved pelvic floor strength    Baseline  75% better    Time  8    Period  Weeks    Status  On-going            Plan - 05/15/20 0809    Clinical Impression Statement  Patient is now running. Patient is having pain in the pubic bone. Patient runs with increased in left foot pronation, reduced  trunk incline and too much heel strike. After therapy patient pelvis was in correct alignment. Patient has a tight right coccygeus. Patient will benefit from skilled therapy to improve strength, stabilization fo the SI joint and reducing coccyx pain.    Personal Factors and Comorbidities  Comorbidity 1;Comorbidity 2    Comorbidities  IBS, diastasis Recti    Examination-Activity Limitations  Caring for Others;Stand;Toileting;Locomotion Level;Transfers;Bend;Sit    Examination-Participation Restrictions  Community Activity;Interpersonal Relationship    Stability/Clinical Decision Making  Evolving/Moderate complexity    Rehab Potential  Excellent    PT  Frequency  2x / week    PT Duration  8 weeks    PT Treatment/Interventions  ADLs/Self Care Home Management;Biofeedback;Cryotherapy;Electrical Stimulation;Iontophoresis 4mg /ml Dexamethasone;Moist Heat;Ultrasound;Balance training;Therapeutic exercise;Therapeutic activities;Neuromuscular re-education;Patient/family education;Dry needling;Manual techniques;Taping;Spinal Manipulations    PT Next Visit Plan  write renewal, work on running, work on advanced SI stability exercises, pelvic floor contraction with running as she coughs    PT Home Exercise Plan  Access Code: [email or web address]    Consulted and Agree with Plan of Care  Patient       Patient will benefit from skilled therapeutic intervention in order to improve the following deficits and impairments:  Decreased coordination, Decreased range of motion, Increased fascial restricitons, Decreased endurance, Increased muscle spasms, Pain, Impaired flexibility, Decreased strength, Decreased mobility  Visit Diagnosis: Coccyx pain  Acute bilateral low back pain without sciatica  Cramp and spasm  Muscle weakness (generalized)     Problem List Patient Active Problem List   Diagnosis Date Noted  . Physical exam 04/24/2020  . Pregnant 09/15/2019    09/17/2019, PT 05/15/20 8:47 AM   [facility] [address] [place], [place], [place] Phone: [phone]   Fax:  [phone]  Name: Donisha Hoch MRN: Lysle Morales Date of Birth: Apr 25, 1986

## 2020-05-15 NOTE — Patient Instructions (Signed)
Access Code: [email or web address] URL: [email or web address] Date: 04/13/2020 Prepared by: Eulis Foster  Exercises Bird Dog - 1 x daily - 7 x weekly - 10 reps - 1 sets Half Kneeling Diagonal Lift with Medicine Ball - 1 x daily - 7 x weekly - 10 reps - 1 sets Dead Bug - 1 x daily - 7 x weekly - 10 reps - 1 sets Supine Pelvic Floor Contraction - 2 x daily - 7 x weekly - 1 sets - 5 reps - 5 sec hold Seated Pelvic Floor Contraction - 3 x daily - 7 x weekly - 1 sets - 5 reps - 5 sec hold Prone Hip Extension - Two Pillows - 1 x daily - 3 x weekly - 1 sets - 10 reps - 1 sec hold Prone Shoulder Horizontal Abduction on Swiss Ball - 1 x daily - 3 x weekly - 3 sets - 10 reps Squat with TRX&Acirc;&reg; - 1 x daily - 7 x weekly - 1 sets - 10 reps Thoracic Mobilization on Foam Roll - Hands Clasped - 1 x daily - 7 x weekly - 10 reps - 3 sets Thoracic Mobilization with Foam Roll - 1 x daily - 7 x weekly - 10 reps - 3 sets Seated Ankle Inversion with Resistance - 1 x daily - 7 x weekly - 2 sets - 10 reps [facility] [address] [place], [place] [address] Phone # [phone] Fax [phone]

## 2020-06-09 ENCOUNTER — Other Ambulatory Visit: Payer: Self-pay

## 2020-06-09 ENCOUNTER — Ambulatory Visit: Payer: BC Managed Care – PPO | Attending: Obstetrics and Gynecology | Admitting: Physical Therapy

## 2020-06-09 ENCOUNTER — Encounter: Payer: Self-pay | Admitting: Physical Therapy

## 2020-06-09 DIAGNOSIS — R252 Cramp and spasm: Secondary | ICD-10-CM | POA: Diagnosis not present

## 2020-06-09 DIAGNOSIS — M545 Low back pain, unspecified: Secondary | ICD-10-CM

## 2020-06-09 DIAGNOSIS — M6281 Muscle weakness (generalized): Secondary | ICD-10-CM

## 2020-06-09 DIAGNOSIS — M533 Sacrococcygeal disorders, not elsewhere classified: Secondary | ICD-10-CM | POA: Diagnosis not present

## 2020-06-09 NOTE — Therapy (Signed)
[facility] [address], [address] [place], [place], [address] Phone: [phone]   Fax:  [phone]  Physical Therapy Treatment  Patient Details  Name: Lauren Small MRN: [id number] Date of Birth: 10/23/1986 Referring Provider (PT): Dr. Belva Agee   Encounter Date: 06/09/2020   PT End of Session - 06/09/20 0849    Visit Number 22    Date for PT Re-Evaluation 09/01/20    Authorization Type bcbs    PT Start Time 0800    PT Stop Time 0845    PT Time Calculation (min) 45 min    Activity Tolerance Patient tolerated treatment well;No increased pain    Behavior During Therapy WFL for tasks assessed/performed           Past Medical History:  Diagnosis Date  . Allergy   . Asthma   . Blood in stool   . GERD (gastroesophageal reflux disease)   . History of chicken pox     Past Surgical History:  Procedure Laterality Date  . urethra polyp    . WISDOM TOOTH EXTRACTION      There were no vitals filed for this visit.   Subjective Assessment - 06/09/20 0806    Subjective My right side is a mess. I have the pain in my right sit bone and into the right hamstring and today wrapped to the groin.    Patient Stated Goals reduce the tailbone pain, sacral pain    Pain Score 5     Pain Location Coccyx   right ischial tuberosity   Pain Orientation Right    Pain Descriptors / Indicators Tightness    Pain Type Acute pain    Pain Onset More than a month ago    Pain Frequency Intermittent    Aggravating Factors  running    Pain Relieving Factors rest    Multiple Pain Sites No              [facility] PT Assessment - 06/09/20 0001      Assessment   Medical Diagnosis M54.5 Low back pain; sacral pain    Referring Provider (PT) Dr. Belva Agee    Onset Date/Surgical Date 09/15/19    Prior Therapy yes      Precautions   Precautions None      Restrictions   Weight Bearing Restrictions No      Posture/Postural Control    Posture/Postural Control No significant limitations      AROM   Lumbar Extension decreased by 25%    Lumbar - Right Side Bend decreased by 25% with feeling of stickiness in the left lumbar    Lumbar - Right Rotation decreased by 25%    Lumbar - Left Rotation full      Strength   Right Hip Flexion 4+/5    Right Hip Extension 5/5    Right Hip External Rotation  4/5    Right Hip ABduction 4/5    Right Hip ADduction 4+/5    Left Hip External Rotation 4/5      Palpation   SI assessment  right ASIS is anteriorly rotated; sacrum rotated left                      Pelvic Floor Special Questions - 06/09/20 0001    Diastasis Recti 2 fingers width above and below the umbilicus; difficulty with not feeling the core engage             [facility] Adult  PT Treatment/Exercise - 06/09/20 0001      Lumbar Exercises: Aerobic   Elliptical level 3 for 5 min. while assessing patient      Lumbar Exercises: Sidelying   Other Sidelying Lumbar Exercises sidely plank with contracting the obliques    Other Sidelying Lumbar Exercises sidely while pressing feet into the therapist and lifting the knees up with core contraction      Lumbar Exercises: Prone   Other Prone Lumbar Exercises modified plank with engaging the core and reducing the diastasis      Manual Therapy   Manual Therapy Soft tissue mobilization;Joint mobilization;Taping    Manual therapy comments while assessing trigger points for dry needling    Joint Mobilization PA and rotation to L1-L5, Sacral mobilizaiton to correct left rotation    Soft tissue mobilization elongation of the right quadratus, right hip adductor and right gluteal to elongate muscles after dry needling    Kinesiotex Facilitate Muscle      Kinesiotix   Facilitate Muscle  the abdominals to reduce the diastasis recti with criss cross and across the umbilicus            Trigger Point Dry Needling - 06/09/20 0001    Consent Given? Yes    Education Handout  Provided Previously provided    Muscles Treated Lower Quadrant Adductor longus/brevis/magnus    Muscles Treated Back/Hip Quadratus lumborum    Adductor Response Twitch response elicited;Palpable increased muscle length   right   Quadratus Lumborum Response Twitch response elicited;Palpable increased muscle length   right               PT Education - 06/09/20 1203    Education Details side plank, modified plank, sidely core engagement, how to tape her abdominals    Person(s) Educated Patient    Methods Explanation;Demonstration;Verbal cues;Handout    Comprehension Returned demonstration;Verbalized understanding            PT Short Term Goals - 01/31/20 1621      PT SHORT TERM GOAL #1   Title independent with initial HEP    Time 4    Period Weeks    Status Achieved      PT SHORT TERM GOAL #2   Title pelvis stays in correct alignment for 4 weeks due to improved stability    Time 4    Period Weeks    Status Achieved    Target Date 12/06/19      PT SHORT TERM GOAL #3   Title understand correct toileting techniques to improve constipation and ways to manage consitpation    Time 4    Period Weeks    Status Achieved      PT SHORT TERM GOAL #4   Title pain with movement in bed decreased >/= 25%    Time 4    Period Weeks    Status Achieved      PT SHORT TERM GOAL #5   Title coccyx pain with sitting and bowel movements decreased >/= 25%    Time 4    Period Weeks    Status Achieved             PT Long Term Goals - 06/09/20 1204      PT LONG TERM GOAL #1   Title independent with advanced HEP    Time 8    Period Weeks    Status On-going      PT LONG TERM GOAL #2   Title coccyx pain decreased >/= 75%  with sitting    Baseline was better this week    Time 8    Period Weeks    Status On-going      PT LONG TERM GOAL #3   Title diastasis recti decreases >/= 2 fingers width with good fascial tension    Period Weeks    Status Achieved      PT LONG TERM GOAL  #4   Title sitting and standing with pain decreased >/= 75% due to improve stability of SI joint and lumbar area    Baseline standing for the coccyx and SI  decreased by 90% better; increased pain with back    Time 8    Period Weeks    Status On-going      PT LONG TERM GOAL #5   Title feeling of vagina area feeling heavy and swollen decreased >/= 75% with standing to shop or house work due to improved pelvic floor strength    Baseline 75% better    Time 8    Period Weeks    Status On-going                 Plan - 06/09/20 1205    Clinical Impression Statement Patient reports no urinary leakage but only when she coughs while running. Diastasis recti is 2 finger width with good fascial tension. Patient is learning next level of core exercises for stabilization. Patient needed kinesiotape to help facilitate the abdominals to reduce the diastisis recti. Today patient right ilium was rotated anteriorly so she continues to need more stabilitation.    Personal Factors and Comorbidities Comorbidity 1;Comorbidity 2    Comorbidities IBS, diastasis Recti    Examination-Activity Limitations Caring for Others;Stand;Toileting;Locomotion Level;Transfers;Bend;Sit    Examination-Participation Restrictions Community Activity;Interpersonal Relationship    Stability/Clinical Decision Making Evolving/Moderate complexity    Rehab Potential Excellent    PT Frequency 2x / week    PT Duration 8 weeks    PT Treatment/Interventions ADLs/Self Care Home Management;Biofeedback;Cryotherapy;Electrical Stimulation;Iontophoresis 4mg /ml Dexamethasone;Moist Heat;Ultrasound;Balance training;Therapeutic exercise;Therapeutic activities;Neuromuscular re-education;Patient/family education;Dry needling;Manual techniques;Taping;Spinal Manipulations    PT Next Visit Plan advance core exercises, see how the diastasis is, check pelvic alignment    PT Home Exercise Plan Access Code: [email or web address]    Consulted and Agree with Plan of  Care Patient           Patient will benefit from skilled therapeutic intervention in order to improve the following deficits and impairments:  Decreased coordination, Decreased range of motion, Increased fascial restricitons, Decreased endurance, Increased muscle spasms, Pain, Impaired flexibility, Decreased strength, Decreased mobility  Visit Diagnosis: Coccyx pain - Plan: PT plan of care cert/re-cert  Acute bilateral low back pain without sciatica - Plan: PT plan of care cert/re-cert  Cramp and spasm - Plan: PT plan of care cert/re-cert  Muscle weakness (generalized) - Plan: PT plan of care cert/re-cert     Problem List Patient Active Problem List   Diagnosis Date Noted  . Physical exam 04/24/2020  . Pregnant 09/15/2019    09/17/2019, PT 06/09/20 12:10 PM   [facility] [address], [address] [place], [place], [place] Phone: [phone]   Fax:  [phone]  Name: Lauren Small MRN: Lysle Morales Date of Birth: 1986/06/24

## 2020-06-29 ENCOUNTER — Encounter: Payer: Self-pay | Admitting: Physical Therapy

## 2020-06-29 ENCOUNTER — Other Ambulatory Visit: Payer: Self-pay

## 2020-06-29 ENCOUNTER — Ambulatory Visit: Payer: BC Managed Care – PPO | Attending: Obstetrics and Gynecology | Admitting: Physical Therapy

## 2020-06-29 DIAGNOSIS — R252 Cramp and spasm: Secondary | ICD-10-CM

## 2020-06-29 DIAGNOSIS — M545 Low back pain, unspecified: Secondary | ICD-10-CM

## 2020-06-29 DIAGNOSIS — M6281 Muscle weakness (generalized): Secondary | ICD-10-CM | POA: Diagnosis not present

## 2020-06-29 DIAGNOSIS — M533 Sacrococcygeal disorders, not elsewhere classified: Secondary | ICD-10-CM | POA: Diagnosis not present

## 2020-06-29 NOTE — Therapy (Signed)
[facility] [address], [address] [place], [place], [address] Phone: [phone]   Fax:  [phone]  Physical Therapy Treatment  Patient Details  Name: Lauren Small MRN: [id number] Date of Birth: 1986-03-07 Referring Provider (PT): Dr. Belva Agee   Encounter Date: 06/29/2020   PT End of Session - 06/29/20 1622    Visit Number 23    Date for PT Re-Evaluation 09/01/20    Authorization Type bcbs    PT Start Time 1615    PT Stop Time 1700    PT Time Calculation (min) 45 min    Activity Tolerance Patient tolerated treatment well;No increased pain    Behavior During Therapy WFL for tasks assessed/performed           Past Medical History:  Diagnosis Date  . Allergy   . Asthma   . Blood in stool   . GERD (gastroesophageal reflux disease)   . History of chicken pox     Past Surgical History:  Procedure Laterality Date  . urethra polyp    . WISDOM TOOTH EXTRACTION      There were no vitals filed for this visit.   Subjective Assessment - 06/29/20 1618    Subjective I feel like I am twisted again. The hip flexors are tight. I have trouble feeling my core engaging.    Patient Stated Goals reduce the tailbone pain, sacral pain    Currently in Pain? Yes    Pain Score 8     Pain Location Hip    Pain Orientation Right    Pain Descriptors / Indicators Tightness    Pain Type Acute pain    Pain Onset More than a month ago    Pain Frequency Intermittent    Aggravating Factors  not sure    Pain Relieving Factors sitting on a lacrosse ball to massage it    Pain Score 5    Pain Location Back    Pain Orientation Mid;Upper    Pain Descriptors / Indicators Tightness    Pain Type Acute pain    Pain Onset More than a month ago    Pain Frequency Intermittent    Aggravating Factors  holding her child, stanidng on her feet all day    Pain Relieving Factors massage gun              [facility] PT Assessment - 06/29/20 0001       Palpation   Palpation comment right ilium is anteriorly rotated      Pelvic Compression   Findings Positive    Side Right    comment pain in pubic and coccyx      Gaenslen's test   Findings Positive    Side  Right    Comments coccyx                         [facility] Adult PT Treatment/Exercise - 06/29/20 0001      Neuro Re-ed    Neuro Re-ed Details  supine breathing into the lower rib cage and manual assist the rib cage to move downward, tactile cues to open up the lower rib cage and opening the back      Lumbar Exercises: Standing   Other Standing Lumbar Exercises tall kneel and half kneel with PNF pattern with engaging the obliques      Lumbar Exercises: Supine   Bent Knee Raise 10 reps    Bent Knee Raise Limitations doing with  keep the core engaged; then with pressing the the ball into the keep and move the other leg      Manual Therapy   Manual Therapy Joint mobilization;Soft tissue mobilization    Joint Mobilization PA and rotation to L1-L5, Sacral mobilizaiton to correct left rotation; righ trib mobiliztion to open u pthe lower rib cage    Soft tissue mobilization right hamstring, right coccygeus, right levator ani; right quadratus    Muscle Energy Technique to correct right and lleft ilium                  PT Education - 06/29/20 1700    Education Details how to exercise with her present routine to engage her abdominal correctly    Person(s) Educated Patient    Methods Explanation;Demonstration    Comprehension Verbalized understanding;Returned demonstration            PT Short Term Goals - 01/31/20 1621      PT SHORT TERM GOAL #1   Title independent with initial HEP    Time 4    Period Weeks    Status Achieved      PT SHORT TERM GOAL #2   Title pelvis stays in correct alignment for 4 weeks due to improved stability    Time 4    Period Weeks    Status Achieved    Target Date 12/06/19      PT SHORT TERM GOAL #3   Title  understand correct toileting techniques to improve constipation and ways to manage consitpation    Time 4    Period Weeks    Status Achieved      PT SHORT TERM GOAL #4   Title pain with movement in bed decreased >/= 25%    Time 4    Period Weeks    Status Achieved      PT SHORT TERM GOAL #5   Title coccyx pain with sitting and bowel movements decreased >/= 25%    Time 4    Period Weeks    Status Achieved             PT Long Term Goals - 06/29/20 1713      PT LONG TERM GOAL #1   Title independent with advanced HEP    Time 8    Period Weeks    Status On-going      PT LONG TERM GOAL #2   Title coccyx pain decreased >/= 75% with sitting    Time 8    Period Weeks    Status On-going      PT LONG TERM GOAL #3   Title diastasis recti decreases >/= 2 fingers width with good fascial tension    Time 4    Period Weeks    Status Achieved      PT LONG TERM GOAL #4   Title sitting and standing with pain decreased >/= 75% due to improve stability of SI joint and lumbar area    Baseline standing for the coccyx and SI  decreased by 90% better; increased pain with back    Time 8    Period Weeks    Status On-going      PT LONG TERM GOAL #5   Title feeling of vagina area feeling heavy and swollen decreased >/= 75% with standing to shop or house work due to improved pelvic floor strength    Time 8    Period Weeks    Status Achieved  Plan - 06/29/20 1707    Clinical Impression Statement After therapy, patient pelvis was in correct alignment, able to open up the rib cage and increased movement of the thoracic area. Patient was able to engage the lower abdominals and keep the pelvis stable while doing heel slides for the first time. Patient is running now. Patient is able to wait 3 weeks till her next visit before her pelvis not in correct alignment. Patient will benefit from stabilization exercise and placing the pelvis in correct aliginment.    Personal  Factors and Comorbidities Comorbidity 1;Comorbidity 2    Comorbidities IBS, diastasis Recti    Examination-Activity Limitations Caring for Others;Stand;Toileting;Locomotion Level;Transfers;Bend;Sit    Stability/Clinical Decision Making Evolving/Moderate complexity    Rehab Potential Excellent    PT Frequency 2x / week    PT Duration 8 weeks    PT Treatment/Interventions ADLs/Self Care Home Management;Biofeedback;Cryotherapy;Electrical Stimulation;Iontophoresis 4mg /ml Dexamethasone;Moist Heat;Ultrasound;Balance training;Therapeutic exercise;Therapeutic activities;Neuromuscular re-education;Patient/family education;Dry needling;Manual techniques;Taping;Spinal Manipulations    PT Next Visit Plan work on standing core strength, check pelvis alignment, work on engagement of the upper abdominals    PT Home Exercise Plan Access Code: [email or web address]    Consulted and Agree with Plan of Care Patient           Patient will benefit from skilled therapeutic intervention in order to improve the following deficits and impairments:  Decreased coordination, Decreased range of motion, Increased fascial restricitons, Decreased endurance, Increased muscle spasms, Pain, Impaired flexibility, Decreased strength, Decreased mobility  Visit Diagnosis: Coccyx pain  Acute bilateral low back pain without sciatica  Cramp and spasm  Muscle weakness (generalized)     Problem List Patient Active Problem List   Diagnosis Date Noted  . Physical exam 04/24/2020  . Pregnant 09/15/2019    09/17/2019, PT 06/29/20 5:15 PM   [facility] [address], [address] [place], [place], [place] Phone: [phone]   Fax:  [phone]  Name: Oluwademilade Kellett MRN: Lysle Morales Date of Birth: 08/02/1986

## 2020-07-17 ENCOUNTER — Ambulatory Visit: Payer: BC Managed Care – PPO | Admitting: Physical Therapy

## 2020-07-17 ENCOUNTER — Other Ambulatory Visit: Payer: Self-pay

## 2020-07-17 ENCOUNTER — Encounter: Payer: Self-pay | Admitting: Physical Therapy

## 2020-07-17 DIAGNOSIS — M533 Sacrococcygeal disorders, not elsewhere classified: Secondary | ICD-10-CM

## 2020-07-17 DIAGNOSIS — R252 Cramp and spasm: Secondary | ICD-10-CM

## 2020-07-17 DIAGNOSIS — M545 Low back pain, unspecified: Secondary | ICD-10-CM

## 2020-07-17 DIAGNOSIS — M6281 Muscle weakness (generalized): Secondary | ICD-10-CM | POA: Diagnosis not present

## 2020-07-17 NOTE — Patient Instructions (Signed)
Access Code: [email or web address] URL: [email or web address] Date: 07/17/2020 Prepared by: Eulis Foster  Exercises Bird Dog - 1 x daily - 7 x weekly - 10 reps - 1 sets Half Kneeling Diagonal Lift with Medicine Ball - 1 x daily - 7 x weekly - 10 reps - 1 sets Dead Bug - 1 x daily - 7 x weekly - 10 reps - 1 sets Supine Pelvic Floor Contraction - 2 x daily - 7 x weekly - 1 sets - 5 reps - 5 sec hold Seated Pelvic Floor Contraction - 3 x daily - 7 x weekly - 1 sets - 5 reps - 5 sec hold Prone Hip Extension - Two Pillows - 1 x daily - 3 x weekly - 1 sets - 10 reps - 1 sec hold Prone Shoulder Horizontal Abduction on Swiss Ball - 1 x daily - 3 x weekly - 3 sets - 10 reps Squat with TRX&Acirc;&reg; - 1 x daily - 7 x weekly - 1 sets - 10 reps Thoracic Mobilization on Foam Roll - Hands Clasped - 1 x daily - 7 x weekly - 10 reps - 3 sets Thoracic Mobilization with Foam Roll - 1 x daily - 7 x weekly - 10 reps - 3 sets Seated Ankle Inversion with Resistance - 1 x daily - 7 x weekly - 2 sets - 10 reps Sidelying Reverse Clamshell - 1 x daily - 7 x weekly - 1 sets - 10 reps Walking Lunge with Arms Overhead - 1 x daily - 7 x weekly - 1 sets - 10 reps Alternating Single Leg Bridge - 1 x daily - 7 x weekly - 1 sets - 10 reps [facility] [address], [address] [place], [place] [address] Phone # [phone] Fax [phone]

## 2020-07-17 NOTE — Therapy (Signed)
[facility] [address] [place], [place], [address] Phone: [phone]   Fax:  [phone]  Physical Therapy Treatment  Patient Details  Name: Lauren Small MRN: [id number] Date of Birth: Apr 04, 1986 Referring Provider (PT): Dr. Belva Agee   Encounter Date: 07/17/2020   PT End of Session - 07/17/20 0808    Visit Number 24    Date for PT Re-Evaluation 09/01/20    Authorization Type bcbs    PT Start Time 0800    PT Stop Time 0840    PT Time Calculation (min) 40 min    Activity Tolerance Patient tolerated treatment well;No increased pain    Behavior During Therapy WFL for tasks assessed/performed           Past Medical History:  Diagnosis Date  . Allergy   . Asthma   . Blood in stool   . GERD (gastroesophageal reflux disease)   . History of chicken pox     Past Surgical History:  Procedure Laterality Date  . urethra polyp    . WISDOM TOOTH EXTRACTION      There were no vitals filed for this visit.   Subjective Assessment - 07/17/20 0805    Subjective No worse, no better.  After several weeks the whole right side gets worse.    Patient Stated Goals reduce the tailbone pain, sacral pain    Currently in Pain? Yes    Pain Score 6     Pain Location Hip    Pain Orientation Right    Pain Descriptors / Indicators Tightness    Pain Type Acute pain    Pain Radiating Towards goes up right back and wrap around the right thigh    Pain Onset More than a month ago    Pain Frequency Intermittent    Aggravating Factors  running    Pain Relieving Factors get off feet    Multiple Pain Sites No              [facility] PT Assessment - 07/17/20 0001      Palpation   Palpation comment right ilium is anteriorly rotated                         [facility] Adult PT Treatment/Exercise - 07/17/20 0001      Lumbar Exercises: Standing   Forward Lunge 10 reps    Forward Lunge Limitations each side with  holding 3# wt and move overhead, tactile cues to keep trunk upright and not drop the hip      Lumbar Exercises: Supine   Heel Slides 10 reps    Heel Slides Limitations with forcus on stabilizing pelvis    Single Leg Bridge 10 reps;1 second   right, left   Bridge with Ball Squeeze Limitations pushing side of bridge arm upward with 3#      Lumbar Exercises: Sidelying   Other Sidelying Lumbar Exercises sidely right hip IR      Manual Therapy   Manual Therapy Joint mobilization;Soft tissue mobilization;Muscle Energy Technique    Manual therapy comments manual to assess for dry neelding    Joint Mobilization side glide on left to right on lumbar    Soft tissue mobilization righ tlumbar paraspinals, quadratus, and gluteal to elongate after dry needling    Muscle Energy Technique to correct right and lleft ilium            Trigger Point Dry Needling - 07/17/20 0001  Consent Given? Yes    Education Handout Provided Previously provided    Muscles Treated Head and Neck --    Muscles Treated Back/Hip Lumbar multifidi;Gluteus medius;Quadratus lumborum   right   Gluteus Medius Response Twitch response elicited;Palpable increased muscle length    Lumbar multifidi Response Twitch response elicited;Palpable increased muscle length    Quadratus Lumborum Response Palpable increased muscle length;Twitch response elicited                PT Education - 07/17/20 0846    Education Details Access Code: [email or web address]    Person(s) Educated Patient    Methods Explanation;Demonstration;Verbal cues    Comprehension Verbalized understanding;Returned demonstration            PT Short Term Goals - 01/31/20 1621      PT SHORT TERM GOAL #1   Title independent with initial HEP    Time 4    Period Weeks    Status Achieved      PT SHORT TERM GOAL #2   Title pelvis stays in correct alignment for 4 weeks due to improved stability    Time 4    Period Weeks    Status Achieved    Target Date  12/06/19      PT SHORT TERM GOAL #3   Title understand correct toileting techniques to improve constipation and ways to manage consitpation    Time 4    Period Weeks    Status Achieved      PT SHORT TERM GOAL #4   Title pain with movement in bed decreased >/= 25%    Time 4    Period Weeks    Status Achieved      PT SHORT TERM GOAL #5   Title coccyx pain with sitting and bowel movements decreased >/= 25%    Time 4    Period Weeks    Status Achieved             PT Long Term Goals - 07/17/20 0848      PT LONG TERM GOAL #1   Title independent with advanced HEP    Time 8    Period Weeks    Status On-going      PT LONG TERM GOAL #2   Title coccyx pain decreased >/= 75% with sitting    Baseline was better this week    Time 8    Period Weeks    Status On-going      PT LONG TERM GOAL #3   Title diastasis recti decreases >/= 2 fingers width with good fascial tension    Time 4    Period Weeks    Status Achieved      PT LONG TERM GOAL #4   Title sitting and standing with pain decreased >/= 75% due to improve stability of SI joint and lumbar area    Baseline standing for the coccyx and SI  decreased by 90% better; increased pain with back    Time 8    Period Weeks    Status On-going      PT LONG TERM GOAL #5   Title feeling of vagina area feeling heavy and swollen decreased >/= 75% with standing to shop or house work due to improved pelvic floor strength    Baseline 75% better    Time 8    Period Weeks    Status Achieved                 Plan -  07/17/20 0809    Clinical Impression Statement Patient needed tactile cues to engage the right lower abdominals to stabilize the right SI joint. Patient pelvis was in correct alignment after manual work. Her diastasis is 2 fingers width with good tension. She is learning right sided exercises to challenge the right core. Patient pain comes on when she is running. After several weeks her pelvis goes out of alignment.  Patient will benefit from skilled therapy to stabilize the right SI joint.    Personal Factors and Comorbidities Comorbidity 1;Comorbidity 2    Comorbidities IBS, diastasis Recti    Examination-Activity Limitations Caring for Others;Stand;Toileting;Locomotion Level;Transfers;Bend;Sit    Examination-Participation Restrictions Community Activity;Interpersonal Relationship    Stability/Clinical Decision Making Evolving/Moderate complexity    Rehab Potential Excellent    PT Frequency 2x / week    PT Duration 8 weeks    PT Treatment/Interventions ADLs/Self Care Home Management;Biofeedback;Cryotherapy;Electrical Stimulation;Iontophoresis 4mg /ml Dexamethasone;Moist Heat;Ultrasound;Balance training;Therapeutic exercise;Therapeutic activities;Neuromuscular re-education;Patient/family education;Dry needling;Manual techniques;Taping;Spinal Manipulations    PT Next Visit Plan work on standing core strength, check pelvis alignment, work on engagement of the upper abdominals    PT Home Exercise Plan Access Code: [email or web address]    Consulted and Agree with Plan of Care Patient           Patient will benefit from skilled therapeutic intervention in order to improve the following deficits and impairments:  Decreased coordination, Decreased range of motion, Increased fascial restricitons, Decreased endurance, Increased muscle spasms, Pain, Impaired flexibility, Decreased strength, Decreased mobility  Visit Diagnosis: Coccyx pain  Acute bilateral low back pain without sciatica  Cramp and spasm  Muscle weakness (generalized)     Problem List Patient Active Problem List   Diagnosis Date Noted  . Physical exam 04/24/2020  . Pregnant 09/15/2019    09/17/2019, PT 07/17/20 8:49 AM   [facility] [address] [place], [place], [place] Phone: [phone]   Fax:  [phone]  Name: Lauren Small MRN: Lysle Morales Date of Birth:  07-Oct-1986

## 2020-08-16 ENCOUNTER — Other Ambulatory Visit: Payer: Self-pay

## 2020-08-16 ENCOUNTER — Ambulatory Visit: Payer: BC Managed Care – PPO | Attending: Obstetrics and Gynecology | Admitting: Physical Therapy

## 2020-08-16 ENCOUNTER — Encounter: Payer: Self-pay | Admitting: Physical Therapy

## 2020-08-16 DIAGNOSIS — R252 Cramp and spasm: Secondary | ICD-10-CM

## 2020-08-16 DIAGNOSIS — M545 Low back pain, unspecified: Secondary | ICD-10-CM

## 2020-08-16 DIAGNOSIS — M533 Sacrococcygeal disorders, not elsewhere classified: Secondary | ICD-10-CM | POA: Diagnosis not present

## 2020-08-16 DIAGNOSIS — M6281 Muscle weakness (generalized): Secondary | ICD-10-CM | POA: Diagnosis not present

## 2020-08-16 NOTE — Therapy (Signed)
[facility] [address] [place], [place], [address] Phone: [phone]   Fax:  [phone]  Physical Therapy Treatment  Patient Details  Name: Lauren Small MRN: [id number] Date of Birth: 1986/06/21 Referring Provider (PT): Dr. Belva Agee   Encounter Date: 08/16/2020   PT End of Session - 08/16/20 0800    Visit Number 26    Date for PT Re-Evaluation 09/01/20    Authorization Type bcbs    PT Start Time 0800    PT Stop Time 0840    PT Time Calculation (min) 40 min    Activity Tolerance Patient tolerated treatment well;No increased pain    Behavior During Therapy WFL for tasks assessed/performed           Past Medical History:  Diagnosis Date  . Allergy   . Asthma   . Blood in stool   . GERD (gastroesophageal reflux disease)   . History of chicken pox     Past Surgical History:  Procedure Laterality Date  . urethra polyp    . WISDOM TOOTH EXTRACTION      There were no vitals filed for this visit.   Subjective Assessment - 08/16/20 0801    Subjective Right SI gets rotated and get the aches and pain back. The exercises are helping it get stronger. No tailbone pain in the last month. My ribs have been bothering me. I am working my back and core.    Patient Stated Goals reduce the tailbone pain, sacral pain    Currently in Pain? Yes    Pain Score 7     Pain Location Hip    Pain Orientation Right    Pain Descriptors / Indicators Burning;Tightness    Pain Type Acute pain    Pain Radiating Towards goes up right back and wrap around the right thigh    Pain Onset More than a month ago    Pain Frequency Intermittent    Aggravating Factors  running, exercise    Pain Relieving Factors get off feet    Multiple Pain Sites No              [facility] PT Assessment - 08/16/20 0001      Assessment   Medical Diagnosis M54.5 Low back pain; sacral pain    Referring Provider (PT) Dr. Belva Agee       Strength   Right Hip ADduction 4+/5      Palpation   SI assessment  ASIS are equal                         [facility] Adult PT Treatment/Exercise - 08/16/20 0001      Lumbar Exercises: Aerobic   Elliptical level 2 for 5 minutes while therapist assesses the patient      Lumbar Exercises: Standing   Other Standing Lumbar Exercises single leg squat with opposite knee being pressed into the wall to contract the gluteus medius      Lumbar Exercises: Seated   Other Seated Lumbar Exercises seated trunk rotation with knees crossed; seated thrunk lateral flexion with opening the rib cage      Lumbar Exercises: Supine   Single Leg Bridge 5 reps    Bridge with Ball Squeeze Limitations VC to not drop hip when doing it on the right leg      Modalities   Modalities Moist Heat      Moist Heat Therapy   Number Minutes Moist  Heat 15 Minutes    Moist Heat Location --   right hip adductors     Manual Therapy   Manual Therapy Soft tissue mobilization    Manual therapy comments manual to assess for dry neelding    Soft tissue mobilization using the addaday to the righ thamstring and hip adductor; circular massage to abdomen to promote peristalic motion of the intestines; release around the duprapubic area            Trigger Point Dry Needling - 08/16/20 0001    Consent Given? Yes    Education Handout Provided Previously provided    Muscles Treated Lower Quadrant Adductor longus/brevis/magnus;Hamstring   right   Adductor Response Palpable increased muscle length;Twitch response elicited    Hamstring Response Twitch response elicited;Palpable increased muscle length                  PT Short Term Goals - 01/31/20 1621      PT SHORT TERM GOAL #1   Title independent with initial HEP    Time 4    Period Weeks    Status Achieved      PT SHORT TERM GOAL #2   Title pelvis stays in correct alignment for 4 weeks due to improved stability    Time 4    Period Weeks     Status Achieved    Target Date 12/06/19      PT SHORT TERM GOAL #3   Title understand correct toileting techniques to improve constipation and ways to manage consitpation    Time 4    Period Weeks    Status Achieved      PT SHORT TERM GOAL #4   Title pain with movement in bed decreased >/= 25%    Time 4    Period Weeks    Status Achieved      PT SHORT TERM GOAL #5   Title coccyx pain with sitting and bowel movements decreased >/= 25%    Time 4    Period Weeks    Status Achieved             PT Long Term Goals - 08/16/20 1129      PT LONG TERM GOAL #1   Title independent with advanced HEP    Time 8    Period Weeks    Status On-going      PT LONG TERM GOAL #2   Title coccyx pain decreased >/= 75% with sitting    Time 8    Period Weeks    Status Achieved      PT LONG TERM GOAL #3   Title diastasis recti decreases >/= 2 fingers width with good fascial tension    Time 4    Period Weeks    Status Achieved      PT LONG TERM GOAL #4   Title sitting and standing with pain decreased >/= 75% due to improve stability of SI joint and lumbar area    Baseline standing for the coccyx and SI  decreased by 90% better; increased pain with back    Time 8    Period Weeks    Status On-going      PT LONG TERM GOAL #5   Title feeling of vagina area feeling heavy and swollen decreased >/= 75% with standing to shop or house work due to improved pelvic floor strength    Baseline 75% better    Time 8    Period Weeks  Status Achieved                 Plan - 08/16/20 0800    Clinical Impression Statement Patient has trigger points in the right hip adductors and hamstring pulling on the right SI joint. Patient has tightness in the thoracic lumbar area and educated on rotation to mobilize the area. Patient is able to enage her abdominal better. When she does a sinlge leg bridge on the right the left pelvis will drop. Paitent will leak urine when she coughs while running. Patient  pelvis in correct alignment. Patient will benefit from skilled therapy to stabilize the right SI joint.    Personal Factors and Comorbidities Comorbidity 1;Comorbidity 2    Comorbidities IBS, diastasis Recti    Examination-Activity Limitations Caring for Others;Stand;Toileting;Locomotion Level;Transfers;Bend;Sit    Examination-Participation Restrictions Community Activity;Interpersonal Relationship    Stability/Clinical Decision Making Evolving/Moderate complexity    Rehab Potential Excellent    PT Frequency 2x / week    PT Duration 8 weeks    PT Treatment/Interventions ADLs/Self Care Home Management;Biofeedback;Cryotherapy;Electrical Stimulation;Iontophoresis 4mg /ml Dexamethasone;Moist Heat;Ultrasound;Balance training;Therapeutic exercise;Therapeutic activities;Neuromuscular re-education;Patient/family education;Dry needling;Manual techniques;Taping;Spinal Manipulations    PT Next Visit Plan renewal if continue; internal pelvic floor work; review HEP and go over what is important; gluteus medius and hip adductor strength    PT Home Exercise Plan Access Code: [email or web address]    Consulted and Agree with Plan of Care Patient           Patient will benefit from skilled therapeutic intervention in order to improve the following deficits and impairments:  Decreased coordination, Decreased range of motion, Increased fascial restricitons, Decreased endurance, Increased muscle spasms, Pain, Impaired flexibility, Decreased strength, Decreased mobility  Visit Diagnosis: Coccyx pain  Acute bilateral low back pain without sciatica  Cramp and spasm  Muscle weakness (generalized)     Problem List Patient Active Problem List   Diagnosis Date Noted  . Physical exam 04/24/2020  . Pregnant 09/15/2019    09/17/2019, PT 08/16/20 11:30 AM    [facility] [address] [place], [place], [place] Phone: [phone]   Fax:   [phone]  Name: Marieme Mcmackin MRN: Lysle Morales Date of Birth: 03-18-1986

## 2020-09-14 ENCOUNTER — Other Ambulatory Visit: Payer: Self-pay

## 2020-09-14 ENCOUNTER — Encounter (HOSPITAL_COMMUNITY): Payer: Self-pay | Admitting: Emergency Medicine

## 2020-09-14 ENCOUNTER — Emergency Department (HOSPITAL_COMMUNITY)
Admission: EM | Admit: 2020-09-14 | Discharge: 2020-09-15 | Disposition: A | Discharge status: Home / Self Care | Payer: BC Managed Care – PPO | Attending: Emergency Medicine | Admitting: Emergency Medicine

## 2020-09-14 ENCOUNTER — Emergency Department (HOSPITAL_COMMUNITY): Payer: BC Managed Care – PPO

## 2020-09-14 DIAGNOSIS — R531 Weakness: Secondary | ICD-10-CM | POA: Diagnosis not present

## 2020-09-14 DIAGNOSIS — R250 Abnormal head movements: Secondary | ICD-10-CM | POA: Diagnosis not present

## 2020-09-14 DIAGNOSIS — R5383 Other fatigue: Secondary | ICD-10-CM | POA: Insufficient documentation

## 2020-09-14 DIAGNOSIS — R202 Paresthesia of skin: Secondary | ICD-10-CM | POA: Insufficient documentation

## 2020-09-14 DIAGNOSIS — S299XXA Unspecified injury of thorax, initial encounter: Secondary | ICD-10-CM | POA: Diagnosis not present

## 2020-09-14 DIAGNOSIS — Z5321 Procedure and treatment not carried out due to patient leaving prior to being seen by health care provider: Secondary | ICD-10-CM | POA: Diagnosis not present

## 2020-09-14 DIAGNOSIS — I10 Essential (primary) hypertension: Secondary | ICD-10-CM | POA: Diagnosis not present

## 2020-09-14 DIAGNOSIS — X58XXXA Exposure to other specified factors, initial encounter: Secondary | ICD-10-CM | POA: Diagnosis not present

## 2020-09-14 DIAGNOSIS — R110 Nausea: Secondary | ICD-10-CM | POA: Diagnosis not present

## 2020-09-14 LAB — CBC
HCT: 40.5 % (ref 36.0–46.0)
Hemoglobin: 13.2 g/dL (ref 12.0–15.0)
MCH: 29.9 pg (ref 26.0–34.0)
MCHC: 32.6 g/dL (ref 30.0–36.0)
MCV: 91.8 fL (ref 80.0–100.0)
Platelets: 296 10*3/uL (ref 150–400)
RBC: 4.41 MIL/uL (ref 3.87–5.11)
RDW: 12 % (ref 11.5–15.5)
WBC: 6.4 10*3/uL (ref 4.0–10.5)
nRBC: 0 % (ref 0.0–0.2)

## 2020-09-14 LAB — BASIC METABOLIC PANEL
Anion gap: 10 (ref 5–15)
BUN: 16 mg/dL (ref 6–20)
CO2: 24 mmol/L (ref 22–32)
Calcium: 9.5 mg/dL (ref 8.9–10.3)
Chloride: 104 mmol/L (ref 98–111)
Creatinine, Ser: 0.76 mg/dL (ref 0.44–1.00)
GFR calc Af Amer: >60 mL/min (ref >60)
GFR calc non Af Amer: >60 mL/min (ref >60)
Glucose, Bld: 102 mg/dL — ABNORMAL HIGH (ref 70–99)
Potassium: 3.8 mmol/L (ref 3.5–5.1)
Sodium: 138 mmol/L (ref 135–145)

## 2020-09-14 LAB — TROPONIN I (HIGH SENSITIVITY): Troponin I (High Sensitivity): <2 ng/L (ref <18)

## 2020-09-14 NOTE — ED Triage Notes (Signed)
Pt reports increased fatigue and weakness for the past three weeks accompanied short episodes of left sided chest pressure, dizziness, head rush and left arm tingling.  Pt noticed today as she was driving home from playing tennis tonight.

## 2020-09-15 ENCOUNTER — Telehealth (INDEPENDENT_AMBULATORY_CARE_PROVIDER_SITE_OTHER): Payer: BC Managed Care – PPO | Admitting: Family Medicine

## 2020-09-15 ENCOUNTER — Encounter: Payer: Self-pay | Admitting: Family Medicine

## 2020-09-15 VITALS — Ht 66.5 in | Wt 163.0 lb

## 2020-09-15 DIAGNOSIS — R42 Dizziness and giddiness: Secondary | ICD-10-CM

## 2020-09-15 DIAGNOSIS — R0789 Other chest pain: Secondary | ICD-10-CM

## 2020-09-15 DIAGNOSIS — R5383 Other fatigue: Secondary | ICD-10-CM | POA: Diagnosis not present

## 2020-09-15 DIAGNOSIS — R000 Tachycardia, unspecified: Secondary | ICD-10-CM

## 2020-09-15 LAB — POC URINE PREG, ED: Preg Test, Ur: NEGATIVE

## 2020-09-15 NOTE — ED Notes (Signed)
Pt left AMA. Advised to return if symptoms worsen. 

## 2020-09-15 NOTE — Progress Notes (Signed)
Patient: Lauren Small MRN: [id number] DOB: 1986/11/10 PCP: Sheliah Hatch, MD     I connected with Lysle Morales on 09/15/20 at @CHLAPPTIME @ by a video enabled telemedicine application and verified that I am speaking with the correct person using two identifiers.  Location patient: Home Location provider: [facility], Office Persons participating in this virtual visit: pt, Casanova Schurman C (CMA), , MD  I discussed the limitations of evaluation and management by telemedicine and the availability of in person appointments. The patient expressed understanding and agreed to proceed.   Subjective:  No chief complaint on file.   HPI: The patient is a 34 y.o. female who presents today for ED follow up. She was seen in the ED 09/14/2020 for fatigue, rapid heart rate, SOB, lightheadedness, tingling arm and tightness in chest. She complains only of nausea and fatigue today. She would like to discuss further testing.   Pt reports 'feeling off' since last week.  Felt 'jittery', light headed, rapid heartbeat.  Last week felt 'really fatigued' but started menstrual cycle on Monday.  Was advised by nurse triage to go to ER based on palpitations.  Rather than ER, went to tennis where she developed L arm numbness/cold.  Then went to ER.  Based on the wait times, she left before being seen.  Pt reports she has felt similarly if blood sugar was low but even w/ eating a snack, HR and nausea improved only for 30 minutes.  No unexplained weight loss.  Occasional 'chills crawling on my skin and my scalp off and on'.  Nausea is described as low level constant nausea.  Had diarrhea on Monday and 34 year old was sent home from school w/ GI bug.  No head or chest congestion.  Is able to exercise every morning w/o difficulty but sxs occur as day goes on.  Pt reports when she develops SOB and fatigue her heart is racing.  + HAs, some cold intolerance.  Pt denies change in stress level or  medication.  Reviewed CBC and BMP done yesterday in ER.  Review of Systems For ROS see HPI   Allergies Patient is allergic to erythromycin base and ciprofloxacin.  Past Medical History Patient  has a past medical history of Allergy, Asthma, Blood in stool, GERD (gastroesophageal reflux disease), and History of chicken pox.  Surgical History Patient  has a past surgical history that includes urethra polyp and Wisdom tooth extraction.  Family History Pateint's family history includes Asthma in her sister and sister; Diabetes in her sister; Early death in her father and maternal grandmother; Healthy in her daughter; Heart attack in her maternal grandfather; Hyperlipidemia in her maternal grandfather; Hypertension in her maternal grandfather; Thyroid disease in her mother.  Social History Patient  reports that she has never smoked. She has never used smokeless tobacco. She reports that she does not drink alcohol and does not use drugs.    Objective: There were no vitals filed for this visit.  There is no height or weight on file to calculate BMI.  Physical Exam AAOx3, NAD NCAT, EOMI No obvious CN deficits Coloring WNL Pt is able to speak clearly, coherently without shortness of breath or increased work of breathing.  Thought process is linear.  Mood is appropriate.     Assessment/plan:  Tachycardia, Fatigue, SOB, Chest tightness- new.  The symptoms occur together and occur randomly throughout the day.  No apparent relation to food or caffeine or stress.  She is able to exercise vigorously w/o difficulty  so unlikely to be CAD/PE.  + family hx of thyroid disease.  Did discuss possibility of COVID but no one else is exhibiting sxs- so will keep that on the back burner.  She does have a home test she is willing to take.  Encouraged her to do so.  CBC and BMP from ER last night WNL.  Will get thyroid labs and also preliminary w/u for pheo.  Will get holter monitor.  Encouraged her to log  her sxs.  Reviewed supportive care and red flags that should prompt return.  Pt expressed understanding and is in agreement w/ plan.     No follow-ups on file.  Neena Rhymes, MD   Yolanda Manges, MD [facility]  09/15/2020

## 2020-09-18 ENCOUNTER — Encounter: Payer: Self-pay | Admitting: Physical Therapy

## 2020-09-18 ENCOUNTER — Other Ambulatory Visit: Payer: Self-pay

## 2020-09-18 ENCOUNTER — Ambulatory Visit: Payer: BC Managed Care – PPO | Attending: Obstetrics and Gynecology | Admitting: Physical Therapy

## 2020-09-18 DIAGNOSIS — M545 Low back pain, unspecified: Secondary | ICD-10-CM

## 2020-09-18 DIAGNOSIS — R252 Cramp and spasm: Secondary | ICD-10-CM | POA: Diagnosis not present

## 2020-09-18 DIAGNOSIS — M6281 Muscle weakness (generalized): Secondary | ICD-10-CM | POA: Diagnosis not present

## 2020-09-18 DIAGNOSIS — M533 Sacrococcygeal disorders, not elsewhere classified: Secondary | ICD-10-CM | POA: Diagnosis not present

## 2020-09-18 NOTE — Therapy (Signed)
[facility] [address] [place], [place], [address] Phone: [phone]   Fax:  [phone]  Physical Therapy Treatment  Patient Details  Name: Lauren Small MRN: [id number] Date of Birth: 03/16/1986 Referring Provider (PT): Dr. Lucillie Garfinkel   Encounter Date: 09/18/2020   PT End of Session - 09/18/20 0837    Visit Number 27    Date for PT Re-Evaluation 09/01/20    Authorization Type bcbs    PT Start Time 0800    PT Stop Time 0830    PT Time Calculation (min) 30 min    Activity Tolerance Patient tolerated treatment well;No increased pain    Behavior During Therapy WFL for tasks assessed/performed           Past Medical History:  Diagnosis Date  . Allergy   . Asthma   . Blood in stool   . GERD (gastroesophageal reflux disease)   . History of chicken pox     Past Surgical History:  Procedure Laterality Date  . urethra polyp    . WISDOM TOOTH EXTRACTION      There were no vitals filed for this visit.   Subjective Assessment - 09/18/20 0801    Subjective I am feeling pretty good. I have the low back tgithtness.    Patient Stated Goals reduce the tailbone pain, sacral pain    Currently in Pain? No/denies              [facility] PT Assessment - 09/18/20 0001      Assessment   Medical Diagnosis M54.5 Low back pain; sacral pain    Referring Provider (PT) Dr. Lucillie Garfinkel    Onset Date/Surgical Date 09/15/19    Prior Therapy yes      Precautions   Precautions None      Cognition   Overall Cognitive Status Within Functional Limits for tasks assessed      AROM   Lumbar Extension full    Lumbar - Right Side Bend full    Lumbar - Right Rotation full    Lumbar - Left Rotation full      Strength   Right Hip Flexion 5/5    Right Hip Extension 5/5    Right Hip External Rotation  5/5    Right Hip ABduction 5/5    Right Hip ADduction 5/5    Left Hip External Rotation 5/5      Palpation   SI  assessment  ASIS are equal      Pelvic Compression   Findings Negative      Gaenslen's test   Findings Negative                      Pelvic Floor Special Questions - 09/18/20 0001    Diastasis Recti 2 finges above the umbilicus   good tension            [facility] Adult PT Treatment/Exercise - 09/18/20 0001      Exercises   Exercises Other Exercises    Other Exercises  discussed her exercise program, how to add in rotation to work on the diastasis, adding weight to strengthen abdomen      Lumbar Exercises: Seated   Other Seated Lumbar Exercises seated trunk rotation to stretch the thoracic area      Manual Therapy   Manual Therapy Joint mobilization;Soft tissue mobilization;Myofascial release    Manual therapy comments manual to assess for dry neelding    Joint Mobilization  PA and rotational mobs to T5-T12    Soft tissue mobilization using the addaday to the thoracic paraspinals    Myofascial Release tissue rolling of the thoracic area            Trigger Point Dry Needling - 09/18/20 0001    Consent Given? Yes    Education Handout Provided Previously provided    Muscles Treated Back/Hip Thoracic multifidi    Thoracic multifidi response Twitch response elicited;Palpable increased muscle length                PT Education - 09/18/20 0837    Education Details seated trunk rotation    Person(s) Educated Patient    Methods Explanation;Demonstration    Comprehension Verbalized understanding;Returned demonstration            PT Short Term Goals - 09/18/20 0840      PT SHORT TERM GOAL #1   Title independent with initial HEP    Time 4    Period Weeks    Status Achieved      PT SHORT TERM GOAL #2   Title pelvis stays in correct alignment for 4 weeks due to improved stability    Time 4    Period Weeks    Status Achieved      PT SHORT TERM GOAL #3   Title understand correct toileting techniques to improve constipation and ways to manage  consitpation    Time 4    Period Weeks    Status Achieved      PT SHORT TERM GOAL #4   Title pain with movement in bed decreased >/= 25%    Time 4    Period Weeks    Status Achieved      PT SHORT TERM GOAL #5   Title coccyx pain with sitting and bowel movements decreased >/= 25%    Time 4    Period Weeks    Status Achieved             PT Long Term Goals - 09/18/20 0841      PT LONG TERM GOAL #1   Title independent with advanced HEP    Time 8    Period Weeks    Status On-going      PT LONG TERM GOAL #2   Title coccyx pain decreased >/= 75% with sitting    Time 8    Period Weeks    Status Achieved      PT LONG TERM GOAL #3   Title diastasis recti decreases >/= 2 fingers width with good fascial tension    Time 4    Period Weeks    Status Achieved      PT LONG TERM GOAL #4   Title sitting and standing with pain decreased >/= 75% due to improve stability of SI joint and lumbar area    Time 8    Period Weeks    Status Achieved      PT LONG TERM GOAL #5   Title feeling of vagina area feeling heavy and swollen decreased >/= 75% with standing to shop or house work due to improved pelvic floor strength    Time 8    Period Weeks    Status Achieved                 Plan - 09/18/20 4098    Clinical Impression Statement Patient has met her goals. Her pelvis is in correct alignment. Patient has a 2 finger width diastasis  above the umbilicus with good tension. Patient is doing well with her exercise program adding weight and rotation to engage the core and increase tension of the diastasis. She is not having coccyx pain. Patient is ready for discharge.    Personal Factors and Comorbidities Comorbidity 1;Comorbidity 2    Comorbidities IBS, diastasis Recti    Examination-Activity Limitations Caring for Others;Stand;Toileting;Locomotion Level;Transfers;Bend;Sit    Examination-Participation Restrictions Community Activity;Interpersonal Relationship     Stability/Clinical Decision Making Evolving/Moderate complexity    Rehab Potential Excellent    PT Frequency 1x / week   for 1 more visit to review and discharge   PT Treatment/Interventions ADLs/Self Care Home Management;Biofeedback;Cryotherapy;Electrical Stimulation;Iontophoresis 4mg /ml Dexamethasone;Moist Heat;Ultrasound;Balance training;Therapeutic exercise;Therapeutic activities;Neuromuscular re-education;Patient/family education;Dry needling;Manual techniques;Taping;Spinal Manipulations    PT Next Visit Plan discharge to HEP    PT Home Exercise Plan Access Code: [email or web address]    Consulted and Agree with Plan of Care Patient           Patient will benefit from skilled therapeutic intervention in order to improve the following deficits and impairments:  Decreased coordination, Decreased range of motion, Increased fascial restricitons, Decreased endurance, Increased muscle spasms, Pain, Impaired flexibility, Decreased strength, Decreased mobility  Visit Diagnosis: Coccyx pain - Plan: PT plan of care cert/re-cert  Acute bilateral low back pain without sciatica - Plan: PT plan of care cert/re-cert  Cramp and spasm - Plan: PT plan of care cert/re-cert  Muscle weakness (generalized) - Plan: PT plan of care cert/re-cert     Problem List Patient Active Problem List   Diagnosis Date Noted  . Physical exam 04/24/2020  . Pregnant 09/15/2019    Steffen Hase 09/18/2020, 8:44 AM  [facility] [address] [place], [place], [address] Phone: [phone]   Fax:  [phone]  Name: Lauren Small MRN: [id number] Date of Birth: [birth year]  PHYSICAL THERAPY DISCHARGE SUMMARY  Visits from Start of Care: 27  Current functional level related to goals / functional outcomes: See above.    Remaining deficits: See above.   Education / Equipment: HEP Plan: Patient agrees to discharge.  Patient goals were met. Patient is being  discharged due to meeting the stated rehab goals. Thank you for the referral. Earlie Counts, PT 09/18/20 8:45 AM   ?????

## 2020-09-21 DIAGNOSIS — L814 Other melanin hyperpigmentation: Secondary | ICD-10-CM | POA: Diagnosis not present

## 2020-09-21 DIAGNOSIS — L219 Seborrheic dermatitis, unspecified: Secondary | ICD-10-CM | POA: Diagnosis not present

## 2020-09-21 DIAGNOSIS — L578 Other skin changes due to chronic exposure to nonionizing radiation: Secondary | ICD-10-CM | POA: Diagnosis not present

## 2020-09-21 DIAGNOSIS — D1801 Hemangioma of skin and subcutaneous tissue: Secondary | ICD-10-CM | POA: Diagnosis not present

## 2020-09-22 ENCOUNTER — Other Ambulatory Visit: Payer: Self-pay

## 2020-09-22 ENCOUNTER — Ambulatory Visit (INDEPENDENT_AMBULATORY_CARE_PROVIDER_SITE_OTHER): Payer: BC Managed Care – PPO

## 2020-09-22 DIAGNOSIS — R5383 Other fatigue: Secondary | ICD-10-CM

## 2020-09-22 DIAGNOSIS — R42 Dizziness and giddiness: Secondary | ICD-10-CM | POA: Diagnosis not present

## 2020-09-22 DIAGNOSIS — Z23 Encounter for immunization: Secondary | ICD-10-CM | POA: Diagnosis not present

## 2020-09-22 DIAGNOSIS — R000 Tachycardia, unspecified: Secondary | ICD-10-CM

## 2020-09-22 LAB — T4, FREE: Free T4: 1.06 ng/dL (ref 0.60–1.60)

## 2020-09-22 LAB — T3, FREE: T3, Free: 3.3 pg/mL (ref 2.3–4.2)

## 2020-09-22 LAB — TSH: TSH: 1.6 u[IU]/mL (ref 0.35–4.50)

## 2020-09-25 NOTE — Progress Notes (Signed)
Called pt and lmovm to return call.

## 2020-09-26 ENCOUNTER — Other Ambulatory Visit (INDEPENDENT_AMBULATORY_CARE_PROVIDER_SITE_OTHER): Payer: BC Managed Care – PPO

## 2020-09-26 DIAGNOSIS — R42 Dizziness and giddiness: Secondary | ICD-10-CM

## 2020-09-26 DIAGNOSIS — R5383 Other fatigue: Secondary | ICD-10-CM

## 2020-09-26 DIAGNOSIS — R0789 Other chest pain: Secondary | ICD-10-CM

## 2020-09-26 DIAGNOSIS — R000 Tachycardia, unspecified: Secondary | ICD-10-CM

## 2020-09-28 ENCOUNTER — Other Ambulatory Visit: Payer: Self-pay | Admitting: General Practice

## 2020-09-28 ENCOUNTER — Encounter: Payer: Self-pay | Admitting: Family Medicine

## 2020-09-28 DIAGNOSIS — R768 Other specified abnormal immunological findings in serum: Secondary | ICD-10-CM

## 2020-09-30 LAB — ANA: Anti Nuclear Antibody (ANA): POSITIVE — AB

## 2020-09-30 LAB — ANTI-NUCLEAR AB-TITER (ANA TITER)
ANA TITER: 1:80 {titer} — ABNORMAL HIGH
ANA Titer 1: 1:320 {titer} — ABNORMAL HIGH

## 2020-09-30 LAB — CATECHOLAMINES, FRACTIONATED, PLASMA
Dopamine: <10 pg/mL
Epinephrine: 32 pg/mL
Norepinephrine: 366 pg/mL
Total Catecholamines: 398 pg/mL

## 2020-10-02 ENCOUNTER — Encounter: Payer: Self-pay | Admitting: General Practice

## 2020-10-05 DIAGNOSIS — R42 Dizziness and giddiness: Secondary | ICD-10-CM | POA: Diagnosis not present

## 2020-10-16 ENCOUNTER — Other Ambulatory Visit: Payer: Self-pay | Admitting: General Practice

## 2020-10-16 DIAGNOSIS — R000 Tachycardia, unspecified: Secondary | ICD-10-CM

## 2020-10-16 DIAGNOSIS — R0789 Other chest pain: Secondary | ICD-10-CM

## 2020-10-19 DIAGNOSIS — Z20822 Contact with and (suspected) exposure to covid-19: Secondary | ICD-10-CM | POA: Diagnosis not present

## 2020-10-20 ENCOUNTER — Other Ambulatory Visit: Payer: Self-pay

## 2020-10-20 ENCOUNTER — Telehealth: Payer: Self-pay | Admitting: Radiology

## 2020-10-20 ENCOUNTER — Ambulatory Visit (INDEPENDENT_AMBULATORY_CARE_PROVIDER_SITE_OTHER): Payer: BC Managed Care – PPO | Admitting: Internal Medicine

## 2020-10-20 ENCOUNTER — Encounter: Payer: Self-pay | Admitting: Internal Medicine

## 2020-10-20 VITALS — BP 110/70 | HR 70 | Ht 66.0 in | Wt 161.0 lb

## 2020-10-20 DIAGNOSIS — R0789 Other chest pain: Secondary | ICD-10-CM | POA: Diagnosis not present

## 2020-10-20 DIAGNOSIS — I493 Ventricular premature depolarization: Secondary | ICD-10-CM

## 2020-10-20 DIAGNOSIS — R002 Palpitations: Secondary | ICD-10-CM | POA: Diagnosis not present

## 2020-10-20 NOTE — Patient Instructions (Signed)
Medication Instructions:  No Changes In Medications at this time.  *If you need a refill on your cardiac medications before your next appointment, please call your pharmacy*  Lab Work: None Ordered At This Time.  If you have labs (blood work) drawn today and your tests are completely normal, you will receive your results only by: Marland Kitchen MyChart Message (if you have MyChart) OR . A paper copy in the mail If you have any lab test that is abnormal or we need to change your treatment, we will call you to review the results.  Testing/Procedures: Your physician has requested that you have an echocardiogram. Echocardiography is a painless test that uses sound waves to create images of your heart. It provides your doctor with information about the size and shape of your heart and how well your heart's chambers and valves are working. You may receive an ultrasound enhancing agent through an IV if needed to better visualize your heart during the echo.This procedure takes approximately one hour. There are no restrictions for this procedure. This will take place at the [address], [address].   Your physician has recommended that you wear an event monitor. Event monitors are medical devices that record the heart's electrical activity. Doctors most often [place] these monitors to diagnose arrhythmias. Arrhythmias are problems with the speed or rhythm of the heartbeat. The monitor is a small, portable device. You can wear one while you do your normal daily activities. This is usually used to diagnose what is causing palpitations/syncope (passing out).  Follow-Up: At [facility], you and your health needs are our priority.  As part of our continuing mission to provide you with exceptional heart care, we have created designated Provider Care Teams.  These Care Teams include your primary Cardiologist (physician) and Advanced Practice Providers (APPs -  Physician Assistants and Nurse Practitioners) who all work  together to provide you with the care you need, when you need it.  Your next appointment:   8 week(s)  The format for your next appointment:   Virtual Visit   Provider:   Weston Brass, MD  Other Instructions  [employer] Cardiac Event Monitor Instructions Your physician has requested you wear your cardiac event monitor for 14 days, (1-30). [employer] may call or text to confirm a shipping address. The monitor will be sent to a land address via UPS. [employer] will not ship a monitor to a PO BOX. It typically takes 3-5 days to receive your monitor after it has been enrolled. [employer] will assist with USPS tracking if your package is delayed. The telephone number for [employer] is [phone]. Once you have received your monitor, please review the enclosed instructions. Instruction tutorials can also be viewed under help and settings on the enclosed cell phone. Your monitor has already been registered assigning a specific monitor serial # to you.  Applying the monitor Remove cell phone from case and turn it on. The cell phone works as IT consultant and needs to be within [employer] of you at all times. The cell phone will need to be charged on a daily basis. We recommend you plug the cell phone into the enclosed charger at your bedside table every night.  Monitor batteries: You will receive two monitor batteries labelled #1 and #2. These are your recorders. Plug battery #2 onto the second connection on the enclosed charger. Keep one battery on the charger at all times. This will keep the monitor battery deactivated. It will also keep it fully  charged for when you need to switch your monitor batteries. A small light will be blinking on the battery emblem when it is charging. The light on the battery emblem will remain on when the battery is fully charged.  Open package of a Monitor strip. Insert battery #1 into black hood on strip and gently squeeze monitor battery onto  connection as indicated in instruction booklet. Set aside while preparing skin.  Choose location for your strip, vertical or horizontal, as indicated in the instruction booklet. Shave to remove all hair from location. There cannot be any lotions, oils, powders, or colognes on skin where monitor is to be applied. Wipe skin clean with enclosed Saline wipe. Dry skin completely.  Peel paper labeled #1 off the back of the Monitor strip exposing the adhesive. Place the monitor on the chest in the vertical or horizontal position shown in the instruction booklet. One arrow on the monitor strip must be pointing upward. Carefully remove paper labeled #2, attaching remainder of strip to your skin. Try not to create any folds or wrinkles in the strip as you apply it.  Firmly press and release the circle in the center of the monitor battery. You will hear a small beep. This is turning the monitor battery on. The heart emblem on the monitor battery will light up every 5 seconds if the monitor battery in turned on and connected to the patient securely. Do not push and hold the circle down as this turns the monitor battery off. The cell phone will locate the monitor battery. A screen will appear on the cell phone checking the connection of your monitor strip. This may read poor connection initially but change to good connection within the next minute. Once your monitor accepts the connection you will hear a series of 3 beeps followed by a climbing crescendo of beeps. A screen will appear on the cell phone showing the two monitor strip placement options. Touch the picture that demonstrates where you applied the monitor strip.  Your monitor strip and battery are waterproof. You are able to shower, bathe, or swim with the monitor on. They just ask you do not submerge deeper than 3 feet underwater. We recommend removing the monitor if you are swimming in a lake, river, or ocean.  Your monitor battery will need  to be switched to a fully charged monitor battery approximately once a week. The cell phone will alert you of an action which needs to be made.  On the cell phone, tap for details to reveal connection status, monitor battery status, and cell phone battery status. The green dots indicates your monitor is in good status. A red dot indicates there is something that needs your attention.  To record a symptom, click the circle on the monitor battery. In 30-60 seconds a list of symptoms will appear on the cell phone. Select your symptom and tap save. Your monitor will record a sustained or significant arrhythmia regardless of you clicking the button. Some patients do not feel the heart rhythm irregularities. [employer] will notify us of any serious or critical events.  Refer to instruction booklet for instructions on switching batteries, changing strips, the Do not disturb or Pause features, or any additional questions.  Call [employer] at [phone], to confirm your monitor is transmitting and record your baseline. They will answer any questions you may have regarding the monitor instructions at that time.  Returning the monitor to [employer] Place all equipment back into blue box. Peel off strip of  paper to expose adhesive and close box securely. There is a prepaid UPS shipping label on this box. Drop in a UPS drop box, or at a UPS facility like [employer]. You may also contact [employer] to arrange UPS to pick up monitor package at your home.

## 2020-10-20 NOTE — Telephone Encounter (Signed)
Enrolled patient for a 14 day [employer] Event Monitor to be mailed to patients home.  

## 2020-10-20 NOTE — Progress Notes (Signed)
Cardiology Office Note:    Date:  10/20/2020   ID:  Lauren Small, DOB 25-Oct-1986, MRN [id number]  PCP:  Sheliah Hatch, MD  Cardiologist:  No primary care provider on file.  Electrophysiologist:  None   Referring MD: Sheliah Hatch, MD   Chief Complaint/Reason for Referral: Chest pain, palpitations  History of Present Illness:    Lauren Small is a 34 y.o. female with no significant past medical history who presents for 1 episode of chest and left arm discomfort palpitations.  She has had a symptom of head rushing shortness of breath several times a week for the past several months.  She notes however this symptom was also present during her pregnancy last year.  She delivered a child on September 14, 2020 and had an uncomplicated pregnancy.  No gestational hypertension or gestational diabetes.  No preeclampsia.  Previously she was having daily palpitations, shortness of breath and a feeling of being flushed/had rash.  She is contacted her primary doctor for this symptom and was instructed to go to the ER.  She had a negative evaluation for ischemia in the ER.  On her way to the ER she had an episode of left-sided chest discomfort with a feeling of blood rash to the left arm.  She has not had a recurrence of chest pain.  Further evaluation has revealed a positive ANA with titer 1:320.  She has rheumatology evaluation pending.  Her palpitations she has worn a 3-day monitor with evidence of primarily sinus rhythm and infrequent PVCs.  While she did have symptoms during her monitor they were not her most significant or typical symptoms.  No family history of premature CAD or sudden cardiac death.  Maternal grandfather had heart disease later in life.  Her sister is a type I diabetic.  She exercises daily and denies exertional chest pain or shortness of breath.  Symptoms improve with exercise.  She has not had syncope.  The patient denies PND, orthopnea, or leg  swelling. Denies cough, fever, chills. Denies nausea, vomiting. Denies syncope or presyncope. Denies dizziness or lightheadedness. Denies snoring.  No history of smoking, infrequent alcohol use.  No herbal supplement or recreational drug use.   Past Medical History:  Diagnosis Date  . Allergy   . Asthma   . Blood in stool   . GERD (gastroesophageal reflux disease)   . History of chicken pox     Past Surgical History:  Procedure Laterality Date  . urethra polyp    . WISDOM TOOTH EXTRACTION      Current Medications: Current Meds  Medication Sig  . albuterol (VENTOLIN HFA) 108 (90 Base) MCG/ACT inhaler Inhale 2 puffs into the lungs every 6 (six) hours as needed for wheezing or shortness of breath.  . cetirizine (ZYRTEC ALLERGY) 10 MG tablet Take 10 mg by mouth daily.   . fluticasone (FLONASE) 50 MCG/ACT nasal spray Place 2 sprays into both nostrils daily.      Allergies:   Erythromycin base and Ciprofloxacin   Social History   Tobacco Use  . Smoking status: Never Smoker  . Smokeless tobacco: Never Used  Vaping Use  . Vaping Use: Never used  Substance Use Topics  . Alcohol use: Never  . Drug use: Never     Family History: The patient's family history includes Asthma in her sister and sister; Diabetes in her sister; Early death in her father and maternal grandmother; Healthy in her daughter; Heart attack in her maternal grandfather; Hyperlipidemia in  her maternal grandfather; Hypertension in her maternal grandfather; Thyroid disease in her mother.  ROS:   Please see the history of present illness.    All other systems reviewed and are negative.  EKGs/Labs/Other Studies Reviewed:    The following studies were reviewed today:  EKG: Sinus rhythm, rate 70 beats minute, incomplete right bundle branch block, QRS duration 98 ms Recent Labs: 04/24/2020: ALT 12 09/14/2020: BUN 16; Creatinine, Ser 0.76; Hemoglobin 13.2; Platelets 296; Potassium 3.8; Sodium 138 09/22/2020: TSH  1.60  Recent Lipid Panel    Component Value Date/Time   CHOL 183 04/24/2020 0840   TRIG 83.0 04/24/2020 0840   HDL 48.60 04/24/2020 0840   CHOLHDL 4 04/24/2020 0840   VLDL 16.6 04/24/2020 0840   LDLCALC 117 (H) 04/24/2020 0840    Physical Exam:    VS:  BP 110/70   Pulse 70   Ht 5\' 6"  (1.676 m)   Wt 161 lb (73 kg)   SpO2 99%   BMI 25.99 kg/m     Wt Readings from Last 5 Encounters:  10/20/20 161 lb (73 kg)  09/15/20 163 lb (73.9 kg)  04/24/20 163 lb 6 oz (74.1 kg)  09/15/19 195 lb (88.5 kg)  02/17/19 150 lb 4 oz (68.2 kg)    Constitutional: No acute distress Eyes: sclera non-icteric, normal conjunctiva and lids ENMT: normal dentition, moist mucous membranes Cardiovascular: regular rhythm, normal rate, no murmurs. S1 and S2 normal. Radial pulses normal bilaterally. No jugular venous distention.  Respiratory: clear to auscultation bilaterally GI : normal bowel sounds, soft and nontender. No distention.   MSK: extremities warm, well perfused. No edema.  NEURO: grossly nonfocal exam, moves all extremities. PSYCH: alert and oriented x 3, normal mood and affect.   ASSESSMENT:    1. PVC (premature ventricular contraction)   2. Chest tightness   3. Palpitations    PLAN:    She does not feel we captured her symptoms with a 3-day monitor.  We will repeat a monitor for 14 days per patient request.  Question whether she is having episodes of SVT.  I have instructed her on vagal maneuvers to try during an episode.  We discussed the role of beta-blockade in PVCs and symptomatic palpitations, she would like to defer at this time.  We will complete an echocardiogram to exclude structural causes of chest discomfort and palpitations.  Total time of encounter: 30 minutes total time of encounter, including 20 minutes spent in face-to-face patient care on the date of this encounter. This time includes coordination of care and counseling regarding above mentioned problem list. Remainder  of non-face-to-face time involved reviewing chart documents/testing relevant to the patient encounter and documentation in the medical record. I have independently reviewed documentation from referring provider.   02/19/19, MD [place]  [facility]    Medication Adjustments/Labs and Tests Ordered: Current medicines are reviewed at length with the patient today.  Concerns regarding medicines are outlined above.   Orders Placed This Encounter  Procedures  . CARDIAC EVENT MONITOR  . EKG 12-Lead  . ECHOCARDIOGRAM COMPLETE    No orders of the defined types were placed in this encounter.   Patient Instructions  Medication Instructions:  No Changes In Medications at this time.  *If you need a refill on your cardiac medications before your next appointment, please call your pharmacy*  Lab Work: None Ordered At This Time.  If you have labs (blood work) drawn today and your tests are completely normal, you  will receive your results only by: Marland Kitchen. MyChart Message (if you have MyChart) OR . A paper copy in the mail If you have any lab test that is abnormal or we need to change your treatment, we will call you to review the results.  Testing/Procedures: Your physician has requested that you have an echocardiogram. Echocardiography is a painless test that uses sound waves to create images of your heart. It provides your doctor with information about the size and shape of your heart and how well your heart's chambers and valves are working. You may receive an ultrasound enhancing agent through an IV if needed to better visualize your heart during the echo.This procedure takes approximately one hour. There are no restrictions for this procedure. This will take place at [address]   Your physician has recommended that you wear an event monitor. Event monitors are medical devices that record the heart's electrical activity. Doctors most often us these monitors to  diagnose arrhythmias. Arrhythmias are problems with the speed or rhythm of the heartbeat. The monitor is a small, portable device. You can wear one while you do your normal daily activities. This is usually used to diagnose what is causing palpitations/syncope (passing out).  Follow-Up: [facility], you and your health needs are our priority.  As part of our continuing mission to provide you with exceptional heart care, we have created designated Provider Care Teams.  These Care Teams include your primary Cardiologist (physician) and Advanced Practice Providers (APPs -  Physician Assistants and Nurse Practitioners) who all work together to provide you with the care you need, when you need it.  Your next appointment:   8 week(s)  The format for your next appointment:   Virtual Visit   Provider:   Weston BrassGayatri Vannak Montenegro, MD  Other Instructions  [employer] Cardiac Event Monitor Instructions Your physician has requested you wear your cardiac event monitor for 14 days, (1-30). [employer] may call or text to confirm a shipping address. The monitor will be sent to a land address via UPS. [employer] will not ship a monitor to a PO BOX. It typically takes 3-5 days to receive your monitor after it has been enrolled. [employer] will assist with USPS tracking if your package is delayed. The telephone number for [employer] is [phone]. Once you have received your monitor, please review the enclosed instructions. Instruction tutorials can also be viewed under help and settings on the enclosed cell phone. Your monitor has already been registered assigning a specific monitor serial # to you.  Applying the monitor Remove cell phone from case and turn it on. The cell phone works as IT consultantyour transmitter and needs to be within [employer] feet of you at all times. The cell phone will need to be charged on a daily basis. We recommend you plug the cell phone into the enclosed charger at your bedside table every  night.  Monitor batteries: You will receive two monitor batteries labelled #1 and #2. These are your recorders. Plug battery #2 onto the second connection on the enclosed charger. Keep one battery on the charger at all times. This will keep the monitor battery deactivated. It will also keep it fully charged for when you need to switch your monitor batteries. A small light will be blinking on the battery emblem when it is charging. The light on the battery emblem will remain on when the battery is fully charged.  Open package of a Monitor strip. Insert battery #1 into black  hood on strip and gently squeeze monitor battery onto connection as indicated in instruction booklet. Set aside while preparing skin.  Choose location for your strip, vertical or horizontal, as indicated in the instruction booklet. Shave to remove all hair from location. There cannot be any lotions, oils, powders, or colognes on skin where monitor is to be applied. Wipe skin clean with enclosed Saline wipe. Dry skin completely.  Peel paper labeled #1 off the back of the Monitor strip exposing the adhesive. Place the monitor on the chest in the vertical or horizontal position shown in the instruction booklet. One arrow on the monitor strip must be pointing upward. Carefully remove paper labeled #2, attaching remainder of strip to your skin. Try not to create any folds or wrinkles in the strip as you apply it.  Firmly press and release the circle in the center of the monitor battery. You will hear a small beep. This is turning the monitor battery on. The heart emblem on the monitor battery will light up every 5 seconds if the monitor battery in turned on and connected to the patient securely. Do not push and hold the circle down as this turns the monitor battery off. The cell phone will locate the monitor battery. A screen will appear on the cell phone checking the connection of your monitor strip. This may read poor  connection initially but change to good connection within the next minute. Once your monitor accepts the connection you will hear a series of 3 beeps followed by a climbing crescendo of beeps. A screen will appear on the cell phone showing the two monitor strip placement options. Touch the picture that demonstrates where you applied the monitor strip.  Your monitor strip and battery are waterproof. You are able to shower, bathe, or swim with the monitor on. They just ask you do not submerge deeper than 3 feet underwater. We recommend removing the monitor if you are swimming in a lake, river, or ocean.  Your monitor battery will need to be switched to a fully charged monitor battery approximately once a week. The cell phone will alert you of an action which needs to be made.  On the cell phone, tap for details to reveal connection status, monitor battery status, and cell phone battery status. The green dots indicates your monitor is in good status. A red dot indicates there is something that needs your attention.  To record a symptom, click the circle on the monitor battery. In 30-60 seconds a list of symptoms will appear on the cell phone. Select your symptom and tap save. Your monitor will record a sustained or significant arrhythmia regardless of you clicking the button. Some patients do not feel the heart rhythm irregularities. [employer] will notify us of any serious or critical events.  Refer to instruction booklet for instructions on switching batteries, changing strips, the Do not disturb or Pause features, or any additional questions.  Call [employer] at [phone], to confirm your monitor is transmitting and record your baseline. They will answer any questions you may have regarding the monitor instructions at that time.  Returning the monitor to [employer] Place all equipment back into blue box. Peel off strip of paper to expose adhesive and close box securely. There is a  prepaid UPS shipping label on this box. Drop in a UPS drop box, or at a UPS facility like [employer]. You may also contact [employer] to arrange UPS to pick up monitor package at your home.

## 2020-11-01 ENCOUNTER — Encounter (INDEPENDENT_AMBULATORY_CARE_PROVIDER_SITE_OTHER): Payer: BC Managed Care – PPO

## 2020-11-01 DIAGNOSIS — I493 Ventricular premature depolarization: Secondary | ICD-10-CM

## 2020-11-01 DIAGNOSIS — R0789 Other chest pain: Secondary | ICD-10-CM

## 2020-11-01 DIAGNOSIS — R002 Palpitations: Secondary | ICD-10-CM

## 2020-11-08 DIAGNOSIS — R768 Other specified abnormal immunological findings in serum: Secondary | ICD-10-CM | POA: Diagnosis not present

## 2020-11-08 DIAGNOSIS — E010 Iodine-deficiency related diffuse (endemic) goiter: Secondary | ICD-10-CM | POA: Diagnosis not present

## 2020-11-09 ENCOUNTER — Other Ambulatory Visit (HOSPITAL_COMMUNITY): Payer: BC Managed Care – PPO

## 2020-11-20 ENCOUNTER — Other Ambulatory Visit: Payer: Self-pay | Admitting: Physician Assistant

## 2020-11-20 DIAGNOSIS — R768 Other specified abnormal immunological findings in serum: Secondary | ICD-10-CM

## 2020-11-21 DIAGNOSIS — M9903 Segmental and somatic dysfunction of lumbar region: Secondary | ICD-10-CM | POA: Diagnosis not present

## 2020-11-21 DIAGNOSIS — M9905 Segmental and somatic dysfunction of pelvic region: Secondary | ICD-10-CM | POA: Diagnosis not present

## 2020-11-21 DIAGNOSIS — M9908 Segmental and somatic dysfunction of rib cage: Secondary | ICD-10-CM | POA: Diagnosis not present

## 2020-11-21 DIAGNOSIS — M9904 Segmental and somatic dysfunction of sacral region: Secondary | ICD-10-CM | POA: Diagnosis not present

## 2020-11-23 DIAGNOSIS — J018 Other acute sinusitis: Secondary | ICD-10-CM | POA: Diagnosis not present

## 2020-11-23 DIAGNOSIS — Z6826 Body mass index (BMI) 26.0-26.9, adult: Secondary | ICD-10-CM | POA: Diagnosis not present

## 2020-11-23 DIAGNOSIS — Z01419 Encounter for gynecological examination (general) (routine) without abnormal findings: Secondary | ICD-10-CM | POA: Diagnosis not present

## 2020-11-24 DIAGNOSIS — M9903 Segmental and somatic dysfunction of lumbar region: Secondary | ICD-10-CM | POA: Diagnosis not present

## 2020-11-24 DIAGNOSIS — M9908 Segmental and somatic dysfunction of rib cage: Secondary | ICD-10-CM | POA: Diagnosis not present

## 2020-11-24 DIAGNOSIS — M9905 Segmental and somatic dysfunction of pelvic region: Secondary | ICD-10-CM | POA: Diagnosis not present

## 2020-11-24 DIAGNOSIS — M9904 Segmental and somatic dysfunction of sacral region: Secondary | ICD-10-CM | POA: Diagnosis not present

## 2020-11-28 DIAGNOSIS — M9903 Segmental and somatic dysfunction of lumbar region: Secondary | ICD-10-CM | POA: Diagnosis not present

## 2020-11-28 DIAGNOSIS — M9905 Segmental and somatic dysfunction of pelvic region: Secondary | ICD-10-CM | POA: Diagnosis not present

## 2020-11-28 DIAGNOSIS — M9904 Segmental and somatic dysfunction of sacral region: Secondary | ICD-10-CM | POA: Diagnosis not present

## 2020-11-28 DIAGNOSIS — M9908 Segmental and somatic dysfunction of rib cage: Secondary | ICD-10-CM | POA: Diagnosis not present

## 2020-11-29 ENCOUNTER — Ambulatory Visit (HOSPITAL_COMMUNITY): Payer: BC Managed Care – PPO | Attending: Internal Medicine

## 2020-11-29 ENCOUNTER — Other Ambulatory Visit: Payer: Self-pay

## 2020-11-29 DIAGNOSIS — R0789 Other chest pain: Secondary | ICD-10-CM | POA: Diagnosis not present

## 2020-11-29 DIAGNOSIS — R002 Palpitations: Secondary | ICD-10-CM | POA: Diagnosis not present

## 2020-11-29 DIAGNOSIS — I493 Ventricular premature depolarization: Secondary | ICD-10-CM | POA: Diagnosis not present

## 2020-11-30 LAB — ECHOCARDIOGRAM COMPLETE
Area-P 1/2: 3.6 cm2
S' Lateral: 3.1 cm

## 2020-12-01 ENCOUNTER — Other Ambulatory Visit: Payer: BC Managed Care – PPO

## 2020-12-08 ENCOUNTER — Encounter: Payer: Self-pay | Admitting: Internal Medicine

## 2020-12-08 ENCOUNTER — Telehealth (INDEPENDENT_AMBULATORY_CARE_PROVIDER_SITE_OTHER): Payer: BC Managed Care – PPO | Admitting: Internal Medicine

## 2020-12-08 VITALS — Ht 66.0 in | Wt 155.0 lb

## 2020-12-08 DIAGNOSIS — R002 Palpitations: Secondary | ICD-10-CM | POA: Diagnosis not present

## 2020-12-08 DIAGNOSIS — R0789 Other chest pain: Secondary | ICD-10-CM | POA: Diagnosis not present

## 2020-12-08 DIAGNOSIS — M9904 Segmental and somatic dysfunction of sacral region: Secondary | ICD-10-CM | POA: Diagnosis not present

## 2020-12-08 DIAGNOSIS — M9903 Segmental and somatic dysfunction of lumbar region: Secondary | ICD-10-CM | POA: Diagnosis not present

## 2020-12-08 DIAGNOSIS — M9905 Segmental and somatic dysfunction of pelvic region: Secondary | ICD-10-CM | POA: Diagnosis not present

## 2020-12-08 DIAGNOSIS — I491 Atrial premature depolarization: Secondary | ICD-10-CM

## 2020-12-08 DIAGNOSIS — M9908 Segmental and somatic dysfunction of rib cage: Secondary | ICD-10-CM | POA: Diagnosis not present

## 2020-12-08 DIAGNOSIS — I493 Ventricular premature depolarization: Secondary | ICD-10-CM | POA: Diagnosis not present

## 2020-12-08 NOTE — Patient Instructions (Signed)
Medication Instructions:  No Changes In Medications at this time.  *If you need a refill on your cardiac medications before your next appointment, please call your pharmacy*  Follow-Up: At [facility], you and your health needs are our priority.  As part of our continuing mission to provide you with exceptional heart care, we have created designated Provider Care Teams.  These Care Teams include your primary Cardiologist (physician) and Advanced Practice Providers (APPs -  Physician Assistants and Nurse Practitioners) who all work together to provide you with the care you need, when you need it.  Your next appointment:   AS NEEDED   The format for your next appointment:   In Person  Provider:   Gayatri Acharya, MD  

## 2020-12-08 NOTE — Progress Notes (Signed)
Virtual Visit via Telephone Note   This visit type was conducted due to national recommendations for restrictions regarding the COVID-19 Pandemic (e.g. social distancing) in an effort to limit this patient's exposure and mitigate transmission in our community.  Due to her co-morbid illnesses, this patient is at least at moderate risk for complications without adequate follow up.  This format is felt to be most appropriate for this patient at this time.  The patient did not have access to video technology/had technical difficulties with video requiring transitioning to audio format only (telephone).  All issues noted in this document were discussed and addressed.  No physical exam could be performed with this format.  Please refer to the patient's chart for her  consent to telehealth for [facility].    Date:  12/08/2020   ID:  Lauren Small, DOB 1986/05/17, MRN [id number] The patient was identified using 2 identifiers.  Patient Location: Home Provider Location: Home Office  PCP:  Sheliah Hatch, MD  Cardiologist:  No primary care provider on file.  Electrophysiologist:  None   Evaluation Performed:  Follow-Up Visit  Chief Complaint:  Chest pain, palpitations  History of Present Illness:    Lauren Small is a 34 y.o. female with no significant past medical history and uncomplicated pregnancy and delivery of baby  Sept 23, 2021.  We reviewed monitor results showing symptomatic but infrequent ectopy. Echo was normal.  Reviewed role for beta blockade. Reviewed conservative measures including reducing caffeine specifically energy drinks, hydration, following up thyroid evaluation, and relationship to any rare alcohol consumption. She will watch for these and journal activities and symptoms.   The patient does not have symptoms concerning for COVID-19 infection (fever, chills, cough, or new shortness of breath).    Past Medical History:  Diagnosis Date   Allergy     Asthma    Blood in stool    GERD (gastroesophageal reflux disease)    History of chicken pox    Past Surgical History:  Procedure Laterality Date   urethra polyp     WISDOM TOOTH EXTRACTION       Current Meds  Medication Sig   albuterol (VENTOLIN HFA) 108 (90 Base) MCG/ACT inhaler Inhale 2 puffs into the lungs every 6 (six) hours as needed for wheezing or shortness of breath.   cetirizine (ZYRTEC) 10 MG tablet Take 10 mg by mouth daily.    fluticasone (FLONASE) 50 MCG/ACT nasal spray Place 2 sprays into both nostrils daily.      Allergies:   Erythromycin base and Ciprofloxacin   Social History   Tobacco Use   Smoking status: Never Smoker   Smokeless tobacco: Never Used  Vaping Use   Vaping Use: Never used  Substance Use Topics   Alcohol use: Never   Drug use: Never     Family Hx: The patient's family history includes Asthma in her sister and sister; Diabetes in her sister; Early death in her father and maternal grandmother; Healthy in her daughter; Heart attack in her maternal grandfather; Hyperlipidemia in her maternal grandfather; Hypertension in her maternal grandfather; Thyroid disease in her mother.  ROS:   Please see the history of present illness.     All other systems reviewed and are negative.   Prior CV studies:   The following studies were reviewed today:    Labs/Other Tests and Data Reviewed:    EKG:  cardiac monitor reviewed  Recent Labs: 04/24/2020: ALT 12 09/14/2020: BUN 16; Creatinine, Ser 0.76; Hemoglobin 13.2;  Platelets 296; Potassium 3.8; Sodium 138 09/22/2020: TSH 1.60   Recent Lipid Panel Lab Results  Component Value Date/Time   CHOL 183 04/24/2020 08:40 AM   TRIG 83.0 04/24/2020 08:40 AM   HDL 48.60 04/24/2020 08:40 AM   CHOLHDL 4 04/24/2020 08:40 AM   LDLCALC 117 (H) 04/24/2020 08:40 AM    Wt Readings from Last 3 Encounters:  12/08/20 155 lb (70.3 kg)  10/20/20 161 lb (73 kg)  09/15/20 163 lb (73.9 kg)     Risk  Assessment/Calculations:      Objective:    Vital Signs:  Ht 5\' 6"  (1.676 m)    Wt 155 lb (70.3 kg)    BMI 25.02 kg/m    VITAL SIGNS:  reviewed GEN:  no acute distress RESPIRATORY:  normal respiratory effort, no increased work of breathing NEURO:  alert and oriented x 3, speech normal PSYCH:  normal affect   ASSESSMENT & PLAN:    1. PVC (premature ventricular contraction)   2. PAC (premature atrial contraction)   3. Palpitations   4. Chest tightness    PVCs/PACs/palpitations - monitor, defers medication therapy today and I concur this is reasonable.   Chest tightness - no recurrence. Consider ETT if symptoms occur again. Echo structurally normal.   COVID-19 Education: The signs and symptoms of COVID-19 were discussed with the patient and how to seek care for testing (follow up with PCP or arrange E-visit).  The importance of social distancing was discussed today.  Time:   Today, I have spent 15 minutes with the patient with telehealth technology discussing the above problems.     Medication Adjustments/Labs and Tests Ordered: Current medicines are reviewed at length with the patient today.  Concerns regarding medicines are outlined above.   Patient Instructions  Medication Instructions:  No Changes In Medications at this time.  *If you need a refill on your cardiac medications before your next appointment, please call your pharmacy*  Follow-Up: At [facility], you and your health needs are our priority.  As part of our continuing mission to provide you with exceptional heart care, we have created designated Provider Care Teams.  These Care Teams include your primary Cardiologist (physician) and Advanced Practice Providers (APPs -  Physician Assistants and Nurse Practitioners) who all work together to provide you with the care you need, when you need it.  Your next appointment:   AS NEEDED  The format for your next appointment:   In Person  Provider:   [facility], MD     Signed, Weston Brass, MD  12/08/2020 8:33 AM    [facility]

## 2020-12-12 DIAGNOSIS — M9904 Segmental and somatic dysfunction of sacral region: Secondary | ICD-10-CM | POA: Diagnosis not present

## 2020-12-12 DIAGNOSIS — M9908 Segmental and somatic dysfunction of rib cage: Secondary | ICD-10-CM | POA: Diagnosis not present

## 2020-12-12 DIAGNOSIS — M9905 Segmental and somatic dysfunction of pelvic region: Secondary | ICD-10-CM | POA: Diagnosis not present

## 2020-12-12 DIAGNOSIS — M9903 Segmental and somatic dysfunction of lumbar region: Secondary | ICD-10-CM | POA: Diagnosis not present

## 2020-12-13 ENCOUNTER — Ambulatory Visit
Admission: RE | Admit: 2020-12-13 | Discharge: 2020-12-13 | Disposition: A | Discharge status: Home / Self Care | Payer: BC Managed Care – PPO | Source: Ambulatory Visit | Attending: Physician Assistant | Admitting: Physician Assistant

## 2020-12-13 DIAGNOSIS — R768 Other specified abnormal immunological findings in serum: Secondary | ICD-10-CM

## 2020-12-13 DIAGNOSIS — R760 Raised antibody titer: Secondary | ICD-10-CM | POA: Diagnosis not present

## 2020-12-20 DIAGNOSIS — M9908 Segmental and somatic dysfunction of rib cage: Secondary | ICD-10-CM | POA: Diagnosis not present

## 2020-12-20 DIAGNOSIS — M9903 Segmental and somatic dysfunction of lumbar region: Secondary | ICD-10-CM | POA: Diagnosis not present

## 2020-12-20 DIAGNOSIS — M9905 Segmental and somatic dysfunction of pelvic region: Secondary | ICD-10-CM | POA: Diagnosis not present

## 2020-12-20 DIAGNOSIS — M9904 Segmental and somatic dysfunction of sacral region: Secondary | ICD-10-CM | POA: Diagnosis not present

## 2020-12-21 ENCOUNTER — Other Ambulatory Visit: Payer: Self-pay

## 2020-12-21 ENCOUNTER — Encounter: Payer: Self-pay | Admitting: Family Medicine

## 2020-12-21 MED ORDER — ALBUTEROL SULFATE HFA 108 (90 BASE) MCG/ACT IN AERS: 2.0000 | INHALATION_SPRAY | Freq: Four times a day (QID) | RESPIRATORY_TRACT | 3 refills | Status: DC | PRN

## 2021-01-03 DIAGNOSIS — Z1152 Encounter for screening for COVID-19: Secondary | ICD-10-CM | POA: Diagnosis not present

## 2021-02-23 ENCOUNTER — Ambulatory Visit (INDEPENDENT_AMBULATORY_CARE_PROVIDER_SITE_OTHER): Payer: BC Managed Care – PPO | Admitting: Family Medicine

## 2021-02-23 ENCOUNTER — Other Ambulatory Visit: Payer: Self-pay

## 2021-02-23 ENCOUNTER — Encounter: Payer: Self-pay | Admitting: Family Medicine

## 2021-02-23 VITALS — BP 94/70 | Ht 66.0 in | Wt 155.0 lb

## 2021-02-23 DIAGNOSIS — M5412 Radiculopathy, cervical region: Secondary | ICD-10-CM | POA: Insufficient documentation

## 2021-02-23 MED ORDER — PREDNISONE 5 MG PO TABS: ORAL_TABLET | ORAL | 0 refills | Status: DC

## 2021-02-23 NOTE — Progress Notes (Signed)
  Lauren Small - 35 y.o. female MRN [id number]  Date of birth: 10-17-1986  SUBJECTIVE:  Including CC & ROS.  No chief complaint on file.   Lauren Small is a 35 y.o. female that is presenting with left-sided neck pain. Pain is been ongoing for a few days. Denies any injury or inciting event. Has pain on the left side of the head as well with a chest wall and upper back. No history of surgery.   Review of Systems See HPI   HISTORY: Past Medical, Surgical, Social, and Family History Reviewed & Updated per EMR.   Pertinent Historical Findings include:  Past Medical History:  Diagnosis Date  . Allergy   . Asthma   . Blood in stool   . GERD (gastroesophageal reflux disease)   . History of chicken pox     Past Surgical History:  Procedure Laterality Date  . urethra polyp    . WISDOM TOOTH EXTRACTION      Family History  Problem Relation Age of Onset  . Thyroid disease Mother   . Early death Father   . Asthma Sister   . Diabetes Sister        type 1  . Healthy Daughter   . Early death Maternal Grandmother   . Heart attack Maternal Grandfather   . Hyperlipidemia Maternal Grandfather   . Hypertension Maternal Grandfather   . Asthma Sister     Social History   Socioeconomic History  . Marital status: Married    Spouse name: Not on file  . Number of children: Not on file  . Years of education: Not on file  . Highest education level: Not on file  Occupational History  . Not on file  Tobacco Use  . Smoking status: Never Smoker  . Smokeless tobacco: Never Used  Vaping Use  . Vaping Use: Never used  Substance and Sexual Activity  . Alcohol use: Never  . Drug use: Never  . Sexual activity: Yes  Other Topics Concern  . Not on file  Social History Narrative  . Not on file   Social Determinants of Health   Financial Resource Strain: Not on file  Food Insecurity: Not on file  Transportation Needs: Not on file  Physical Activity: Not on file  Stress: Not  on file  Social Connections: Not on file  Intimate Partner Violence: Not on file     PHYSICAL EXAM:  VS: BP 94/70 (BP Location: Right Arm, Patient Position: Sitting, Cuff Size: Normal)   Ht 5\' 6"  (1.676 m)   Wt 155 lb (70.3 kg)   BMI 25.02 kg/m  Physical Exam Gen: NAD, alert, cooperative with exam, well-appearing MSK:  Neck: Normal flexion and extension. Normal lateral rotation to the right. Limited to left. Trigger points appreciated in the trapezius on the left. Normal strength in left shoulder. Neurovascular intact     ASSESSMENT & PLAN:   Cervical radiculopathy Symptoms seem most consistent with nerve irritation. Having trigger points appreciated on exam. -Counseled on home exercise therapy and supportive care. -Prednisone. -Could consider imaging or physical therapy.

## 2021-02-23 NOTE — Patient Instructions (Signed)
Nice to meet you Please try heat Please try the exercises   Please send me a message in MyChart with any questions or updates.  Please see me back in 4 weeks.   --Dr. Moishe Schellenberg  

## 2021-02-23 NOTE — Assessment & Plan Note (Signed)
Symptoms seem most consistent with nerve irritation. Having trigger points appreciated on exam. -Counseled on home exercise therapy and supportive care. -Prednisone. -Could consider imaging or physical therapy.

## 2021-04-17 DIAGNOSIS — N854 Malposition of uterus: Secondary | ICD-10-CM | POA: Diagnosis not present

## 2021-04-17 DIAGNOSIS — R102 Pelvic and perineal pain: Secondary | ICD-10-CM | POA: Diagnosis not present

## 2021-04-17 DIAGNOSIS — Z309 Encounter for contraceptive management, unspecified: Secondary | ICD-10-CM | POA: Diagnosis not present

## 2021-04-24 ENCOUNTER — Other Ambulatory Visit: Payer: Self-pay

## 2021-04-24 ENCOUNTER — Ambulatory Visit (INDEPENDENT_AMBULATORY_CARE_PROVIDER_SITE_OTHER): Payer: BC Managed Care – PPO | Admitting: Family

## 2021-04-24 ENCOUNTER — Encounter: Payer: Self-pay | Admitting: Family

## 2021-04-24 VITALS — BP 110/70 | HR 65 | Temp 98.0°F | Wt 156.4 lb

## 2021-04-24 DIAGNOSIS — H109 Unspecified conjunctivitis: Secondary | ICD-10-CM

## 2021-04-24 MED ORDER — POLYMYXIN B-TRIMETHOPRIM 10000-0.1 UNIT/ML-% OP SOLN: 1.0000 [drp] | Freq: Four times a day (QID) | OPHTHALMIC | 0 refills | Status: DC

## 2021-04-24 NOTE — Patient Instructions (Signed)
Bacterial Conjunctivitis, Adult Bacterial conjunctivitis is an infection of the clear membrane that covers the white part of your eye and the inner surface of your eyelid (conjunctiva). When the blood vessels in your conjunctiva become inflamed, your eye becomes red or pink, and it will probably feel itchy. Bacterial conjunctivitis spreads very easily from person to person (is contagious). It also spreads easily from one eye to the other eye. What are the causes? This condition is caused by bacteria. You may get the infection if you come into close contact with:  A person who is infected with the bacteria.  Items that are contaminated with the bacteria, such as a face towel, contact lens solution, or eye makeup. What increases the risk? You are more likely to develop this condition if you:  Are exposed to other people who have the infection.  Wear contact lenses.  Have a sinus infection.  Have had a recent eye injury or surgery.  Have a weak body defense system (immune system).  Have a medical condition that causes dry eyes. What are the signs or symptoms? Symptoms of this condition include:  Thick, yellowish discharge from the eye. This may turn into a crust on the eyelid overnight and cause your eyelids to stick together.  Tearing or watery eyes.  Itchy eyes.  Burning feeling in your eyes.  Eye redness.  Swollen eyelids.  Blurred vision.   How is this diagnosed? This condition is diagnosed based on your symptoms and medical history. Your health care provider may also take a sample of discharge from your eye to find the cause of your infection. This is rarely done. How is this treated? This condition may be treated with:  Antibiotic eye drops or ointment to clear the infection more quickly and prevent the spread of infection to others.  Oral antibiotic medicines to treat infections that do not respond to drops or ointments or that last longer than 10 days.  Cool, wet  cloths (cool compresses) placed on the eyes.  Artificial tears applied 2-6 times a day.   Follow these instructions at home: Medicines  Take or apply your antibiotic medicine as told by your health care provider. Do not stop taking or applying the antibiotic even if you start to feel better.  Take or apply over-the-counter and prescription medicines only as told by your health care provider.  Be very careful to avoid touching the edge of your eyelid with the eye-drop bottle or the ointment tube when you apply medicines to the affected eye. This will keep you from spreading the infection to your other eye or to other people. Managing discomfort  Gently wipe away any drainage from your eye with a warm, wet washcloth or a cotton ball.  Apply a clean, cool compress to your eye for 10-20 minutes, 3-4 times a day. General instructions  Do not wear contact lenses until the inflammation is gone and your health care provider says it is safe to wear them again. Ask your health care provider how to sterilize or replace your contact lenses before you use them again. Wear glasses until you can resume wearing contact lenses.  Avoid wearing eye makeup until the inflammation is gone. Throw away any old eye cosmetics that may be contaminated.  Change or wash your pillowcase every day.  Do not share towels or washcloths. This may spread the infection.  Wash your hands often with soap and water. Use paper towels to dry your hands.  Avoid touching or rubbing your   eyes.  Do not drive or use heavy machinery if your vision is blurred. Contact a health care provider if:  You have a fever.  Your symptoms do not get better after 10 days. Get help right away if you have:  A fever and your symptoms suddenly get worse.  Severe pain when you move your eye.  Facial pain, redness, or swelling.  Sudden loss of vision. Summary  Bacterial conjunctivitis is an infection of the clear membrane that covers  the white part of your eye and the inner surface of your eyelid (conjunctiva).  Bacterial conjunctivitis spreads very easily from person to person (is contagious).  Wash your hands often with soap and water. Use paper towels to dry your hands.  Take or apply your antibiotic medicine as told by your health care provider. Do not stop taking or applying the antibiotic even if you start to feel better.  Contact a health care provider if you have a fever or your symptoms do not get better after 10 days. This information is not intended to replace advice given to you by your health care provider. Make sure you discuss any questions you have with your health care provider. Document Revised: 03/30/2019 Document Reviewed: 07/15/2018 Elsevier Patient Education  2021 Elsevier Inc[employer]  

## 2021-04-25 ENCOUNTER — Telehealth: Payer: Self-pay

## 2021-04-25 NOTE — Telephone Encounter (Signed)
Good morning patient was seen yesterday for pink eye and would like to know if we can send something in for preventative for her husband or recommend something for him to take seeing on how contagious pink eye is  Please advise  Patient is requesting a call back

## 2021-04-25 NOTE — Telephone Encounter (Signed)
I spoke with the pt to give message below. Pt voiced understanding.  

## 2021-04-25 NOTE — Progress Notes (Signed)
Acute Office Visit  Subjective:    Patient ID: Lauren Small, female    DOB: 05/22/1986, 35 y.o.   MRN: [id number]  Chief Complaint  Patient presents with  . right eye    Started today, eye is red. Hard to open, burn, painful, itches. Used allergy eye drops    HPI Patient is in today with c/o red, burning, watery, matting left eye that started this morning. She reports feeling sluggish and having a slight headache. Has tried an OTC allergy eye drop that has not been helpful.  Past Medical History:  Diagnosis Date  . Allergy   . Asthma   . Blood in stool   . GERD (gastroesophageal reflux disease)   . History of chicken pox     Past Surgical History:  Procedure Laterality Date  . urethra polyp    . WISDOM TOOTH EXTRACTION      Family History  Problem Relation Age of Onset  . Thyroid disease Mother   . Early death Father   . Asthma Sister   . Diabetes Sister        type 1  . Healthy Daughter   . Early death Maternal Grandmother   . Heart attack Maternal Grandfather   . Hyperlipidemia Maternal Grandfather   . Hypertension Maternal Grandfather   . Asthma Sister     Social History   Socioeconomic History  . Marital status: Married    Spouse name: Not on file  . Number of children: Not on file  . Years of education: Not on file  . Highest education level: Not on file  Occupational History  . Not on file  Tobacco Use  . Smoking status: Never Smoker  . Smokeless tobacco: Never Used  Vaping Use  . Vaping Use: Never used  Substance and Sexual Activity  . Alcohol use: Never  . Drug use: Never  . Sexual activity: Yes  Other Topics Concern  . Not on file  Social History Narrative  . Not on file   Social Determinants of Health   Financial Resource Strain: Not on file  Food Insecurity: Not on file  Transportation Needs: Not on file  Physical Activity: Not on file  Stress: Not on file  Social Connections: Not on file  Intimate Partner Violence: Not  on file    Outpatient Medications Prior to Visit  Medication Sig Dispense Refill  . albuterol (VENTOLIN HFA) 108 (90 Base) MCG/ACT inhaler Inhale 2 puffs into the lungs every 6 (six) hours as needed for wheezing or shortness of breath. 18 g 3  . cetirizine (ZYRTEC) 10 MG tablet Take 10 mg by mouth daily.     . fluticasone (FLONASE) 50 MCG/ACT nasal spray Place 2 sprays into both nostrils daily.     . predniSONE (DELTASONE) 5 MG tablet Take 6 pills for first day, 5 pills second day, 4 pills third day, 3 pills fourth day, 2 pills the fifth day, and 1 pill sixth day. (Patient not taking: Reported on 04/24/2021) 21 tablet 0   No facility-administered medications prior to visit.    Allergies  Allergen Reactions  . Erythromycin Base Rash       . Ciprofloxacin Rash    Review of Systems  Constitutional: Negative.   HENT: Negative.   Eyes: Positive for discharge and redness.  Respiratory: Negative.   Cardiovascular: Negative.   Neurological: Negative.   Hematological: Negative.   All other systems reviewed and are negative.      Objective:  Physical Exam Vitals and nursing note reviewed.  Constitutional:      Appearance: Normal appearance.  HENT:     Right Ear: Tympanic membrane normal.     Left Ear: Tympanic membrane normal.  Eyes:     General:        Right eye: Discharge present.     Extraocular Movements: Extraocular movements intact.     Pupils: Pupils are equal, round, and reactive to light.     Comments: Right eye red, watery, and matting. Yellowish discharge  Cardiovascular:     Rate and Rhythm: Normal rate and regular rhythm.     Pulses: Normal pulses.     Heart sounds: Normal heart sounds.  Pulmonary:     Effort: Pulmonary effort is normal.     Breath sounds: Normal breath sounds.  Musculoskeletal:        General: Normal range of motion.     Cervical back: Normal range of motion and neck supple.  Skin:    General: Skin is warm and dry.  Neurological:      General: No focal deficit present.     Mental Status: She is alert and oriented to person, place, and time.  Psychiatric:        Mood and Affect: Mood normal.        Behavior: Behavior normal.     BP 110/70   Pulse 65   Temp 98 F (36.7 C) (Temporal)   Wt 156 lb 6.4 oz (70.9 kg)   SpO2 98%   BMI 25.24 kg/m  Wt Readings from Last 3 Encounters:  04/24/21 156 lb 6.4 oz (70.9 kg)  02/23/21 155 lb (70.3 kg)  12/08/20 155 lb (70.3 kg)    Health Maintenance Due  Topic Date Due  . Hepatitis C Screening  Never done  . COVID-19 Vaccine (3 - Booster for Pfizer series) 09/08/2020    There are no preventive care reminders to display for this patient.   Lab Results  Component Value Date   TSH 1.60 09/22/2020   Lab Results  Component Value Date   WBC 6.4 09/14/2020   HGB 13.2 09/14/2020   HCT 40.5 09/14/2020   MCV 91.8 09/14/2020   PLT 296 09/14/2020   Lab Results  Component Value Date   NA 138 09/14/2020   K 3.8 09/14/2020   CO2 24 09/14/2020   GLUCOSE 102 (H) 09/14/2020   BUN 16 09/14/2020   CREATININE 0.76 09/14/2020   BILITOT 1.3 (H) 04/24/2020   ALKPHOS 47 04/24/2020   AST 12 04/24/2020   ALT 12 04/24/2020   PROT 6.9 04/24/2020   ALBUMIN 4.7 04/24/2020   CALCIUM 9.5 09/14/2020   ANIONGAP 10 09/14/2020   GFR 87.84 04/24/2020   Lab Results  Component Value Date   CHOL 183 04/24/2020   Lab Results  Component Value Date   HDL 48.60 04/24/2020   Lab Results  Component Value Date   LDLCALC 117 (H) 04/24/2020   Lab Results  Component Value Date   TRIG 83.0 04/24/2020   Lab Results  Component Value Date   CHOLHDL 4 04/24/2020   No results found for: HGBA1C     Assessment & Plan:   Problem List Items Addressed This Visit   None   Visit Diagnoses    Bacterial conjunctivitis of right eye    -  Primary       Meds ordered this encounter  Medications  . trimethoprim-polymyxin b (POLYTRIM) ophthalmic solution    Sig: Place  1 drop into both eyes  every 6 (six) hours.    Dispense:  10 mL    Refill:  0   Call the office with any questions or concerns. Recheck as needed.   Eulis Foster, FNP

## 2021-05-11 DIAGNOSIS — J0100 Acute maxillary sinusitis, unspecified: Secondary | ICD-10-CM | POA: Diagnosis not present

## 2021-05-16 ENCOUNTER — Encounter: Payer: Self-pay | Admitting: Family Medicine

## 2021-05-28 DIAGNOSIS — J301 Allergic rhinitis due to pollen: Secondary | ICD-10-CM | POA: Diagnosis not present

## 2021-05-28 DIAGNOSIS — J329 Chronic sinusitis, unspecified: Secondary | ICD-10-CM | POA: Diagnosis not present

## 2021-05-29 ENCOUNTER — Encounter: Payer: Self-pay | Admitting: Family Medicine

## 2021-06-19 DIAGNOSIS — J321 Chronic frontal sinusitis: Secondary | ICD-10-CM | POA: Diagnosis not present

## 2021-06-19 DIAGNOSIS — J342 Deviated nasal septum: Secondary | ICD-10-CM | POA: Diagnosis not present

## 2021-06-19 DIAGNOSIS — J323 Chronic sphenoidal sinusitis: Secondary | ICD-10-CM | POA: Diagnosis not present

## 2021-06-19 DIAGNOSIS — J3489 Other specified disorders of nose and nasal sinuses: Secondary | ICD-10-CM | POA: Diagnosis not present

## 2021-06-19 DIAGNOSIS — J329 Chronic sinusitis, unspecified: Secondary | ICD-10-CM | POA: Diagnosis not present

## 2021-06-20 ENCOUNTER — Encounter: Payer: Self-pay | Admitting: *Deleted

## 2021-06-22 DIAGNOSIS — J324 Chronic pansinusitis: Secondary | ICD-10-CM | POA: Diagnosis not present

## 2021-07-20 ENCOUNTER — Ambulatory Visit (INDEPENDENT_AMBULATORY_CARE_PROVIDER_SITE_OTHER): Payer: BC Managed Care – PPO | Admitting: Family Medicine

## 2021-07-20 ENCOUNTER — Other Ambulatory Visit: Payer: Self-pay

## 2021-07-20 ENCOUNTER — Telehealth: Payer: Self-pay

## 2021-07-20 VITALS — BP 124/72 | HR 71 | Temp 98.2°F | Resp 16 | Ht 66.0 in | Wt 155.4 lb

## 2021-07-20 DIAGNOSIS — B373 Candidiasis of vulva and vagina: Secondary | ICD-10-CM | POA: Diagnosis not present

## 2021-07-20 DIAGNOSIS — R21 Rash and other nonspecific skin eruption: Secondary | ICD-10-CM | POA: Diagnosis not present

## 2021-07-20 DIAGNOSIS — R6883 Chills (without fever): Secondary | ICD-10-CM

## 2021-07-20 DIAGNOSIS — J029 Acute pharyngitis, unspecified: Secondary | ICD-10-CM | POA: Diagnosis not present

## 2021-07-20 DIAGNOSIS — B3731 Acute candidiasis of vulva and vagina: Secondary | ICD-10-CM

## 2021-07-20 LAB — CBC
HCT: 38.6 % (ref 36.0–46.0)
Hemoglobin: 13.1 g/dL (ref 12.0–15.0)
MCHC: 33.9 g/dL (ref 30.0–36.0)
MCV: 91.3 fl (ref 78.0–100.0)
Platelets: 260 10*3/uL (ref 150.0–400.0)
RBC: 4.23 Mil/uL (ref 3.87–5.11)
RDW: 13.3 % (ref 11.5–15.5)
WBC: 5.6 10*3/uL (ref 4.0–10.5)

## 2021-07-20 MED ORDER — MAGIC MOUTHWASH W/LIDOCAINE: 5.0000 mL | Freq: Four times a day (QID) | ORAL | 0 refills | Status: DC | PRN

## 2021-07-20 NOTE — Progress Notes (Signed)
Subjective:  Patient ID: Lauren Small, female    DOB: 06-25-1986  Age: 35 y.o. MRN: [id number]  CC:  Chief Complaint  Patient presents with   Insect Bite    Pt reports bug bit Sunday evening felt burning and itching start reports red circle, mostly itches, denies drainage, notes oral tenderness starting at the same time with no other sxs     HPI Lauren Small presents for   Insect bite on R buttocks?  Noticed when going to sleep 5 days ago. No tick removed or visualized bug. Itching/burning.  No discharge. Initial redness, Has receded in size. Still itching.  Tx hydrocortisone BID.   Red spot on finger near ring - improved with hydrocortisone. Initial blister appearance.   Red spot under left axilla - resolved on own past few days, no treatment.  Soreness in mouth at sides of tongue and underneath - started 4 days. Some soreness in throat or neck with swallowing past 2 days. Drinking fluids ok.   Has been on 3 rounds of antibiotics past few months for sinus infections, most recent clindamycin on 06/22/21 for 14 days. Off for about 2 weeks. Followed by ENT.   Vaginal itching 5 days ago - took diflucan pill once initially and repeated yesterday for vaginal burning when in shower. No vaginal burning today. No discharge.   No cough.  Chills last night, no fever. No recent sick contacts known, but daughter's classmate tested positive for covid recently.  She and family had Covid infection last month.  Benadryl once last night.  Tired today. Nausea last night.  Mouth soreness more noticeable issue today.  Nausea with doxycyline prior.  Allergy sx's this am - sneezing, runny nose, itchy eyes - improved with allergy meds.   History Patient Active Problem List   Diagnosis Date Noted   Cervical radiculopathy 02/23/2021   Physical exam 04/24/2020   Pregnant 09/15/2019   Past Medical History:  Diagnosis Date   Allergy    Asthma    Blood in stool    GERD  (gastroesophageal reflux disease)    History of chicken pox    Past Surgical History:  Procedure Laterality Date   urethra polyp     WISDOM TOOTH EXTRACTION     Allergies  Allergen Reactions   Erythromycin Base Rash        Ciprofloxacin Rash   Prior to Admission medications   Medication Sig Start Date End Date Taking? Authorizing Provider  albuterol (VENTOLIN HFA) 108 (90 Base) MCG/ACT inhaler Inhale 2 puffs into the lungs every 6 (six) hours as needed for wheezing or shortness of breath. 12/21/20  Yes Sheliah Hatch, MD  cetirizine (ZYRTEC) 10 MG tablet Take 10 mg by mouth daily.    Yes [provider]  fluticasone (FLONASE) 50 MCG/ACT nasal spray Place 2 sprays into both nostrils daily.    Yes [provider]  trimethoprim-polymyxin b (POLYTRIM) ophthalmic solution Place 1 drop into both eyes every 6 (six) hours. 04/24/21   Eulis Foster, FNP   Social History   Socioeconomic History   Marital status: Married    Spouse name: Not on file   Number of children: Not on file   Years of education: Not on file   Highest education level: Not on file  Occupational History   Not on file  Tobacco Use   Smoking status: Never   Smokeless tobacco: Never  Vaping Use   Vaping Use: Never used  Substance and Sexual Activity  Alcohol use: Never   Drug use: Never   Sexual activity: Yes  Other Topics Concern   Not on file  Social History Narrative   Not on file   Social Determinants of Health   Financial Resource Strain: Not on file  Food Insecurity: Not on file  Transportation Needs: Not on file  Physical Activity: Not on file  Stress: Not on file  Social Connections: Not on file  Intimate Partner Violence: Not on file    Review of Systems Per HPI   Objective:   Vitals:   07/20/21 0843  BP: 124/72  Pulse: 71  Resp: 16  Temp: 98.2 F (36.8 C)  TempSrc: Temporal  SpO2: 98%  Weight: 155 lb 6.4 oz (70.5 kg)  Height: 5\' 6"  (1.676 m)      Physical Exam Vitals reviewed.  Constitutional:      General: She is not in acute distress.    Appearance: She is well-developed.  HENT:     Head: Normocephalic and atraumatic.     Right Ear: Hearing, tympanic membrane, ear canal and external ear normal.     Left Ear: Hearing, tympanic membrane, ear canal and external ear normal.     Nose: Nose normal.     Mouth/Throat:     Mouth: Mucous membranes are moist.     Pharynx: No posterior oropharyngeal erythema.     Comments: Moist oral mucosa, no erythema appreciated in the posterior oropharynx or exudate.  No vesicles.  No coating of buccal mucosa or dorsal tongue.  Minimal discomfort at the submandibular glands bilaterally but no definitive lymphadenopathy or swelling appreciated.  No stridor, speaking normally, swallowing secretions normally. Eyes:     Conjunctiva/sclera: Conjunctivae normal.     Pupils: Pupils are equal, round, and reactive to light.  Cardiovascular:     Rate and Rhythm: Normal rate and regular rhythm.     Heart sounds: Normal heart sounds. No murmur heard. Pulmonary:     Effort: Pulmonary effort is normal. No respiratory distress.     Breath sounds: Normal breath sounds. No wheezing or rhonchi.  Skin:    General: Skin is warm and dry.     Findings: No rash.     Comments: Small area of redness, excoriation at the left lateral base of finger.see photo.   Buttocks with 4 cm erythematous circular patch without surrounding erythema.  Slight darkening of area with minimal induration, no fluctuance.  I do not appreciate rash of axilla or wounds.  No vesicles.  Neurological:     Mental Status: She is alert and oriented to person, place, and time.  Psychiatric:        Mood and Affect: Mood normal.        Behavior: Behavior normal.    Photos from home on phone Monday, and Wednesday of rash on buttocks.         Assessment & Plan:  Lauren Small is a 35 y.o. female . Sore throat - Plan: magic  mouthwash w/lidocaine SOLN, POCT rapid strep A, Culture, Group A Strep Chills - Plan: CBC --Reassuring CBC.  Rapid strep testing negative, will check throat culture. -I did not see any vesicles, rash, or coating within the mouth to suggest thrush or apparent infectious cause.  Possible viral pharyngitis although posterior oropharynx overall reassuring.  Slight discomfort in submandibular glands but no appreciable lymphadenopathy.  Still possible viral syndrome.   -Symptomatic care discussed for now and will try Magic mouthwash with urgent care precautions given if  any new oral lesions. -Repeat COVID testing discussed although less likely with recent infection  -ER/urgent care precautions given  Rash  -On finger possible contact dermatitis, improving with topical hydrocortisone.  Other area on wrist is also improved.  Unlikely scabies with quick resolution of symptoms and no other involved areas.  RTC precautions, or possible Elimite if she does notice recurrence of pruritic lesions especially interdigital.  Rash and nonspecific skin eruption  -Possible insect vs. Spider bite on right upper buttocks with initial surrounding erythema which has receded.  Differential also includes cellulitis, but improving and with pruritic again lending more likelihood to insect bite.  Did not appreciate significant induration and no fluctuance on exam today.  After multiple recent antibiotics and with improvement decided against initial antibiotics at this time with close monitoring for improvement, urgent care precautions over the weekend if any worsening.   Candida vaginitis  - likely by history. Improved after home treatment with likely Diflucan x2.  RTC precautions if recurrent or if any new lesions immediate exam given other rash as above.  Meds ordered this encounter  Medications   magic mouthwash w/lidocaine SOLN    Sig: Take 5 mLs by mouth 4 (four) times daily as needed for mouth pain. With 1:1 nystatin  suspension.    Dispense:  120 mL    Refill:  0    Ok to substitute ingredients per pharmacy usual "magic mouthwash" prep.   Patient Instructions  Please check home a rapid COVID test today and again in 24 to 36 hours but unlikely cause.  I do recommend masking and isolating from others is much as possible for now until you have those results.  Rapid strep test was negative today but I will check a throat culture.  Okay to continue hydrocortisone cream to itching rash including area on buttocks as long as that is improving.  If return of vaginal itching or burning, would recommend repeat exam to make sure that yeast infection has not persisted versus other cause.  If any new blisters, wounds or rashes in either mouth or genitals, be seen to urgent care or ER.    If any increasing redness or worsening of the buttocks rash, be seen right away.  If any concerns on your blood count today I will let you know.  Try Magic mouthwash for the sore mouth, throat, but would like to recheck in the next 3 days to make sure that is improving.  If any fevers, worsening sore throat or difficulty swallowing or any worsening/new symptoms over the weekend be seen through urgent care.  Let me know if there are questions.     Signed,   Meredith Staggers, MD [facility], [facility] 07/20/21 2:03 PM

## 2021-07-20 NOTE — Patient Instructions (Signed)
Please check home a rapid COVID test today and again in 24 to 36 hours but unlikely cause.  I do recommend masking and isolating from others is much as possible for now until you have those results.  Rapid strep test was negative today but I will check a throat culture.  Okay to continue hydrocortisone cream to itching rash including area on buttocks as long as that is improving.  If return of vaginal itching or burning, would recommend repeat exam to make sure that yeast infection has not persisted versus other cause.  If any new blisters, wounds or rashes in either mouth or genitals, be seen to urgent care or ER.    If any increasing redness or worsening of the buttocks rash, be seen right away.  If any concerns on your blood count today I will let you know.  Try Magic mouthwash for the sore mouth, throat, but would like to recheck in the next 3 days to make sure that is improving.  If any fevers, worsening sore throat or difficulty swallowing or any worsening/new symptoms over the weekend be seen through urgent care.  Let me know if there are questions.

## 2021-07-22 LAB — CULTURE, GROUP A STREP
MICRO NUMBER:: 12180544
SPECIMEN QUALITY:: ADEQUATE

## 2021-07-23 ENCOUNTER — Encounter: Payer: Self-pay | Admitting: Family Medicine

## 2021-07-23 NOTE — Telephone Encounter (Signed)
Error

## 2021-07-24 DIAGNOSIS — J301 Allergic rhinitis due to pollen: Secondary | ICD-10-CM | POA: Diagnosis not present

## 2021-07-24 DIAGNOSIS — J324 Chronic pansinusitis: Secondary | ICD-10-CM | POA: Diagnosis not present

## 2021-07-24 DIAGNOSIS — J329 Chronic sinusitis, unspecified: Secondary | ICD-10-CM | POA: Diagnosis not present

## 2021-08-13 DIAGNOSIS — R140 Abdominal distension (gaseous): Secondary | ICD-10-CM | POA: Diagnosis not present

## 2021-08-13 DIAGNOSIS — L293 Anogenital pruritus, unspecified: Secondary | ICD-10-CM | POA: Diagnosis not present

## 2021-08-13 DIAGNOSIS — N760 Acute vaginitis: Secondary | ICD-10-CM | POA: Diagnosis not present

## 2021-08-13 DIAGNOSIS — B370 Candidal stomatitis: Secondary | ICD-10-CM | POA: Diagnosis not present

## 2021-08-15 DIAGNOSIS — K5900 Constipation, unspecified: Secondary | ICD-10-CM | POA: Diagnosis not present

## 2021-08-15 DIAGNOSIS — R140 Abdominal distension (gaseous): Secondary | ICD-10-CM | POA: Diagnosis not present

## 2021-08-15 DIAGNOSIS — B370 Candidal stomatitis: Secondary | ICD-10-CM | POA: Diagnosis not present

## 2021-08-21 DIAGNOSIS — R21 Rash and other nonspecific skin eruption: Secondary | ICD-10-CM | POA: Diagnosis not present

## 2021-09-13 DIAGNOSIS — L578 Other skin changes due to chronic exposure to nonionizing radiation: Secondary | ICD-10-CM | POA: Diagnosis not present

## 2021-09-13 DIAGNOSIS — D225 Melanocytic nevi of trunk: Secondary | ICD-10-CM | POA: Diagnosis not present

## 2021-09-13 DIAGNOSIS — L814 Other melanin hyperpigmentation: Secondary | ICD-10-CM | POA: Diagnosis not present

## 2021-09-13 DIAGNOSIS — D1801 Hemangioma of skin and subcutaneous tissue: Secondary | ICD-10-CM | POA: Diagnosis not present

## 2021-10-08 DIAGNOSIS — J342 Deviated nasal septum: Secondary | ICD-10-CM | POA: Diagnosis not present

## 2021-10-08 DIAGNOSIS — J301 Allergic rhinitis due to pollen: Secondary | ICD-10-CM | POA: Diagnosis not present

## 2021-10-08 DIAGNOSIS — J324 Chronic pansinusitis: Secondary | ICD-10-CM | POA: Diagnosis not present

## 2021-10-10 DIAGNOSIS — L271 Localized skin eruption due to drugs and medicaments taken internally: Secondary | ICD-10-CM | POA: Diagnosis not present

## 2021-10-10 DIAGNOSIS — Z23 Encounter for immunization: Secondary | ICD-10-CM | POA: Diagnosis not present

## 2021-10-14 NOTE — Telephone Encounter (Signed)
This concern has been previously addressed by myself and/or another provider.  If they patient has ongoing concerns, they can contact me at their convenience.  Thank you,  Rich Jimma Ortman, NP 

## 2021-11-12 DIAGNOSIS — J3489 Other specified disorders of nose and nasal sinuses: Secondary | ICD-10-CM | POA: Diagnosis not present

## 2021-11-12 DIAGNOSIS — J324 Chronic pansinusitis: Secondary | ICD-10-CM | POA: Diagnosis not present

## 2021-11-12 DIAGNOSIS — J342 Deviated nasal septum: Secondary | ICD-10-CM | POA: Diagnosis not present

## 2021-11-12 DIAGNOSIS — J329 Chronic sinusitis, unspecified: Secondary | ICD-10-CM | POA: Diagnosis not present

## 2021-11-12 DIAGNOSIS — J343 Hypertrophy of nasal turbinates: Secondary | ICD-10-CM | POA: Diagnosis not present

## 2021-11-12 DIAGNOSIS — G8929 Other chronic pain: Secondary | ICD-10-CM | POA: Diagnosis not present

## 2021-11-12 DIAGNOSIS — R519 Headache, unspecified: Secondary | ICD-10-CM | POA: Diagnosis not present

## 2021-12-05 ENCOUNTER — Encounter: Payer: Self-pay | Admitting: Registered Nurse

## 2021-12-05 ENCOUNTER — Other Ambulatory Visit: Payer: Self-pay

## 2021-12-05 ENCOUNTER — Ambulatory Visit (INDEPENDENT_AMBULATORY_CARE_PROVIDER_SITE_OTHER): Payer: BC Managed Care – PPO | Admitting: Registered Nurse

## 2021-12-05 VITALS — BP 116/69 | HR 60 | Temp 98.0°F | Resp 18 | Ht 66.0 in | Wt 155.0 lb

## 2021-12-05 DIAGNOSIS — R1011 Right upper quadrant pain: Secondary | ICD-10-CM

## 2021-12-05 NOTE — Patient Instructions (Signed)
Ms. Makena Murdock to meet you!  Sounds like gallstones. Assuming that until we an prove otherwise  Please return for labs here or at [address] tomorrow   I have ordered an ultrasound at [facility] [address] Hoping to get this done tomorrow or Friday. Myself or Alcario Drought will be in touch with scheduling instructions  If pain recurs and won't let up, stool or vomit has blood, fever spikes, nausea vomiting and diarrhea progress or don't let up after a few hours - head to ER.  Call with any concerns  Thank you  Rich

## 2021-12-05 NOTE — Progress Notes (Signed)
Established Patient Office Visit  Subjective:  Patient ID: Lauren Small, female    DOB: [birth year]  Age: 35 y.o. MRN: [id number]  CC:  Chief Complaint  Patient presents with   Back Pain    Patient states she has been having some back pain and pain under rib on Thursday and some vomiting and diarrhea     HPI Lauren Small presents for back pain and abdominal pain  Onset Thursday night - had nausea with vomiting.  Friday felt malaise, by evening had diarrhea Saturday woke with malaise and had diarrhea all night.  Better by 3pm on Sunday. Assumed it to be viral gastroenteritis at that time.  Then Monday started with URQ pain, right back pain, radiating towards shoulder.  Had been suggested in past that she has gallbladder issue - notes that her diet has affected symptoms in the past - fatty foods and fried foods have caused similar symptoms in the past.   Labs have been borderline high for bilirubin.   Past Medical History:  Diagnosis Date   Allergy    Asthma    Blood in stool    GERD (gastroesophageal reflux disease)    History of chicken pox     Past Surgical History:  Procedure Laterality Date   urethra polyp     WISDOM TOOTH EXTRACTION      Family History  Problem Relation Age of Onset   Thyroid disease Mother    Early death Father    Asthma Sister    Diabetes Sister        type 1   Healthy Daughter    Early death Maternal Grandmother    Heart attack Maternal Grandfather    Hyperlipidemia Maternal Grandfather    Hypertension Maternal Grandfather    Asthma Sister     Social History   Socioeconomic History   Marital status: Married    Spouse name: Not on file   Number of children: Not on file   Years of education: Not on file   Highest education level: Not on file  Occupational History   Not on file  Tobacco Use   Smoking status: Never   Smokeless tobacco: Never  Vaping Use   Vaping Use: Never used  Substance and Sexual Activity    Alcohol use: Never   Drug use: Never   Sexual activity: Yes  Other Topics Concern   Not on file  Social History Narrative   Not on file   Social Determinants of Health   Financial Resource Strain: Not on file  Food Insecurity: Not on file  Transportation Needs: Not on file  Physical Activity: Not on file  Stress: Not on file  Social Connections: Not on file  Intimate Partner Violence: Not on file    Outpatient Medications Prior to Visit  Medication Sig Dispense Refill   albuterol (VENTOLIN HFA) 108 (90 Base) MCG/ACT inhaler Inhale 2 puffs into the lungs every 6 (six) hours as needed for wheezing or shortness of breath. 18 g 3   cetirizine (ZYRTEC) 10 MG tablet Take 10 mg by mouth daily.      fluticasone (FLONASE) 50 MCG/ACT nasal spray Place 2 sprays into both nostrils daily.      magic mouthwash w/lidocaine SOLN Take 5 mLs by mouth 4 (four) times daily as needed for mouth pain. 120 mL 0   No facility-administered medications prior to visit.    Allergies  Allergen Reactions   Erythromycin Base Rash  Ciprofloxacin Rash    ROS Review of Systems  Constitutional: Negative.   HENT: Negative.    Eyes: Negative.   Respiratory: Negative.    Cardiovascular: Negative.   Gastrointestinal: Negative.   Genitourinary: Negative.   Musculoskeletal: Negative.   Skin: Negative.   Neurological: Negative.   Psychiatric/Behavioral: Negative.    All other systems reviewed and are negative.    Objective:    Physical Exam Vitals and nursing note reviewed.  Constitutional:      General: She is not in acute distress.    Appearance: Normal appearance. She is normal weight. She is not ill-appearing, toxic-appearing or diaphoretic.  Cardiovascular:     Rate and Rhythm: Normal rate and regular rhythm.     Heart sounds: Normal heart sounds. No murmur heard.   No friction rub. No gallop.  Pulmonary:     Effort: Pulmonary effort is normal. No respiratory distress.     Breath  sounds: Normal breath sounds. No stridor. No wheezing, rhonchi or rales.  Chest:     Chest wall: No tenderness.  Abdominal:     General: Abdomen is flat. Bowel sounds are normal.     Palpations: Abdomen is soft.  Skin:    General: Skin is warm and dry.  Neurological:     General: No focal deficit present.     Mental Status: She is alert and oriented to person, place, and time. Mental status is at baseline.  Psychiatric:        Mood and Affect: Mood normal.        Behavior: Behavior normal.        Thought Content: Thought content normal.        Judgment: Judgment normal.    BP 116/69    Pulse 60    Temp 98 F (36.7 C) (Temporal)    Resp 18    Ht 5\' 6"  (1.676 m)    Wt 155 lb (70.3 kg)    LMP 11/19/2021    SpO2 99%    BMI 25.02 kg/m  Wt Readings from Last 3 Encounters:  12/05/21 155 lb (70.3 kg)  07/20/21 155 lb 6.4 oz (70.5 kg)  04/24/21 156 lb 6.4 oz (70.9 kg)     Health Maintenance Due  Topic Date Due   Pneumococcal Vaccine 31-58 Years old (1 - PCV) Never done   COVID-19 Vaccine (3 - Pfizer risk series) 04/05/2020   PAP SMEAR-Modifier  08/17/2021    There are no preventive care reminders to display for this patient.  Lab Results  Component Value Date   TSH 1.60 09/22/2020   Lab Results  Component Value Date   WBC 5.6 07/20/2021   HGB 13.1 07/20/2021   HCT 38.6 07/20/2021   MCV 91.3 07/20/2021   PLT 260.0 07/20/2021   Lab Results  Component Value Date   NA 138 09/14/2020   K 3.8 09/14/2020   CO2 24 09/14/2020   GLUCOSE 102 (H) 09/14/2020   BUN 16 09/14/2020   CREATININE 0.76 09/14/2020   BILITOT 1.3 (H) 04/24/2020   ALKPHOS 47 04/24/2020   AST 12 04/24/2020   ALT 12 04/24/2020   PROT 6.9 04/24/2020   ALBUMIN 4.7 04/24/2020   CALCIUM 9.5 09/14/2020   ANIONGAP 10 09/14/2020   GFR 87.84 04/24/2020   Lab Results  Component Value Date   CHOL 183 04/24/2020   Lab Results  Component Value Date   HDL 48.60 04/24/2020   Lab Results  Component Value  Date   LDLCALC  117 (H) 04/24/2020   Lab Results  Component Value Date   TRIG 83.0 04/24/2020   Lab Results  Component Value Date   CHOLHDL 4 04/24/2020   No results found for: HGBA1C    Assessment & Plan:   Problem List Items Addressed This Visit   None Visit Diagnoses     Abdominal pain, RUQ (right upper quadrant)    -  Primary   Relevant Orders   US Abdomen Limited RUQ (LIVER/GB)   Comprehensive metabolic panel   CBC with Differential/Platelet       No orders of the defined types were placed in this encounter.   Follow-up: Return in about 1 day (around 12/06/2021) for Lab only - here or [facility].   PLAN Future labs placed - return here or to [facility]. Will order [place] RUQ to rule out gallstones  ER precautions given BRAT diet and hydration reviewed Patient encouraged to call clinic with any questions, comments, or concerns.  Janeece Agee, NP

## 2021-12-07 ENCOUNTER — Other Ambulatory Visit (INDEPENDENT_AMBULATORY_CARE_PROVIDER_SITE_OTHER): Payer: BC Managed Care – PPO

## 2021-12-07 DIAGNOSIS — R1011 Right upper quadrant pain: Secondary | ICD-10-CM | POA: Diagnosis not present

## 2021-12-07 LAB — COMPREHENSIVE METABOLIC PANEL
ALT: 10 U/L (ref 0–35)
AST: 9 U/L (ref 0–37)
Albumin: 4.3 g/dL (ref 3.5–5.2)
Alkaline Phosphatase: 28 U/L — ABNORMAL LOW (ref 39–117)
BUN: 11 mg/dL (ref 6–23)
CO2: 25 mEq/L (ref 19–32)
Calcium: 9.1 mg/dL (ref 8.4–10.5)
Chloride: 107 mEq/L (ref 96–112)
Creatinine, Ser: 0.79 mg/dL (ref 0.40–1.20)
GFR: 95.97 mL/min (ref >60.00)
Glucose, Bld: 77 mg/dL (ref 70–99)
Potassium: 4.1 mEq/L (ref 3.5–5.1)
Sodium: 138 mEq/L (ref 135–145)
Total Bilirubin: 2.4 mg/dL — ABNORMAL HIGH (ref 0.2–1.2)
Total Protein: 6.7 g/dL (ref 6.0–8.3)

## 2021-12-07 LAB — CBC WITH DIFFERENTIAL/PLATELET
Basophils Absolute: 0 10*3/uL (ref 0.0–0.1)
Basophils Relative: 1 % (ref 0.0–3.0)
Eosinophils Absolute: 0.2 10*3/uL (ref 0.0–0.7)
Eosinophils Relative: 4.5 % (ref 0.0–5.0)
HCT: 37.2 % (ref 36.0–46.0)
Hemoglobin: 12.5 g/dL (ref 12.0–15.0)
Lymphocytes Relative: 41.2 % (ref 12.0–46.0)
Lymphs Abs: 1.5 10*3/uL (ref 0.7–4.0)
MCHC: 33.6 g/dL (ref 30.0–36.0)
MCV: 89.9 fl (ref 78.0–100.0)
Monocytes Absolute: 0.2 10*3/uL (ref 0.1–1.0)
Monocytes Relative: 6.3 % (ref 3.0–12.0)
Neutro Abs: 1.7 10*3/uL (ref 1.4–7.7)
Neutrophils Relative %: 47 % (ref 43.0–77.0)
Platelets: 254 10*3/uL (ref 150.0–400.0)
RBC: 4.13 Mil/uL (ref 3.87–5.11)
RDW: 12.8 % (ref 11.5–15.5)
WBC: 3.6 10*3/uL — ABNORMAL LOW (ref 4.0–10.5)

## 2021-12-10 ENCOUNTER — Other Ambulatory Visit: Payer: Self-pay | Admitting: Registered Nurse

## 2021-12-10 ENCOUNTER — Encounter: Payer: Self-pay | Admitting: Registered Nurse

## 2021-12-10 ENCOUNTER — Ambulatory Visit
Admission: RE | Admit: 2021-12-10 | Discharge: 2021-12-10 | Disposition: A | Discharge status: Home / Self Care | Payer: BC Managed Care – PPO | Source: Ambulatory Visit | Attending: Registered Nurse | Admitting: Registered Nurse

## 2021-12-10 DIAGNOSIS — K828 Other specified diseases of gallbladder: Secondary | ICD-10-CM | POA: Diagnosis not present

## 2021-12-10 DIAGNOSIS — R197 Diarrhea, unspecified: Secondary | ICD-10-CM

## 2021-12-10 DIAGNOSIS — R1011 Right upper quadrant pain: Secondary | ICD-10-CM

## 2021-12-10 MED ORDER — DICYCLOMINE HCL 20 MG PO TABS: 20.0000 mg | ORAL_TABLET | Freq: Three times a day (TID) | ORAL | 0 refills | Status: DC | PRN

## 2022-01-25 ENCOUNTER — Encounter: Payer: Self-pay | Admitting: Family Medicine

## 2022-01-25 MED ORDER — ALBUTEROL SULFATE HFA 108 (90 BASE) MCG/ACT IN AERS: 2.0000 | INHALATION_SPRAY | Freq: Four times a day (QID) | RESPIRATORY_TRACT | 3 refills | Status: DC | PRN

## 2022-02-14 DIAGNOSIS — Z6824 Body mass index (BMI) 24.0-24.9, adult: Secondary | ICD-10-CM | POA: Diagnosis not present

## 2022-02-14 DIAGNOSIS — Z01419 Encounter for gynecological examination (general) (routine) without abnormal findings: Secondary | ICD-10-CM | POA: Diagnosis not present

## 2022-02-14 DIAGNOSIS — Z1151 Encounter for screening for human papillomavirus (HPV): Secondary | ICD-10-CM | POA: Diagnosis not present

## 2022-02-14 DIAGNOSIS — Z124 Encounter for screening for malignant neoplasm of cervix: Secondary | ICD-10-CM | POA: Diagnosis not present

## 2022-02-14 DIAGNOSIS — N841 Polyp of cervix uteri: Secondary | ICD-10-CM | POA: Diagnosis not present

## 2022-02-14 DIAGNOSIS — R5383 Other fatigue: Secondary | ICD-10-CM | POA: Diagnosis not present

## 2022-02-14 LAB — HM PAP SMEAR

## 2022-02-19 DIAGNOSIS — R790 Abnormal level of blood mineral: Secondary | ICD-10-CM | POA: Diagnosis not present

## 2022-02-19 DIAGNOSIS — R5383 Other fatigue: Secondary | ICD-10-CM | POA: Diagnosis not present

## 2022-02-19 DIAGNOSIS — Z1329 Encounter for screening for other suspected endocrine disorder: Secondary | ICD-10-CM | POA: Diagnosis not present

## 2022-02-19 DIAGNOSIS — E538 Deficiency of other specified B group vitamins: Secondary | ICD-10-CM | POA: Diagnosis not present

## 2022-02-19 DIAGNOSIS — R7989 Other specified abnormal findings of blood chemistry: Secondary | ICD-10-CM | POA: Diagnosis not present

## 2022-03-14 DIAGNOSIS — R790 Abnormal level of blood mineral: Secondary | ICD-10-CM | POA: Diagnosis not present

## 2022-03-14 DIAGNOSIS — R7989 Other specified abnormal findings of blood chemistry: Secondary | ICD-10-CM | POA: Diagnosis not present

## 2022-03-14 DIAGNOSIS — E538 Deficiency of other specified B group vitamins: Secondary | ICD-10-CM | POA: Diagnosis not present

## 2022-03-14 DIAGNOSIS — R1011 Right upper quadrant pain: Secondary | ICD-10-CM | POA: Diagnosis not present

## 2022-03-15 DIAGNOSIS — R790 Abnormal level of blood mineral: Secondary | ICD-10-CM | POA: Diagnosis not present

## 2022-04-19 DIAGNOSIS — I788 Other diseases of capillaries: Secondary | ICD-10-CM | POA: Diagnosis not present

## 2022-05-15 ENCOUNTER — Ambulatory Visit (INDEPENDENT_AMBULATORY_CARE_PROVIDER_SITE_OTHER): Payer: BC Managed Care – PPO | Admitting: Family Medicine

## 2022-05-15 ENCOUNTER — Encounter: Payer: Self-pay | Admitting: Family Medicine

## 2022-05-15 VITALS — BP 106/70 | HR 69 | Temp 98.4°F | Resp 17 | Ht 66.0 in | Wt 144.4 lb

## 2022-05-15 DIAGNOSIS — R109 Unspecified abdominal pain: Secondary | ICD-10-CM | POA: Diagnosis not present

## 2022-05-15 DIAGNOSIS — R519 Headache, unspecified: Secondary | ICD-10-CM

## 2022-05-15 DIAGNOSIS — M545 Low back pain, unspecified: Secondary | ICD-10-CM | POA: Diagnosis not present

## 2022-05-15 LAB — POCT URINALYSIS DIPSTICK
Bilirubin, UA: NEGATIVE
Blood, UA: NEGATIVE
Glucose, UA: NEGATIVE
Ketones, UA: NEGATIVE
Leukocytes, UA: NEGATIVE
Nitrite, UA: NEGATIVE
Protein, UA: NEGATIVE
Spec Grav, UA: 1.01 (ref 1.010–1.025)
Urobilinogen, UA: 0.2 E.U./dL
pH, UA: 7.5 (ref 5.0–8.0)

## 2022-05-15 LAB — POC COVID19 BINAXNOW: SARS Coronavirus 2 Ag: NEGATIVE

## 2022-05-15 MED ORDER — CEPHALEXIN 500 MG PO CAPS: 500.0000 mg | ORAL_CAPSULE | Freq: Two times a day (BID) | ORAL | 0 refills | Status: AC

## 2022-05-15 MED ORDER — MELOXICAM 15 MG PO TABS: 15.0000 mg | ORAL_TABLET | Freq: Every day | ORAL | 0 refills | Status: DC

## 2022-05-15 NOTE — Progress Notes (Signed)
   Subjective:    Patient ID: Lauren Small, female    DOB: May 04, 1986, 36 y.o.   MRN: [id number]  HPI Back pain/chills- sxs started ~10 days ago.  + nausea, 'extreme fatigue', had cloudy urine x1 last week.  No burning w/ urination or change in frequency.  + chills started yesterday along w/ HA.  HA described as a 'tension' HA that starts in neck and radiates into head.  No vomiting.  No fever.  No blood in urine.  Daughter's teacher recently had COVID but daughter is asymptomatic.  Denies cough, congestion, sinus pain/pressure, sore throat.   Review of Systems For ROS see HPI     Objective:   Physical Exam Vitals reviewed.  Constitutional:      Appearance: She is well-developed.  HENT:     Head: Normocephalic and atraumatic.  Eyes:     General: No scleral icterus.    Extraocular Movements: Extraocular movements intact.  Pulmonary:     Effort: Pulmonary effort is normal. No respiratory distress.     Breath sounds: Normal breath sounds.  Abdominal:     General: There is no distension.     Palpations: Abdomen is soft.     Tenderness: There is abdominal tenderness in the suprapubic area. There is right CVA tenderness. There is no left CVA tenderness, guarding or rebound.  Musculoskeletal:     Cervical back: Normal range of motion and neck supple. No rigidity.  Lymphadenopathy:     Cervical: No cervical adenopathy.  Skin:    General: Skin is warm and dry.  Neurological:     General: No focal deficit present.     Mental Status: She is alert and oriented to person, place, and time.  Psychiatric:        Mood and Affect: Mood normal.        Behavior: Behavior normal.        Thought Content: Thought content normal.          Assessment & Plan:  R flank pain- new.  Pt reports she has had intermittent back pain/abd pain off and on for months.  Current episode started ~10 days ago w/ nausea, 'extreme fatigue' and cloudy urine.  Since then urine has been clear.  Yesterday  developed chills.  Discussed that sxs could be consistent w/ ascending UTI (pt also has suprapubic TTP) or a stone.  Pt has not had CT done of abd but did have a normal [place].  UA WNL but given her sxs, will send for cx and start abx.  Will also get CT stone study.  Will start daily Meloxicam for pain and inflammation.  Reviewed supportive care and red flags that should prompt return.  Pt expressed understanding and is in agreement w/ plan.

## 2022-05-15 NOTE — Patient Instructions (Addendum)
Follow up as needed or as scheduled We'll notify you of your urine culture results START the Cephalexin twice daily x7 days We'll call you to schedule your CT scan TAKE the Meloxicam once daily- w/ food- for pain and inflammation Call with any questions or concerns Hang in there!!!

## 2022-05-16 ENCOUNTER — Ambulatory Visit (HOSPITAL_BASED_OUTPATIENT_CLINIC_OR_DEPARTMENT_OTHER)
Admission: RE | Admit: 2022-05-16 | Discharge: 2022-05-16 | Disposition: A | Discharge status: Home / Self Care | Payer: BC Managed Care – PPO | Source: Ambulatory Visit | Attending: Family Medicine | Admitting: Family Medicine

## 2022-05-16 ENCOUNTER — Encounter (HOSPITAL_BASED_OUTPATIENT_CLINIC_OR_DEPARTMENT_OTHER): Payer: Self-pay

## 2022-05-16 DIAGNOSIS — R109 Unspecified abdominal pain: Secondary | ICD-10-CM | POA: Insufficient documentation

## 2022-05-16 DIAGNOSIS — K820 Obstruction of gallbladder: Secondary | ICD-10-CM | POA: Diagnosis not present

## 2022-05-16 LAB — URINE CULTURE
MICRO NUMBER:: 13440443
Result:: NO GROWTH
SPECIMEN QUALITY:: ADEQUATE

## 2022-05-17 ENCOUNTER — Encounter: Payer: Self-pay | Admitting: Family Medicine

## 2022-05-21 ENCOUNTER — Encounter: Payer: Self-pay | Admitting: Family Medicine

## 2022-05-22 NOTE — Progress Notes (Signed)
Patient reached out via my chart with other questions

## 2022-05-27 ENCOUNTER — Ambulatory Visit: Payer: BC Managed Care – PPO | Admitting: Family Medicine

## 2022-05-27 ENCOUNTER — Encounter: Payer: Self-pay | Admitting: Family Medicine

## 2022-05-27 VITALS — BP 112/76 | HR 74 | Temp 97.5°F | Resp 16 | Ht 66.0 in | Wt 142.2 lb

## 2022-05-27 DIAGNOSIS — K820 Obstruction of gallbladder: Secondary | ICD-10-CM | POA: Diagnosis not present

## 2022-05-27 DIAGNOSIS — R233 Spontaneous ecchymoses: Secondary | ICD-10-CM | POA: Diagnosis not present

## 2022-05-27 DIAGNOSIS — R002 Palpitations: Secondary | ICD-10-CM | POA: Diagnosis not present

## 2022-05-27 DIAGNOSIS — R768 Other specified abnormal immunological findings in serum: Secondary | ICD-10-CM

## 2022-05-27 DIAGNOSIS — K8050 Calculus of bile duct without cholangitis or cholecystitis without obstruction: Secondary | ICD-10-CM

## 2022-05-27 DIAGNOSIS — R5383 Other fatigue: Secondary | ICD-10-CM | POA: Diagnosis not present

## 2022-05-27 LAB — BASIC METABOLIC PANEL
BUN: 16 mg/dL (ref 6–23)
CO2: 26 mEq/L (ref 19–32)
Calcium: 9.8 mg/dL (ref 8.4–10.5)
Chloride: 104 mEq/L (ref 96–112)
Creatinine, Ser: 0.79 mg/dL (ref 0.40–1.20)
GFR: 95.65 mL/min (ref >60.00)
Glucose, Bld: 90 mg/dL (ref 70–99)
Potassium: 4.2 mEq/L (ref 3.5–5.1)
Sodium: 138 mEq/L (ref 135–145)

## 2022-05-27 LAB — CBC WITH DIFFERENTIAL/PLATELET
Basophils Absolute: 0 10*3/uL (ref 0.0–0.1)
Basophils Relative: 1 % (ref 0.0–3.0)
Eosinophils Absolute: 0.1 10*3/uL (ref 0.0–0.7)
Eosinophils Relative: 2.1 % (ref 0.0–5.0)
HCT: 39.8 % (ref 36.0–46.0)
Hemoglobin: 13.4 g/dL (ref 12.0–15.0)
Lymphocytes Relative: 39.4 % (ref 12.0–46.0)
Lymphs Abs: 1.6 10*3/uL (ref 0.7–4.0)
MCHC: 33.8 g/dL (ref 30.0–36.0)
MCV: 92 fl (ref 78.0–100.0)
Monocytes Absolute: 0.2 10*3/uL (ref 0.1–1.0)
Monocytes Relative: 6.2 % (ref 3.0–12.0)
Neutro Abs: 2 10*3/uL (ref 1.4–7.7)
Neutrophils Relative %: 51.3 % (ref 43.0–77.0)
Platelets: 247 10*3/uL (ref 150.0–400.0)
RBC: 4.33 Mil/uL (ref 3.87–5.11)
RDW: 13.3 % (ref 11.5–15.5)
WBC: 3.9 10*3/uL — ABNORMAL LOW (ref 4.0–10.5)

## 2022-05-27 LAB — B12 AND FOLATE PANEL
Folate: >24.2 ng/mL (ref >5.9)
Vitamin B-12: >1504 pg/mL — ABNORMAL HIGH (ref 211–911)

## 2022-05-27 LAB — HEPATIC FUNCTION PANEL
ALT: 21 U/L (ref 0–35)
AST: 21 U/L (ref 0–37)
Albumin: 5 g/dL (ref 3.5–5.2)
Alkaline Phosphatase: 46 U/L (ref 39–117)
Bilirubin, Direct: 0.4 mg/dL — ABNORMAL HIGH (ref 0.0–0.3)
Total Bilirubin: 2.7 mg/dL — ABNORMAL HIGH (ref 0.2–1.2)
Total Protein: 7.7 g/dL (ref 6.0–8.3)

## 2022-05-27 LAB — PROTIME-INR
INR: 0.9 ratio (ref 0.8–1.0)
Prothrombin Time: 10.2 s (ref 9.6–13.1)

## 2022-05-27 LAB — VITAMIN D 25 HYDROXY (VIT D DEFICIENCY, FRACTURES): VITD: 46.49 ng/mL (ref 30.00–100.00)

## 2022-05-27 LAB — TSH: TSH: 2.15 u[IU]/mL (ref 0.35–5.50)

## 2022-05-27 LAB — SEDIMENTATION RATE: Sed Rate: 5 mm/hr (ref 0–20)

## 2022-05-27 NOTE — Progress Notes (Signed)
Subjective:    Patient ID: Lauren Small, female    DOB: [birth year], 36 y.o.   MRN: [id number]  HPI Fatigue- pt reports she felt 'lethargic' for most of last year (2022).  + easy bruising w/o injury.  Flank pain- R sided.  Had 2 episodes in December of 'waking up in horrible pain' with vomiting.  Has tried to eliminate greasy food and alcohol.  Most recently had a 6-8 week span between episodes.  Sxs tend to start w/ 'terrible fatigue and full body exhaustion'.  Sxs will last 10-14 days.  Can radiate up to shoulder or around to anterior abd.  Will intermittently develop chills.  + associated nausea  Palpitations- pt reports she will develop rapid heart rate and feel 'starving' at the same time.  Pt is eating q2-3 hrs.  When she has an episode she will eat and sxs temporarily improve.     Review of Systems For ROS see HPI     Objective:   Physical Exam Vitals reviewed.  Constitutional:      General: She is not in acute distress.    Appearance: Normal appearance. She is well-developed. She is not ill-appearing.  HENT:     Head: Normocephalic and atraumatic.  Eyes:     Conjunctiva/sclera: Conjunctivae normal.     Pupils: Pupils are equal, round, and reactive to light.  Neck:     Thyroid: No thyromegaly.  Cardiovascular:     Rate and Rhythm: Normal rate and regular rhythm.     Pulses: Normal pulses.     Heart sounds: Normal heart sounds. No murmur heard. Pulmonary:     Effort: Pulmonary effort is normal. No respiratory distress.     Breath sounds: Normal breath sounds.  Abdominal:     General: Bowel sounds are normal. There is no distension.     Palpations: Abdomen is soft.     Tenderness: There is no abdominal tenderness. There is no guarding or rebound.  Musculoskeletal:     Cervical back: Normal range of motion and neck supple.     Right lower leg: No edema.     Left lower leg: No edema.  Lymphadenopathy:     Cervical: No cervical adenopathy.  Skin:    General:  Skin is warm and dry.  Neurological:     General: No focal deficit present.     Mental Status: She is alert and oriented to person, place, and time.  Psychiatric:        Mood and Affect: Mood normal.        Behavior: Behavior normal.           Assessment & Plan:  Gallbladder contraction- noted on CT done 5/25.  Given that her sxs are consistent w/ biliary colic will refer to GI for complete evaluation and likely functional study of gallbladder.  In the meantime, encouraged her to avoid fatty and rich foods.  Fatigue- pt reports this has been much of the last year.  Not necessarily associated w/ GI sxs.  Will check labs to assess for vitamin deficiencies, metabolic causes, and rheumatologic processes.  Easy bruising- new.  Pt again reports this has been ongoing for most of the last year.  No known injury to cause bruising.  Will check labs to r/o coagulopathy.  Reviewed medications, no obvious cause noted.  Palpitations- new.  Pt states she will develop racing heart beat/palpitations and feel 'starving at the same time'.  She indicates that sxs improve w/ eating.  Asymptomatic  today.  Discussed possibility of low blood sugar vs low blood pressure vs both.  Encouraged increased fluids, increased salt intake, and eating regularly throughout the day.  + ANA- pt has hx of this.  Saw Rheum for 1 appt in the past but there was no dedicated f/u.  Given her ongoing fatigue, easy bruising, 'full body exhaustion' will repeat labs and refer to Rheum.  Pt expressed understanding and is in agreement w/ plan.

## 2022-05-27 NOTE — Patient Instructions (Signed)
We'll determine follow up based on lab results We'll call you with your GI referral Continue to avoid fatty, greasy, or rich foods until we figure out the gallbladder Make sure you are eating regularly throughout the day Lots of water!  Lots of salt!! Call with any questions or concerns Hang in there!!!

## 2022-05-29 ENCOUNTER — Encounter: Payer: Self-pay | Admitting: Family Medicine

## 2022-05-29 NOTE — Progress Notes (Signed)
Pt seen lab results Via my chart  

## 2022-05-30 LAB — ANTI-NUCLEAR AB-TITER (ANA TITER)
ANA TITER: 1:80 {titer} — ABNORMAL HIGH
ANA TITER: 1:80 {titer} — ABNORMAL HIGH
ANA Titer 1: 1:80 {titer} — ABNORMAL HIGH

## 2022-05-30 LAB — IRON,TIBC AND FERRITIN PANEL
%SAT: 25 % (calc) (ref 16–45)
Ferritin: 37 ng/mL (ref 16–154)
Iron: 96 ug/dL (ref 40–190)
TIBC: 389 mcg/dL (calc) (ref 250–450)

## 2022-05-30 LAB — ANA: Anti Nuclear Antibody (ANA): POSITIVE — AB

## 2022-05-31 NOTE — Progress Notes (Signed)
Pt seen results Via my chart  

## 2022-06-05 DIAGNOSIS — E038 Other specified hypothyroidism: Secondary | ICD-10-CM | POA: Diagnosis not present

## 2022-06-05 DIAGNOSIS — R5383 Other fatigue: Secondary | ICD-10-CM | POA: Diagnosis not present

## 2022-06-05 DIAGNOSIS — R790 Abnormal level of blood mineral: Secondary | ICD-10-CM | POA: Diagnosis not present

## 2022-06-22 DIAGNOSIS — Z03818 Encounter for observation for suspected exposure to other biological agents ruled out: Secondary | ICD-10-CM | POA: Diagnosis not present

## 2022-06-22 DIAGNOSIS — Z1152 Encounter for screening for COVID-19: Secondary | ICD-10-CM | POA: Diagnosis not present

## 2022-06-22 DIAGNOSIS — J029 Acute pharyngitis, unspecified: Secondary | ICD-10-CM | POA: Diagnosis not present

## 2022-06-24 ENCOUNTER — Other Ambulatory Visit (INDEPENDENT_AMBULATORY_CARE_PROVIDER_SITE_OTHER): Payer: BC Managed Care – PPO

## 2022-06-24 ENCOUNTER — Encounter: Payer: Self-pay | Admitting: Physician Assistant

## 2022-06-24 ENCOUNTER — Ambulatory Visit: Payer: BC Managed Care – PPO | Admitting: Physician Assistant

## 2022-06-24 VITALS — BP 96/64 | HR 76 | Ht 66.0 in | Wt 141.0 lb

## 2022-06-24 DIAGNOSIS — R5383 Other fatigue: Secondary | ICD-10-CM

## 2022-06-24 DIAGNOSIS — R110 Nausea: Secondary | ICD-10-CM

## 2022-06-24 DIAGNOSIS — R1011 Right upper quadrant pain: Secondary | ICD-10-CM | POA: Diagnosis not present

## 2022-06-24 LAB — C-REACTIVE PROTEIN: CRP: <1 mg/dL (ref 0.5–20.0)

## 2022-06-24 LAB — SEDIMENTATION RATE: Sed Rate: 6 mm/hr (ref 0–20)

## 2022-06-24 NOTE — Patient Instructions (Signed)
If you are age 37 or younger, your body mass index should be between 19-25. Your Body mass index is 22.76 kg/m. If this is out of the aformentioned range listed, please consider follow up with your Primary Care Provider.  ________________________________________________________  The [place] [facility] providers would like to encourage you to use [facility] to communicate with providers for non-urgent requests or questions.  Due to long hold times on the telephone, sending your provider a message by [facility] may be a faster and more efficient way to get a response.  Please allow 48 business hours for a response.  Please remember that this is for non-urgent requests.  _______________________________________________________   Your provider has requested that you go to the basement level for lab work before leaving today. Press "B" on the elevator. The lab is located at the first door on the left as you exit the elevator.  You have been scheduled for a HIDA scan at [facility] (1st floor) on 07/11/2022. Please arrive 15 minutes prior to your scheduled appointment at  7:30 am. Make certain not to have anything to eat or drink at least 6 hours prior to your test. Should this appointment date or time not work well for you, please call radiology scheduling at [phone].  _____________________________________________________________________ hepatobiliary (HIDA) scan is an imaging procedure used to diagnose problems in the liver, gallbladder and bile ducts. In the HIDA scan, a radioactive chemical or tracer is injected into a vein in your arm. The tracer is handled by the liver like bile. Bile is a fluid produced and excreted by your liver that helps your digestive system break down fats in the foods you eat. Bile is stored in your gallbladder and the gallbladder releases the bile when you eat a meal. A special nuclear medicine scanner (gamma camera) tracks the flow of the tracer from your liver into your  gallbladder and small intestine.  During your HIDA scan  You'll be asked to change into a hospital gown before your HIDA scan begins. Your health care team will position you on a table, usually on your back. The radioactive tracer is then injected into a vein in your arm.The tracer travels through your bloodstream to your liver, where it's taken up by the bile-producing cells. The radioactive tracer travels with the bile from your liver into your gallbladder and through your bile ducts to your small intestine.You may feel some pressure while the radioactive tracer is injected into your vein. As you lie on the table, a special gamma camera is positioned over your abdomen taking pictures of the tracer as it moves through your body. The gamma camera takes pictures continually for about an hour. You'll need to keep still during the HIDA scan. This can become uncomfortable, but you may find that you can lessen the discomfort by taking deep breaths and thinking about other things. Tell your health care team if you're uncomfortable. The radiologist will watch on a computer the progress of the radioactive tracer through your body. The HIDA scan may be stopped when the radioactive tracer is seen in the gallbladder and enters your small intestine. This typically takes about an hour. In some cases extra imaging will be performed if original images aren't satisfactory, if morphine is given to help visualize the gallbladder or if the medication CCK is given to look at the contraction of the gallbladder. This test typically takes 2 hours to complete. ________________________________________________________________________  Follow up pending the results of your Hida scan and labs  Thank you for entrusting me with your care and choosing [facility].  Amy Esterwood, PA-C

## 2022-06-24 NOTE — Progress Notes (Signed)
Subjective:    Patient ID: Lauren Small, female    DOB: 12-22-1986, 36 y.o.   MRN: [id number]  HPI Lauren Small is a pleasant 36 year old white female, new to GI today referred by Dr. Beverely Low for evaluation of recurring episodes of right-sided abdominal pain nausea and vomiting. She relates that she has had some prior GI evaluation.  She was seen by [facility] in August 2022, does not recall who she saw but did not have any procedures or work-up and was advised to take probiotics. In 2016 she had undergone colonoscopy and EGD while living in [place] due to complaints of abdominal pain and blood in her stool.  She says these exams were negative other than hemorrhoids.  She had upper abdominal ultrasound December 2022 which did not show any gallstones or gallbladder wall thickening, CBD 2 to 3 mm and normal-appearing liver. CT of the abdomen and pelvis without contrast May 2023 shows a tiny hepatic density in the right lobe too small to characterize, no ductal dilation, otherwise normal-appearing liver, normal pancreas, large amount of retained stool. Most recent labs 05/27/2022 with T. bili 2.7 direct bili 0.4 transaminases within normal limits Reviewing her labs-she has had a mildly elevated bilirubin in the past, T. bili was 2.25 November 2021 with otherwise normal LFTs, and May 2021 T. bili 1.3 with otherwise normal LFTs.  Patient says that she is always has a sensitive stomach but her current episodes started around December 2022 at which time she had multiple episodes that month.  She says that these episodes usually start with fatigue for a day or 2 followed by noticing that her eyes look a little bit yellow, then she develops right-sided abdominal pain nausea and vomiting, has had chills but no fever, usually has some mild diarrhea.  The right-sided pain will be fairly constant and like a deep ache or pain and the symptoms will last for a period of days, gradually resolve and she will feel fine  in the interim.  She had been evaluated by [facility] in February 2023, has been on an anticandida diet and completed a course of treatment for candidiasis which she took for about 3 months.  She has not noticed any improvement in her symptoms.  She also did food allergy testing through them and has multiple different foods that she is sensitive to including multiple grains. She relates that she has had long-term issues with constipation, had tried Linzess in the past but says that this caused significant diarrhea and abdominal pain.  Currently she is taking a magnesium supplement as needed for constipation. She says she has been eating very bland over the past couple of months, has only had an episode every 6 to 8 weeks over the past few months and the last one was about 3 weeks ago.  Family history is negative for celiac disease, mom with diverticular disease and status postcholecystectomy.  Grandmother with rheumatoid arthritis, sister with type 1 diabetes.  She has also had a persistently positive ANA-1 year ago titer was 1-3 20, 1 month ago titer 1-80 nuclear speckled Recent sed rate 5 Recent iron studies normal CBC normal with exception of WBC at 3.9  She has been referred to rheumatology but cannot be seen there until December.  Review of Systems. Pertinent positive and negative review of systems were noted in the above HPI section.  All other review of systems was otherwise negative.   Outpatient Encounter Medications as of 06/24/2022  Medication Sig  albuterol (VENTOLIN HFA) 108 (90 Base) MCG/ACT inhaler Inhale 2 puffs into the lungs every 6 (six) hours as needed for wheezing or shortness of breath.   cetirizine (ZYRTEC) 10 MG tablet Take 10 mg by mouth daily.    fluticasone (FLONASE) 50 MCG/ACT nasal spray Place 2 sprays into both nostrils daily.    NON FORMULARY Take 25 mg by mouth in the morning and at bedtime. Testosterone Oral Troche   spironolactone (ALDACTONE)  50 MG tablet Take 1 tablet by mouth daily.   [DISCONTINUED] cetirizine (ZYRTEC) 10 MG chewable tablet    [DISCONTINUED] fluticasone (FLONASE) 50 MCG/ACT nasal spray    [DISCONTINUED] nystatin cream (MYCOSTATIN)    No facility-administered encounter medications on file as of 06/24/2022.   Allergies  Allergen Reactions   Erythromycin Base Rash        Azithromycin Rash   Ciprofloxacin Rash   Erythromycin Rash   Patient Active Problem List   Diagnosis Date Noted   Cervical radiculopathy 02/23/2021   Physical exam 04/24/2020   Pregnant 09/15/2019   Social History   Socioeconomic History   Marital status: Married    Spouse name: Not on file   Number of children: Not on file   Years of education: Not on file   Highest education level: Not on file  Occupational History   Not on file  Tobacco Use   Smoking status: Never   Smokeless tobacco: Never  Vaping Use   Vaping Use: Never used  Substance and Sexual Activity   Alcohol use: Never   Drug use: Never   Sexual activity: Yes  Other Topics Concern   Not on file  Social History Narrative   Not on file   Social Determinants of Health   Financial Resource Strain: Low Risk  (09/02/2019)   Overall Financial Resource Strain (CARDIA)    Difficulty of Paying Living Expenses: Not hard at all  Food Insecurity: No Food Insecurity (09/02/2019)   Hunger Vital Sign    Worried About Running Out of Food in the Last Year: Never true    Ran Out of Food in the Last Year: Never true  Transportation Needs: No Transportation Needs (09/02/2019)   PRAPARE - Administrator, Civil Service (Medical): No    Lack of Transportation (Non-Medical): No  Physical Activity: Not on file  Stress: Not on file  Social Connections: Not on file  Intimate Partner Violence: Not on file    Lauren Small's family history includes Asthma in her sister and sister; Diabetes in her sister; Early death in her father and maternal grandmother; Healthy in her  daughter; Heart attack in her maternal grandfather; Hyperlipidemia in her maternal grandfather; Hypertension in her maternal grandfather; Thyroid disease in her mother.      Objective:    Vitals:   06/24/22 0841  BP: 96/64  Pulse: 76    Physical Exam Well-developed well-nourished  WF  in no acute distress.  Height, Weight,141 BMI 22.76  HEENT; nontraumatic normocephalic, EOMI, PE R LA, sclera anicteric. Oropharynx; not examined today Neck; supple, no JVD Cardiovascular; regular rate and rhythm with S1-S2, no murmur rub or gallop Pulmonary; Clear bilaterally Abdomen; soft, nontender, nondistended, no palpable mass or hepatosplenomegaly, bowel sounds are active Rectal; not done today Skin; benign exam, no jaundice rash or appreciable lesions Extremities; no clubbing cyanosis or edema skin warm and dry Neuro/Psych; alert and oriented x4, grossly nonfocal mood and affect appropriate        Assessment & Plan:   #  66 36 year old white female with 7 to 67-month history of recurring episodes of fatigue, mild scleral icterus, followed by right upper abdominal pain nausea vomiting, chills mild diarrhea with episodes lasting for several days then gradually resolving.  She had several episodes per month at onset and is now having an episode every 6 weeks or so, the last one was 3 weeks ago.  She feels fine in between these episodes with no ongoing fatigue or other GI symptoms.  Recent treatment for candidiasis systemic per integrative health, completed a course of nystatin x3 months  Etiology of her symptoms is not clear, negative upper abdominal ultrasound December 2022 and negative noncontrasted CT May 2023 other than constipation  I am suspicious of an autoimmune disorder. She has not had any elevated LFTs other than isolated elevated T. bili which is most suggestive of Gilbert's syndrome, with mild chronic elevation of T. bili, and increase in T. bili with any other acute infection or  illness.  Rule out biliary dyskinesia  #2 constipation mild chronic-currently using a magnesium supplement as needed which is effective  Plan; we will schedule for CCK HIDA scan Check CRP, TTG, IgA, anti-double-stranded DNA. We may need to obtain imaging and/or labs during 1 of these episodes. Patient has been referred to rheumatology, appointment scheduled for December 2023 Patient will be established with Dr. Adela Lank, further recommendations pending results of above.  Johnanthony Wilden S Lauren Emile PA-C 06/24/2022   Cc: Sheliah Hatch, MD

## 2022-06-25 NOTE — Progress Notes (Signed)
Agree with assessment and plan as outlined.  

## 2022-06-27 LAB — TISSUE TRANSGLUTAMINASE, IGA: (tTG) Ab, IgA: <1 U/mL

## 2022-06-27 LAB — ANTI-DNA ANTIBODY, DOUBLE-STRANDED: ds DNA Ab: 2 IU/mL

## 2022-06-27 LAB — IGA: Immunoglobulin A: 167 mg/dL (ref 47–310)

## 2022-07-11 ENCOUNTER — Encounter (HOSPITAL_COMMUNITY)
Admission: RE | Admit: 2022-07-11 | Discharge: 2022-07-11 | Disposition: A | Discharge status: Home / Self Care | Payer: BC Managed Care – PPO | Source: Ambulatory Visit | Attending: Physician Assistant | Admitting: Physician Assistant

## 2022-07-11 DIAGNOSIS — R110 Nausea: Secondary | ICD-10-CM | POA: Insufficient documentation

## 2022-07-11 DIAGNOSIS — R5383 Other fatigue: Secondary | ICD-10-CM | POA: Diagnosis not present

## 2022-07-11 DIAGNOSIS — R1011 Right upper quadrant pain: Secondary | ICD-10-CM | POA: Insufficient documentation

## 2022-07-11 MED ORDER — TECHNETIUM TC 99M MEBROFENIN IV KIT
7.8000 | PACK | Freq: Once | INTRAVENOUS | Status: AC
  Administered 2022-07-11: 7.8 via INTRAVENOUS

## 2022-07-15 DIAGNOSIS — R7989 Other specified abnormal findings of blood chemistry: Secondary | ICD-10-CM | POA: Diagnosis not present

## 2022-07-15 DIAGNOSIS — R1011 Right upper quadrant pain: Secondary | ICD-10-CM | POA: Diagnosis not present

## 2022-07-15 DIAGNOSIS — E538 Deficiency of other specified B group vitamins: Secondary | ICD-10-CM | POA: Diagnosis not present

## 2022-07-15 DIAGNOSIS — R790 Abnormal level of blood mineral: Secondary | ICD-10-CM | POA: Diagnosis not present

## 2022-07-19 DIAGNOSIS — L237 Allergic contact dermatitis due to plants, except food: Secondary | ICD-10-CM | POA: Diagnosis not present

## 2022-08-07 ENCOUNTER — Encounter: Payer: Self-pay | Admitting: Gastroenterology

## 2022-08-07 ENCOUNTER — Ambulatory Visit: Payer: BC Managed Care – PPO | Admitting: Gastroenterology

## 2022-08-07 VITALS — BP 100/72 | HR 59 | Ht 66.0 in | Wt 142.0 lb

## 2022-08-07 DIAGNOSIS — R1011 Right upper quadrant pain: Secondary | ICD-10-CM | POA: Diagnosis not present

## 2022-08-07 MED ORDER — OMEPRAZOLE 40 MG PO CPDR: 40.0000 mg | DELAYED_RELEASE_CAPSULE | Freq: Every day | ORAL | 1 refills | Status: DC

## 2022-08-07 NOTE — Patient Instructions (Signed)
_______________________________________________________  If you are age 37 or older, your body mass index should be between 23-30. Your Body mass index is 22.92 kg/m. If this is out of the aforementioned range listed, please consider follow up with your Primary Care Provider.  If you are age 6 or younger, your body mass index should be between 19-25. Your Body mass index is 22.92 kg/m. If this is out of the aformentioned range listed, please consider follow up with your Primary Care Provider.   ________________________________________________________  The [place] [facility] providers would like to encourage you to use [facility] to communicate with providers for non-urgent requests or questions.  Due to long hold times on the telephone, sending your provider a message by [facility] may be a faster and more efficient way to get a response.  Please allow 48 business hours for a response.  Please remember that this is for non-urgent requests.  _______________________________________________________  We have sent the following medications to your pharmacy for you to pick up at your convenience:  Omeprazole  You have been scheduled for an endoscopy. Please follow written instructions given to you at your visit today. If you use inhalers (even only as needed), please bring them with you on the day of your procedure.

## 2022-08-07 NOTE — Progress Notes (Signed)
HPI :  36 year old female here for a follow-up visit for abdominal pain.  See intake visit from Mike Gip on July 3 for full details.  Recall she has been having intermittent pain in her right upper quadrant for some time now.  Initially had mild intermittent discomfort in recent years, however since December symptoms have become much more frequent and worse.  She had several episodes of severe right upper quadrant pain that radiated to her right back, associate with nausea vomiting.  She also had some loose stools with this.  She had a right upper quadrant ultrasound in December which showed no gallstones.  She has had some change in diet in recent months which she states has helped somewhat but she mainly remains symptomatic.  She is currently having a few episodes per week where she will have pain in her right upper quadrant that radiates to her right mid back and right shoulder.  She states she has seen her chiropractor for this as she thought it might have been a back problem but has not gotten better with therapy.  She had a CT renal study in May which showed no clear cause for her symptoms.  She at baseline has some constipation.  She has been taking magnesium supplement routinely to help with her constipation, she has been having a few stools per day, sometimes on the looser side.  This has not provided any benefit to her pain.  Sometimes her pain is postprandial, sometimes it is not.  She has no positional component to this.  She is not longer having any nausea or vomiting associated with this.  She is not having blood in her stools.  No routine NSAID use.  She does have a history of GERD in the past but has not been bothering her lately.  She does have some dysphagia to pills at times.  No weight loss.  She remotely had an EGD and colonoscopy in 2016 while in [place] for work-up for blood in her stools.  She states she was told she had hemorrhoids in the setting of constipation at that  time. She has not had a trial of PPI for the symptoms.  Since her last visit she had a HIDA scan which was also negative.  Of note she has had elevated bilirubin in the past which has been indirect with normal ALT, AST, and alk phos.  Her CT and ultrasound showed no abnormalities of her biliary tree or liver.  Family history is negative for celiac disease, mom with diverticular disease and status postcholecystectomy.  Grandmother with rheumatoid arthritis, sister with type 1 diabetes. She has had a positive ANA in the past.  She had normal lab work without anemia.  Iron studies normal.  Inflammatory markers normal.  She tested negative for celiac disease.  HIDA scan 07/11/22 - normal  Celiac labs negative     Past Medical History:  Diagnosis Date   Allergy    Asthma    Blood in stool    GERD (gastroesophageal reflux disease)    History of chicken pox      Past Surgical History:  Procedure Laterality Date   urethra polyp     WISDOM TOOTH EXTRACTION     Family History  Problem Relation Age of Onset   Thyroid disease Mother    Early death Father    Asthma Sister    Diabetes Sister        type 1   Healthy Daughter  Early death Maternal Grandmother    Heart attack Maternal Grandfather    Hyperlipidemia Maternal Grandfather    Hypertension Maternal Grandfather    Asthma Sister    Social History   Tobacco Use   Smoking status: Never   Smokeless tobacco: Never  Vaping Use   Vaping Use: Never used  Substance Use Topics   Alcohol use: Never   Drug use: Never   Current Outpatient Medications  Medication Sig Dispense Refill   albuterol (VENTOLIN HFA) 108 (90 Base) MCG/ACT inhaler Inhale 2 puffs into the lungs every 6 (six) hours as needed for wheezing or shortness of breath. 18 g 3   cetirizine (ZYRTEC) 10 MG tablet Take 10 mg by mouth daily.      fluticasone (FLONASE) 50 MCG/ACT nasal spray Place 2 sprays into both nostrils daily.      NON FORMULARY Take 25 mg by  mouth in the morning and at bedtime. Testosterone Oral Troche     NP THYROID 30 MG tablet Take 30 mg by mouth every morning.     omeprazole (PRILOSEC) 40 MG capsule Take 1 capsule (40 mg total) by mouth daily. 30 capsule 1   spironolactone (ALDACTONE) 50 MG tablet Take 1 tablet by mouth daily.     No current facility-administered medications for this visit.   Allergies  Allergen Reactions   Erythromycin Base Rash        Azithromycin Rash   Ciprofloxacin Rash   Erythromycin Rash     Review of Systems: All systems reviewed and negative except where noted in HPI.    NM Hepato W/EjeCT Fract  Result Date: 07/11/2022 CLINICAL DATA:  Right upper quadrant abdominal pain. EXAM: NUCLEAR MEDICINE HEPATOBILIARY IMAGING WITH GALLBLADDER EF TECHNIQUE: Sequential images of the abdomen were obtained out to 60 minutes following intravenous administration of radiopharmaceutical. After oral ingestion of Ensure, gallbladder ejection fraction was determined. At 60 min, normal ejection fraction is greater than 33%. RADIOPHARMACEUTICALS:  7.8 mCi Tc-54m  Choletec IV COMPARISON:  CT May 16, 2022 FINDINGS: Prompt uptake and biliary excretion of activity by the liver is seen. Gallbladder activity is visualized, consistent with patency of cystic duct. Biliary activity passes into small bowel, consistent with patent common bile duct. Calculated gallbladder ejection fraction is 57%. (Normal gallbladder ejection fraction with Ensure is greater than 33%.) IMPRESSION: 1.  Patent cystic and common bile ducts. 2.  Normal gallbladder ejection fraction. Electronically Signed   By: Dahlia Bailiff M.D.   On: 07/11/2022 11:18    Physical Exam: BP 100/72   Pulse (!) 59   Ht $R'5\' 6"'Ev$  (1.676 m)   Wt 142 lb (64.4 kg)   LMP 08/07/2022   BMI 22.92 kg/m  Constitutional: Pleasant,well-developed, female in no acute distress. Abdominal: Soft, nondistended, mild RUQ TTP , no massess Extremities: no edema Neurological: Alert and  oriented to person place and time. Skin: Skin is warm and dry. No rashes noted. Psychiatric: Normal mood and affect. Behavior is normal.   ASSESSMENT: 36 y.o. female here for assessment of the following  1. RUQ pain   2. Rosanna Randy syndrome    As above, intermittent episodes of right upper quadrant pain that radiated to her right mid back and into the right shoulder.  Sometimes occurs in a postprandial setting.  Symptoms are certainly concerning for biliary colic however her ultrasound and HIDA scan are both normal.  CT scan otherwise looked okay and no improvement with chiropractor treatments.  We discussed full differential diagnosis.  She has  not had a trial of PPI I think that may be reasonable in case any component of PUD or gastritis.  At this point time differential includes upper tract causes versus biliary colic versus musculoskeletal.  I am recommending EGD to clear her upper tract.  I discussed risk benefits of endoscopy and anesthesia with her and she wants to proceed.  We will also try her on omeprazole 40 mg once daily for a month to see if that may help prevent episodes.  If we find pathology on her EGD we will address that, and await course on PPI.  However if EGD is negative and she has no response to PPI, then would consider referral to surgery for consideration for cholecystectomy, understanding that we cannot guarantee that would resolve her symptoms.  One could also consider referral to sports med for possible trigger point injection in the area although her symptoms are not typical for musculoskeletal.  She may consider empiric cholecystectomy if her work-up with [place] is otherwise unremarkable.  I would not recommend empiric cholecystectomy without EGD first.  She is in agreement with this.  Otherwise, reviewed her labs with her, she has Gilbert's causing indirect bilirubinemia, reassured her on benign nature of this.  PLAN: - schedule for EGD at the [facility] - start trial of omeprazole  $RemoveBef'40mg'[id number]$  / day for 30 days - reassured about Gilbert's - may need to consider empiric cholecystectomy if symptoms persist despite trial of PPI and EGD negative. Alternatively other possible etiology is musculoskeletal although symptoms a bit atypical for that as well  Jolly Mango, MD [facility]

## 2022-08-13 IMAGING — CR DG CHEST 2V
2 series · 2 of 2 positions shown · non-contrast
Comparison: None.

CLINICAL DATA: Chest tightness, shortness of breath, lightheaded,
nausea

EXAM:
CHEST - 2 VIEW

[chest pa]
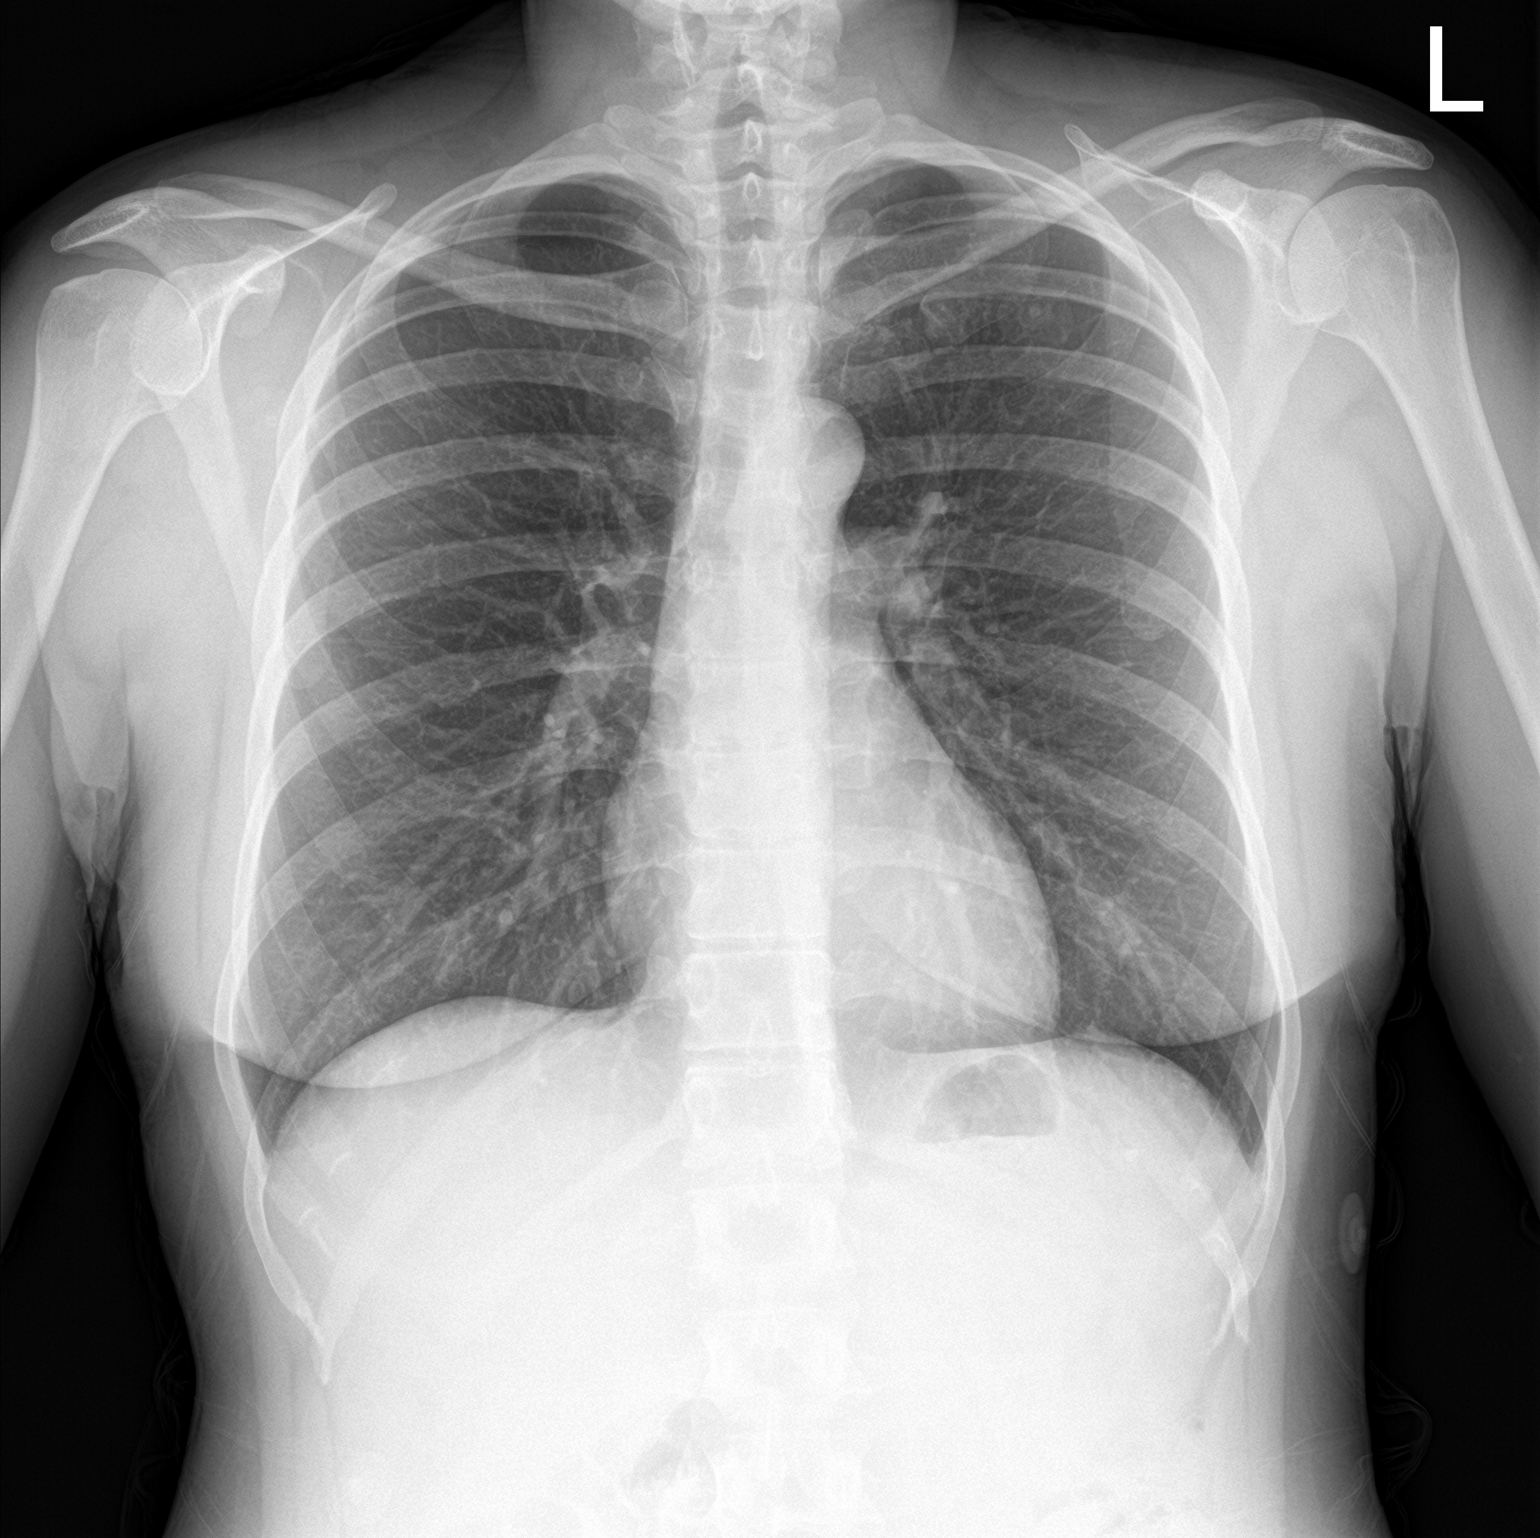

[chest lat]
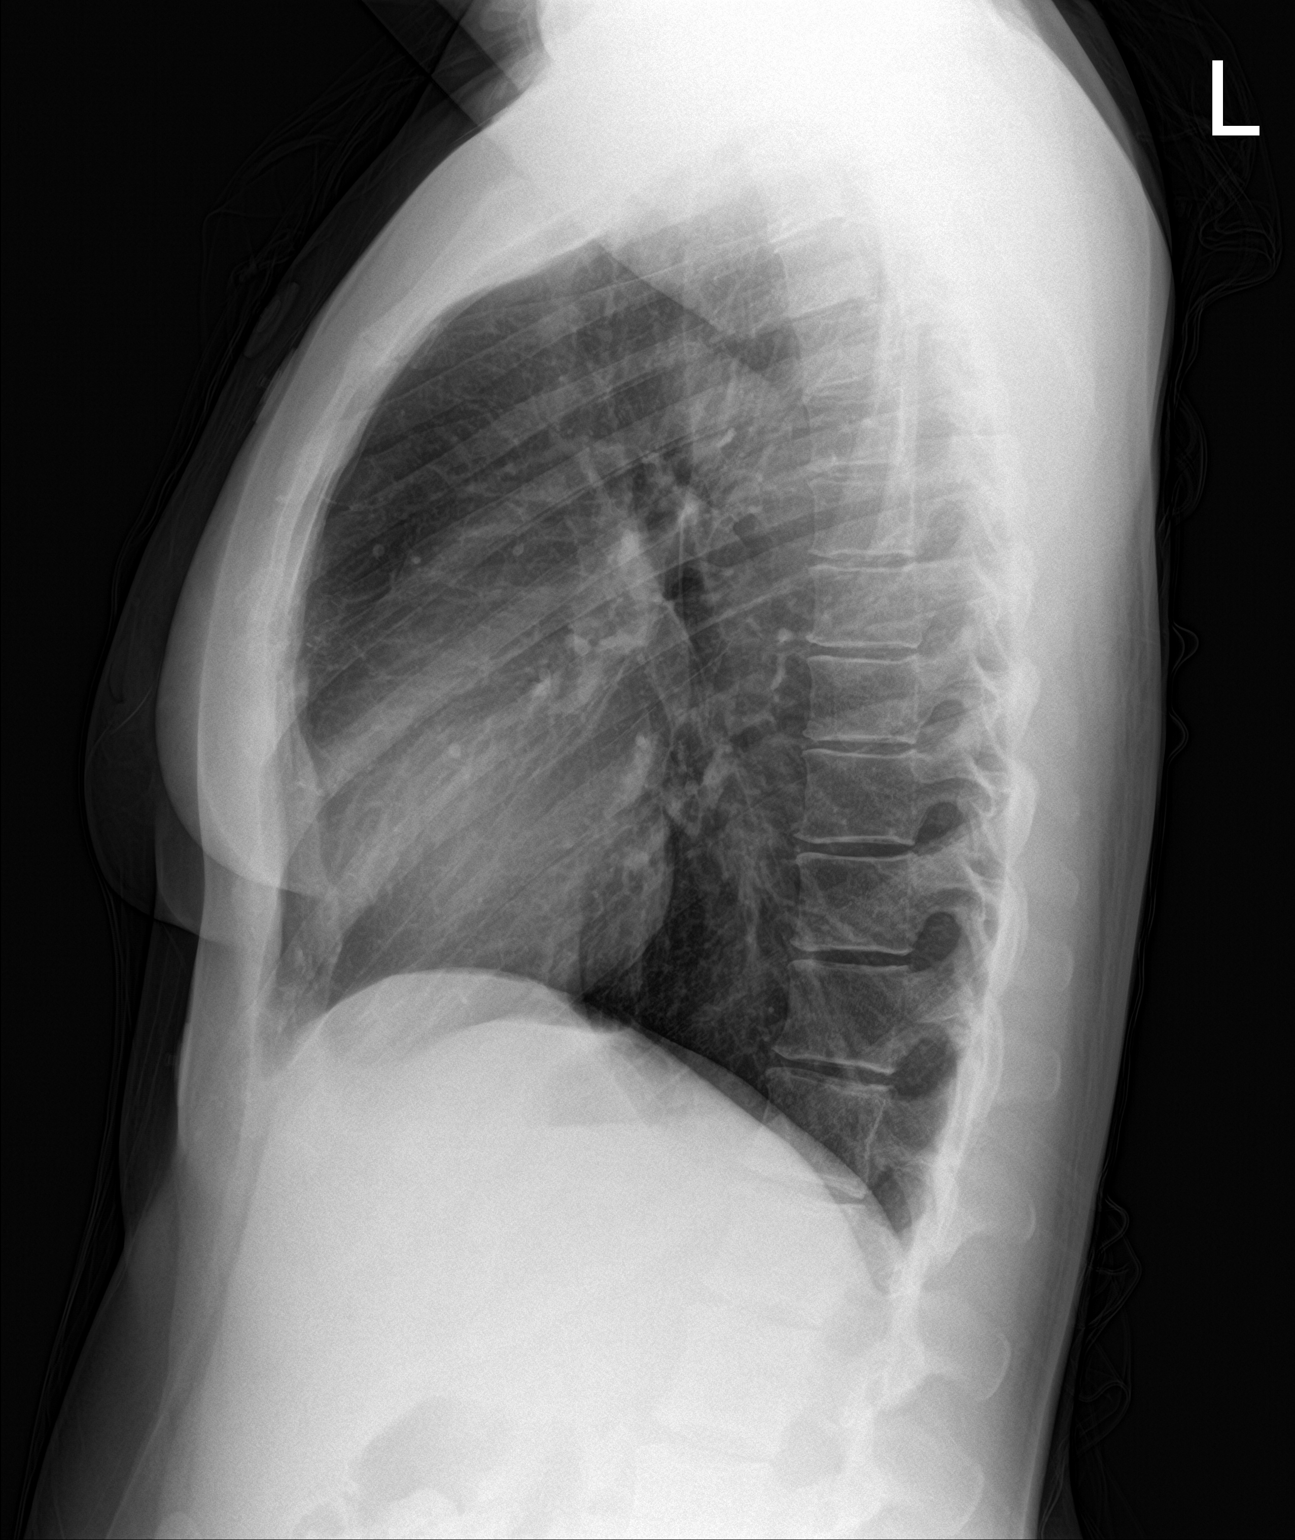

[2 of 2 positions shown; findings below may reference images not displayed]

FINDINGS: The heart size and mediastinal contours are within normal limits.

No focal consolidation. No pulmonary edema. No pleural effusion. No
pneumothorax.

No acute osseous abnormality.
IMPRESSION: No active cardiopulmonary disease.

## 2022-08-21 ENCOUNTER — Ambulatory Visit (AMBULATORY_SURGERY_CENTER): Payer: BC Managed Care – PPO | Admitting: Gastroenterology

## 2022-08-21 ENCOUNTER — Encounter: Payer: Self-pay | Admitting: Gastroenterology

## 2022-08-21 VITALS — BP 101/61 | HR 62 | Temp 97.5°F | Resp 12 | Ht 66.0 in | Wt 141.0 lb

## 2022-08-21 DIAGNOSIS — R933 Abnormal findings on diagnostic imaging of other parts of digestive tract: Secondary | ICD-10-CM | POA: Diagnosis not present

## 2022-08-21 DIAGNOSIS — K219 Gastro-esophageal reflux disease without esophagitis: Secondary | ICD-10-CM | POA: Diagnosis not present

## 2022-08-21 DIAGNOSIS — K319 Disease of stomach and duodenum, unspecified: Secondary | ICD-10-CM | POA: Diagnosis not present

## 2022-08-21 DIAGNOSIS — R1011 Right upper quadrant pain: Secondary | ICD-10-CM | POA: Diagnosis not present

## 2022-08-21 MED ORDER — SODIUM CHLORIDE 0.9 % IV SOLN: 500.0000 mL | Freq: Once | INTRAVENOUS | Status: DC

## 2022-08-21 NOTE — Progress Notes (Signed)
History and Physical Interval Note: see note from 08/07/22 for details. Ongoing intermittent RUQ pain. RUQ [place] and HIDA negative. CT negative. Trial of omeprazole past 2 weeks has not appeared to help. EGD done to clear upper tract prior to consideration for empiric cholecystectomy. Have discussed risks / benefits she wishes to proceed. Otherwise feels well today.  08/21/2022 4:23 PM  Lauren Small  has presented today for endoscopic procedure(s), with the diagnosis of  Encounter Diagnosis  Name Primary?   RUQ pain Yes  .  The various methods of evaluation and treatment have been discussed with the patient and/or family. After consideration of risks, benefits and other options for treatment, the patient has consented to  the endoscopic procedure(s).   The patient's history has been reviewed, patient examined, no change in status, stable for surgery.  I have reviewed the patient's chart and labs.  Questions were answered to the patient's satisfaction.    Harlin Rain, MD [facility]

## 2022-08-21 NOTE — Progress Notes (Signed)
Report to PACU, RN, vss, BBS= Clear.  

## 2022-08-21 NOTE — Progress Notes (Signed)
Called to room to assist during endoscopic procedure.  Patient ID and intended procedure confirmed with present staff. Received instructions for my participation in the procedure from the performing physician.  

## 2022-08-21 NOTE — Op Note (Signed)
[facility] Patient Name: Lauren Small Procedure Date: 08/21/2022 4:24 PM MRN: [id number] Endoscopist: Viviann Spare P. Adela Lank , MD Age: 36 Referring MD:  Date of Birth: 03-Oct-1986 Gender: Female Account #: [id number] Procedure:                Upper GI endoscopy Indications:              Intermittent right upper quadrant pain that                            radiates to shoulder / back, somewhat postprandial,                            RUQ [place] and HIDA both negative, CT scan negative.                            Trial of omeprazole for a few weeks has not helped.                            EGD to clear upper tract Medicines:                Monitored Anesthesia Care Procedure:                Pre-Anesthesia Assessment:                           - Prior to the procedure, a History and Physical                            was performed, and patient medications and                            allergies were reviewed. The patient's tolerance of                            previous anesthesia was also reviewed. The risks                            and benefits of the procedure and the sedation                            options and risks were discussed with the patient.                            All questions were answered, and informed consent                            was obtained. Prior Anticoagulants: The patient has                            taken no previous anticoagulant or antiplatelet                            agents. ASA Grade Assessment: II - A patient with  mild systemic disease. After reviewing the risks                            and benefits, the patient was deemed in                            satisfactory condition to undergo the procedure.                           After obtaining informed consent, the endoscope was                            passed under direct vision. Throughout the                            procedure, the patient's  blood pressure, pulse, and                            oxygen saturations were monitored continuously. The                            Endoscope was introduced through the mouth, and                            advanced to the second part of duodenum. The upper                            GI endoscopy was accomplished without difficulty.                            The patient tolerated the procedure well. Scope In: Scope Out: Findings:                 Esophagogastric landmarks were identified: the                            Z-line was found at 37 cm, the gastroesophageal                            junction was found at 37 cm and the upper extent of                            the gastric folds was found at 37 cm from the                            incisors.                           The exam of the esophagus was otherwise normal.                           The entire examined stomach was normal. Biopsies  were taken with a cold forceps for Helicobacter                            pylori testing.                           The examined duodenum was normal. Complications:            No immediate complications. Estimated blood loss:                            Minimal. Estimated Blood Loss:     Estimated blood loss was minimal. Impression:               - Esophagogastric landmarks identified.                           - Normal esophagus otherwise.                           - Normal stomach. Biopsied to rule out H pylori.                           - Normal examined duodenum.                           No overt cause of symptoms on this exam. Will await                            biopsy results.                           DDx includes biliary colic despite RUQ [place] and HIDA                            scan, vs. musculoskeletal etiology. Recommendation:           - Patient has a contact number available for                            emergencies. The signs and symptoms of  potential                            delayed complications were discussed with the                            patient. Return to normal activities tomorrow.                            Written discharge instructions were provided to the                            patient.                           - Resume previous diet.                           -  Continue present medications.                           - Await pathology results.                           - Will discuss options with patient. May consider                            general surgery referral to evaluate for possible                            empiric cholecystectomy vs. trial of regimen for                            neuropathic / musculoskeletal pain, or sports                            medicine evaluation Viviann Spare P. Adela Lank, MD 08/21/2022 4:44:02 PM This report has been signed electronically.

## 2022-08-21 NOTE — Patient Instructions (Signed)
Please read handouts provided. Continue present medications. Await pathology results.   YOU HAD AN ENDOSCOPIC PROCEDURE TODAY AT THE [facility]:   Refer to the procedure report that was given to you for any specific questions about what was found during the examination.  If the procedure report does not answer your questions, please call your gastroenterologist to clarify.  If you requested that your care partner not be given the details of your procedure findings, then the procedure report has been included in a sealed envelope for you to review at your convenience later.  YOU SHOULD EXPECT: Some feelings of bloating in the abdomen. Passage of more gas than usual.  Walking can help get rid of the air that was put into your GI tract during the procedure and reduce the bloating. If you had a lower endoscopy (such as a colonoscopy or flexible sigmoidoscopy) you may notice spotting of blood in your stool or on the toilet paper. If you underwent a bowel prep for your procedure, you may not have a normal bowel movement for a few days.  Please Note:  You might notice some irritation and congestion in your nose or some drainage.  This is from the oxygen used during your procedure.  There is no need for concern and it should clear up in a day or so.  SYMPTOMS TO REPORT IMMEDIATELY:   Following upper endoscopy (EGD)  Vomiting of blood or coffee ground material  New chest pain or pain under the shoulder blades  Painful or persistently difficult swallowing  New shortness of breath  Fever of 100F or higher  Black, tarry-looking stools  For urgent or emergent issues, a gastroenterologist can be reached at any hour by calling [phone]. Do not use MyChart messaging for urgent concerns.    DIET:  We do recommend a small meal at first, but then you may proceed to your regular diet.  Drink plenty of fluids but you should avoid alcoholic beverages for 24 hours.  ACTIVITY:  You should  plan to take it easy for the rest of today and you should NOT DRIVE or use heavy machinery until tomorrow (because of the sedation medicines used during the test).    FOLLOW UP: Our staff will call the number listed on your records the next business day following your procedure.  We will call around 7:15- 8:00 am to check on you and address any questions or concerns that you may have regarding the information given to you following your procedure. If we do not reach you, we will leave a message.  If you develop any symptoms (ie: fever, flu-like symptoms, shortness of breath, cough etc.) before then, please call [phone].  If you test positive for Covid 19 in the 2 weeks post procedure, please call and report this information to us.    If any biopsies were taken you will be contacted by phone or by letter within the next 1-3 weeks.  Please call us at [phone] if you have not heard about the biopsies in 3 weeks.    SIGNATURES/CONFIDENTIALITY: You and/or your care partner have signed paperwork which will be entered into your electronic medical record.  These signatures attest to the fact that that the information above on your After Visit Summary has been reviewed and is understood.  Full responsibility of the confidentiality of this discharge information lies with you and/or your care-partner.  

## 2022-08-22 ENCOUNTER — Telehealth: Payer: Self-pay | Admitting: *Deleted

## 2022-08-22 ENCOUNTER — Telehealth: Payer: Self-pay

## 2022-08-22 NOTE — Telephone Encounter (Signed)
-----   Message from Benancio Deeds, MD sent at 08/21/2022  4:55 PM EDT ----- Regarding: surgery referral Everlina Gotts can you refer this patient to [facility] general surgery for RUQ pain, consideration for empiric cholecystectomy, given her negative workup to date otherwise? Thanks  Dr. Mervyn Skeeters

## 2022-08-22 NOTE — Telephone Encounter (Signed)
No answer for post procedure followup call. Left VM. ?

## 2022-08-22 NOTE — Telephone Encounter (Signed)
Referral, records, pt's demographic, and insurance information have been faxed to [facility] at [phone]. MyChart message sent to patient with referral information.

## 2022-08-22 NOTE — Telephone Encounter (Signed)
Patient reviewed MyChart message. Last read by Lysle Morales at  9:19 AM on 08/22/2022.

## 2022-08-23 ENCOUNTER — Ambulatory Visit (INDEPENDENT_AMBULATORY_CARE_PROVIDER_SITE_OTHER): Payer: BC Managed Care – PPO | Admitting: Family Medicine

## 2022-08-23 ENCOUNTER — Encounter: Payer: Self-pay | Admitting: Family Medicine

## 2022-08-23 VITALS — BP 110/72 | HR 65 | Temp 98.1°F | Resp 16 | Ht 66.0 in | Wt 144.4 lb

## 2022-08-23 DIAGNOSIS — Z0000 Encounter for general adult medical examination without abnormal findings: Secondary | ICD-10-CM

## 2022-08-23 DIAGNOSIS — E039 Hypothyroidism, unspecified: Secondary | ICD-10-CM | POA: Insufficient documentation

## 2022-08-23 LAB — CBC WITH DIFFERENTIAL/PLATELET
Basophils Absolute: 0 10*3/uL (ref 0.0–0.1)
Basophils Relative: 0.6 % (ref 0.0–3.0)
Eosinophils Absolute: 0.1 10*3/uL (ref 0.0–0.7)
Eosinophils Relative: 1.7 % (ref 0.0–5.0)
HCT: 39 % (ref 36.0–46.0)
Hemoglobin: 13.2 g/dL (ref 12.0–15.0)
Lymphocytes Relative: 30.5 % (ref 12.0–46.0)
Lymphs Abs: 1.9 10*3/uL (ref 0.7–4.0)
MCHC: 33.9 g/dL (ref 30.0–36.0)
MCV: 92 fl (ref 78.0–100.0)
Monocytes Absolute: 0.4 10*3/uL (ref 0.1–1.0)
Monocytes Relative: 6 % (ref 3.0–12.0)
Neutro Abs: 3.8 10*3/uL (ref 1.4–7.7)
Neutrophils Relative %: 61.2 % (ref 43.0–77.0)
Platelets: 308 10*3/uL (ref 150.0–400.0)
RBC: 4.23 Mil/uL (ref 3.87–5.11)
RDW: 12.7 % (ref 11.5–15.5)
WBC: 6.2 10*3/uL (ref 4.0–10.5)

## 2022-08-23 LAB — BASIC METABOLIC PANEL
BUN: 14 mg/dL (ref 6–23)
CO2: 28 mEq/L (ref 19–32)
Calcium: 9.6 mg/dL (ref 8.4–10.5)
Chloride: 104 mEq/L (ref 96–112)
Creatinine, Ser: 0.81 mg/dL (ref 0.40–1.20)
GFR: 92.67 mL/min (ref >60.00)
Glucose, Bld: 86 mg/dL (ref 70–99)
Potassium: 4.2 mEq/L (ref 3.5–5.1)
Sodium: 138 mEq/L (ref 135–145)

## 2022-08-23 LAB — LIPID PANEL
Cholesterol: 156 mg/dL (ref 0–200)
HDL: 58.9 mg/dL (ref >39.00)
LDL Cholesterol: 85 mg/dL (ref 0–99)
NonHDL: 97.38
Total CHOL/HDL Ratio: 3
Triglycerides: 62 mg/dL (ref 0.0–149.0)
VLDL: 12.4 mg/dL (ref 0.0–40.0)

## 2022-08-23 LAB — HEPATIC FUNCTION PANEL
ALT: 12 U/L (ref 0–35)
AST: 15 U/L (ref 0–37)
Albumin: 4.5 g/dL (ref 3.5–5.2)
Alkaline Phosphatase: 42 U/L (ref 39–117)
Bilirubin, Direct: 0.3 mg/dL (ref 0.0–0.3)
Total Bilirubin: 1.7 mg/dL — ABNORMAL HIGH (ref 0.2–1.2)
Total Protein: 7.2 g/dL (ref 6.0–8.3)

## 2022-08-23 LAB — T4, FREE: Free T4: 1.14 ng/dL (ref 0.60–1.60)

## 2022-08-23 LAB — TSH: TSH: 1.28 u[IU]/mL (ref 0.35–5.50)

## 2022-08-23 LAB — T3, FREE: T3, Free: 3.2 pg/mL (ref 2.3–4.2)

## 2022-08-23 NOTE — Patient Instructions (Signed)
Follow up in 1 year or as needed We'll notify you of your lab results and make any changes if needed Keep up the good work on healthy diet and regular exercise- you look great!! Call with any questions or concerns Happy Labor Day!!! 

## 2022-08-23 NOTE — Assessment & Plan Note (Signed)
Pt's PE WNL.  UTD on pap, Tdap.  Check labs.  Anticipatory guidance provided.  

## 2022-08-23 NOTE — Progress Notes (Signed)
   Subjective:    Patient ID: Lauren Small, female    DOB: July 08, 1986, 36 y.o.   MRN: [id number]  HPI CPE- UTD on pap, Tdap  Pt reports feeling 'pretty good'.  Health Maintenance  Topic Date Due   INFLUENZA VACCINE  03/23/2023 (Originally 07/23/2022)   PAP SMEAR-Modifier  02/14/2025   TETANUS/TDAP  06/22/2029   HIV Screening  Completed   HPV VACCINES  Aged Out   COVID-19 Vaccine  Discontinued   Hepatitis C Screening  Discontinued     Review of Systems Patient reports no hearing changes, adenopathy,fever, weight change,  persistant/recurrent hoarseness , swallowing issues, chest pain, palpitations, edema, persistant/recurrent cough, hemoptysis, dyspnea (rest/exertional/paroxysmal nocturnal), gastrointestinal bleeding (melena, rectal bleeding), significant heartburn, bowel changes, GU symptoms (dysuria, hematuria, incontinence), Gyn symptoms (abnormal  bleeding, pain),  syncope, focal weakness, memory loss, numbness & tingling, skin/nail changes, abnormal bleeding, anxiety, or depression.   + some blurry vision + ongoing abd pain- seeing GI + hair loss + easy bruising    Objective:   Physical Exam General Appearance:    Alert, cooperative, no distress, appears stated age  Head:    Normocephalic, without obvious abnormality, atraumatic  Eyes:    PERRL, conjunctiva/corneas clear, EOM's intact both eyes  Ears:    Normal TM's and external ear canals, both ears  Nose:   Nares normal, septum midline, mucosa normal, no drainage    or sinus tenderness  Throat:   Lips, mucosa, and tongue normal; teeth and gums normal  Neck:   Supple, symmetrical, trachea midline, no adenopathy;    Thyroid: no enlargement/tenderness/nodules  Back:     Symmetric, no curvature, ROM normal, no CVA tenderness  Lungs:     Clear to auscultation bilaterally, respirations unlabored  Chest Wall:    No tenderness or deformity   Heart:    Regular rate and rhythm, S1 and S2 normal, no murmur, rub   or gallop   Breast Exam:    Deferred to GYN  Abdomen:     Soft, non-tender, bowel sounds active all four quadrants,    no masses, no organomegaly  Genitalia:    Deferred to GYN  Rectal:    Extremities:   Extremities normal, atraumatic, no cyanosis or edema  Pulses:   2+ and symmetric all extremities  Skin:   Skin color, texture, turgor normal, no rashes or lesions  Lymph nodes:   Cervical, supraclavicular, and axillary nodes normal  Neurologic:   CNII-XII intact, normal strength, sensation and reflexes    throughout          Assessment & Plan:

## 2022-08-23 NOTE — Assessment & Plan Note (Signed)
New.  Started on Armour Thyroid by functional medicine for low T3.  Will follow.

## 2022-08-27 NOTE — Progress Notes (Signed)
Pt seen results via my chart  

## 2022-09-05 DIAGNOSIS — R1011 Right upper quadrant pain: Secondary | ICD-10-CM | POA: Diagnosis not present

## 2022-09-10 DIAGNOSIS — R1011 Right upper quadrant pain: Secondary | ICD-10-CM | POA: Diagnosis not present

## 2022-09-17 ENCOUNTER — Other Ambulatory Visit: Payer: Self-pay | Admitting: Gastroenterology

## 2022-09-18 DIAGNOSIS — R5383 Other fatigue: Secondary | ICD-10-CM | POA: Diagnosis not present

## 2022-09-18 DIAGNOSIS — R6882 Decreased libido: Secondary | ICD-10-CM | POA: Diagnosis not present

## 2022-09-18 DIAGNOSIS — Z77120 Contact with and (suspected) exposure to mold (toxic): Secondary | ICD-10-CM | POA: Diagnosis not present

## 2022-09-18 DIAGNOSIS — E038 Other specified hypothyroidism: Secondary | ICD-10-CM | POA: Diagnosis not present

## 2022-10-22 DIAGNOSIS — L219 Seborrheic dermatitis, unspecified: Secondary | ICD-10-CM | POA: Diagnosis not present

## 2022-10-22 DIAGNOSIS — L814 Other melanin hyperpigmentation: Secondary | ICD-10-CM | POA: Diagnosis not present

## 2022-10-22 DIAGNOSIS — D225 Melanocytic nevi of trunk: Secondary | ICD-10-CM | POA: Diagnosis not present

## 2022-10-22 DIAGNOSIS — L578 Other skin changes due to chronic exposure to nonionizing radiation: Secondary | ICD-10-CM | POA: Diagnosis not present

## 2022-10-31 DIAGNOSIS — K828 Other specified diseases of gallbladder: Secondary | ICD-10-CM | POA: Diagnosis not present

## 2022-11-04 DIAGNOSIS — R6882 Decreased libido: Secondary | ICD-10-CM | POA: Diagnosis not present

## 2022-11-04 DIAGNOSIS — R5383 Other fatigue: Secondary | ICD-10-CM | POA: Diagnosis not present

## 2022-11-04 DIAGNOSIS — E039 Hypothyroidism, unspecified: Secondary | ICD-10-CM | POA: Diagnosis not present

## 2022-11-04 DIAGNOSIS — E612 Magnesium deficiency: Secondary | ICD-10-CM | POA: Diagnosis not present

## 2022-11-19 DIAGNOSIS — E538 Deficiency of other specified B group vitamins: Secondary | ICD-10-CM | POA: Diagnosis not present

## 2022-11-19 DIAGNOSIS — E559 Vitamin D deficiency, unspecified: Secondary | ICD-10-CM | POA: Diagnosis not present

## 2022-11-19 DIAGNOSIS — E039 Hypothyroidism, unspecified: Secondary | ICD-10-CM | POA: Diagnosis not present

## 2022-11-19 DIAGNOSIS — E038 Other specified hypothyroidism: Secondary | ICD-10-CM | POA: Diagnosis not present

## 2022-11-19 DIAGNOSIS — R5383 Other fatigue: Secondary | ICD-10-CM | POA: Diagnosis not present

## 2022-11-19 DIAGNOSIS — E612 Magnesium deficiency: Secondary | ICD-10-CM | POA: Diagnosis not present

## 2022-11-28 NOTE — Progress Notes (Signed)
Office Visit Note  Patient: Lauren Small             Date of Birth: [birth year]           MRN: [id number]             PCP: Midge Minium, MD Referring: Midge Minium, MD Visit Date: 12/04/2022 Occupation: _0 @  Subjective:  Pain in multiple joints and positive ANA  History of Present Illness: Lauren Small is a 36 y.o. female in consultation per request of her PCP for the evaluation of positive ANA.  According the patient in 2020 during the postpartum.  She started experiencing increased fatigue, brain fog, dizziness and palpitations.  She states that she tried to exercise but it caused more fatigue.  In December 2022 she started having episodes of nausea vomiting diarrhea and abdominal pain.  She had extensive GI workup.  She is scheduled to have a cholecystectomy next week.  She also had evaluation by cardiologist due to palpitations and dizziness.  The cardiology workup was negative.  In March 2023 she started seeing a functional medicine doctor who diagnosed her with testosterone deficiency and placed her on testosterone.  She developed acne on testosterone and was placed on spironolactone.  She has not noticed much difference being on testosterone.  She has been on thyroid medication since August 2023 as she was diagnosed with hypothyroidism.  She has noticed improvement in her fatigue since she has been on the thyroid medications.  She also noticed improvement in hair loss since she has been on thyroid medications.  She states she gets dry skin patches on her scalp and her face.  In June 2023 her PCP did labs and her ANA was positive.  Double-stranded DNA was negative.  She was referred to me for further evaluation.  She denies any history of oral ulcers, nasal ulcers, Raynaud's phenomenon, lymphadenopathy or inflammatory arthritis.  She gives history of dry mouth and dry eyes.  She also gives history of joint pain especially in the hips and her knee joints.  She has  not noticed any joint swelling.  She has discomfort in the trapezius region.  She also had an episode last year with cervical radiculopathy which resolved completely.  There is history of rheumatoid arthritis in her maternal grandmother.  She is gravida 2, para 2, miscarriages 0.  Activities of Daily Living:  Patient reports morning stiffness for 5 minutes.   Patient Denies nocturnal pain.  Difficulty dressing/grooming: Denies Difficulty climbing stairs: Denies Difficulty getting out of chair: Denies Difficulty using hands for taps, buttons, cutlery, and/or writing: Denies  Review of Systems  Constitutional:  Positive for fatigue.  HENT:  Positive for mouth dryness. Negative for mouth sores.   Eyes:  Positive for dryness.  Respiratory:  Negative for difficulty breathing.   Cardiovascular:  Positive for palpitations. Negative for chest pain.  Gastrointestinal:  Positive for constipation and diarrhea. Negative for blood in stool.  Endocrine: Negative for increased urination.  Genitourinary:  Negative for involuntary urination.  Musculoskeletal:  Positive for joint pain, joint pain, myalgias, muscle weakness, morning stiffness, muscle tenderness and myalgias. Negative for gait problem and joint swelling.  Skin:  Positive for hair loss and sensitivity to sunlight. Negative for color change and rash.  Allergic/Immunologic: Negative for susceptible to infections.  Neurological:  Positive for dizziness and headaches.  Hematological:  Negative for swollen glands.  Psychiatric/Behavioral:  Negative for depressed mood and sleep disturbance. The patient is not nervous/anxious.  PMFS History:  Patient Active Problem List   Diagnosis Date Noted   Seasonal allergies 12/04/2022   Mild intermittent asthma without complication 25/63/8937   Hypothyroid 08/23/2022    Past Medical History:  Diagnosis Date   Allergy    Asthma    Blood in stool    GERD (gastroesophageal reflux disease)     History of chicken pox     Family History  Problem Relation Age of Onset   Thyroid disease Mother    Early death Father    Asthma Sister    Diabetes Sister        type 1   Asthma Sister    Early death Maternal Grandmother    Heart attack Maternal Grandfather    Hyperlipidemia Maternal Grandfather    Hypertension Maternal Grandfather    Healthy Daughter    Healthy Daughter    Past Surgical History:  Procedure Laterality Date   urethra polyp     WISDOM TOOTH EXTRACTION     Social History   Social History Narrative   Not on file   Immunization History  Administered Date(s) Administered   Influenza,inj,Quad PF,6+ Mos 09/17/2018, 08/27/2019, 09/22/2020   Influenza,inj,quad, With Preservative 09/20/2015, 10/23/2016, 08/23/2017   Influenza-Unspecified 08/23/2013, 10/23/2021   PFIZER(Purple Top)SARS-COV-2 Vaccination 02/16/2020, 03/08/2020   Tdap 06/17/2014, 08/07/2015, 06/23/2019     Objective: Vital Signs: BP 115/80 (BP Location: Right Arm, Patient Position: Sitting, Cuff Size: Normal)   Pulse 64   Resp 13   Ht _0  (1.676 m)   Wt 143 lb (64.9 kg)   BMI 23.08 kg/m    Physical Exam Vitals and nursing note reviewed.  Constitutional:      Appearance: She is well-developed.  HENT:     Head: Normocephalic and atraumatic.  Eyes:     Conjunctiva/sclera: Conjunctivae normal.  Cardiovascular:     Rate and Rhythm: Normal rate and regular rhythm.     Heart sounds: Normal heart sounds.  Pulmonary:     Effort: Pulmonary effort is normal.     Breath sounds: Normal breath sounds.  Abdominal:     General: Bowel sounds are normal.     Palpations: Abdomen is soft.  Musculoskeletal:     Cervical back: Normal range of motion.  Lymphadenopathy:     Cervical: No cervical adenopathy.  Skin:    General: Skin is warm and dry.     Capillary Refill: Capillary refill takes less than 2 seconds.  Neurological:     Mental Status: She is alert and oriented to person, place, and time.   Psychiatric:        Behavior: Behavior normal.      Musculoskeletal Exam: Cervical, thoracic and lumbar spine were in good range of motion.  She had bilateral trapezius spasm.  Shoulder joints, elbow joints, wrist joints, MCPs PIPs and DIPs been good range of motion with no synovitis.  She good range of motion of bilateral hip joints with discomfort.  She had painful range of motion of bilateral knee joints without any warmth swelling or effusion.  There was no tenderness over ankles or MTPs.  She had hyperalgesia and positive tender points.  CDAI Exam: CDAI Score: -- Patient Global: --; Provider Global: -- Swollen: --; Tender: -- Joint Exam 12/04/2022   No joint exam has been documented for this visit   There is currently no information documented on the homunculus. Go to the Rheumatology activity and complete the homunculus joint exam.  Investigation: No additional findings.  Imaging:  No results found.  Recent Labs: Lab Results  Component Value Date   WBC 6.2 08/23/2022   HGB 13.2 08/23/2022   PLT 308.0 08/23/2022   NA 138 08/23/2022   K 4.2 08/23/2022   CL 104 08/23/2022   CO2 28 08/23/2022   GLUCOSE 86 08/23/2022   BUN 14 08/23/2022   CREATININE 0.81 08/23/2022   BILITOT 1.7 (H) 08/23/2022   ALKPHOS 42 08/23/2022   AST 15 08/23/2022   ALT 12 08/23/2022   PROT 7.2 08/23/2022   ALBUMIN 4.5 08/23/2022   CALCIUM 9.6 08/23/2022   GFRAA >60 09/14/2020    Speciality Comments: No specialty comments available.  Procedures:  No procedures performed Allergies: Erythromycin, Erythromycin base, Azithromycin, and Ciprofloxacin   Assessment / Plan:     Visit Diagnoses: Positive ANA (antinuclear antibody) -patient was to referred to me for the evaluation of positive ANA.  05/27/22: ANA 1:80NS, 1:80NH, 1:80 mitotic, intercellular bridge, ESR 5. 06/24/22:IgA WNL, tTG-, CRP WNL, dsDNA negative -she denies any history of oral ulcers, nasal ulcers, malar rash, Raynaud's phenomenon  or lymphadenopathy.  She gives history of arthralgias, sicca symptoms and hair loss.  She states that hair loss improved after on thyroid supplement.  Plan: ANA, RNP Antibody, Anti-Smith antibody, Sjogrens syndrome-A extractable nuclear antibody, Sjogrens syndrome-B extractable nuclear antibody, Anti-scleroderma antibody, C3 and C4  Trapezius muscle spasm-she complains of the tightness around her neck.  She had bilateral trapezius spasm.  Chronic pain of both hips -she complains of pain and discomfort in her bilateral hip joints.  She is very active.  She walks and does strength training.  She good range of motion of bilateral hip joints.  I will obtain x-rays today.  Plan: XR HIPS BILAT W OR W/O PELVIS 3-4 VIEWS.  X-rays of bilateral SI joints and hip joints were unremarkable.  Chronic pain of both knees -she has ongoing pain and discomfort in the bilateral knee joints.  No warmth swelling or effusion was noted.  Plan: XR KNEE 3 VIEW RIGHT, XR KNEE 3 VIEW LEFT, x-rays of bilateral knee joints were unremarkable.  Rheumatoid factor, Cyclic citrul peptide antibody, IgG  Other fatigue-she gives history of chronic fatigue.  She has noticed some improvement in the fatigue since she has been taking thyroid medications.  Myofascial pain -she also has generalized muscle pain and episodic increased muscle pain.  She had positive tender points.  Possibility of myofascial pain syndrome was discussed with the patient.  Plan: CK.  Benefits of water aerobics swimming and stretching were discussed.  History of hypothyroidism -she was diagnosed with hypothyroidism in August 2023.  She has been on thyroid medication since then.  Positive ANA could be associated with autoimmune thyroiditis.  I will check thyroid antibodies today.  Plan: Thyroid peroxidase antibody, Thyroglobulin antibody  Other acne -she states she developed acne after taking testosterone for fatigue.  She is currently on  spironolactone due to  acne..  Seasonal allergies  Mild intermittent asthma without complication  Family history of rheumatoid arthritis-maternal grandmother  Orders: Orders Placed This Encounter  Procedures   XR KNEE 3 VIEW RIGHT   XR KNEE 3 VIEW LEFT   XR HIPS BILAT W OR W/O PELVIS 3-4 VIEWS   Rheumatoid factor   Cyclic citrul peptide antibody, IgG   ANA   RNP Antibody   Anti-Smith antibody   Sjogrens syndrome-A extractable nuclear antibody   Sjogrens syndrome-B extractable nuclear antibody   Anti-scleroderma antibody   C3 and C4   CK  Thyroid peroxidase antibody   Thyroglobulin antibody   No orders of the defined types were placed in this encounter.   Follow-Up Instructions: Return for Positive ANA, polyarthralgia.   Bo Merino, MD  Note - This record has been created using Editor, commissioning.  Chart creation errors have been sought, but may not always  have been located. Such creation errors do not reflect on  the standard of medical care.

## 2022-11-29 ENCOUNTER — Ambulatory Visit (INDEPENDENT_AMBULATORY_CARE_PROVIDER_SITE_OTHER): Payer: BC Managed Care – PPO | Admitting: Family Medicine

## 2022-11-29 ENCOUNTER — Encounter: Payer: Self-pay | Admitting: Family Medicine

## 2022-11-29 VITALS — BP 106/60 | HR 75 | Temp 98.0°F | Resp 16 | Ht 66.0 in | Wt 141.5 lb

## 2022-11-29 DIAGNOSIS — E039 Hypothyroidism, unspecified: Secondary | ICD-10-CM | POA: Diagnosis not present

## 2022-11-29 MED ORDER — LIOTHYRONINE SODIUM 5 MCG PO TABS: 5.0000 ug | ORAL_TABLET | Freq: Every day | ORAL | 3 refills | Status: DC

## 2022-11-29 MED ORDER — THYROID 30 MG PO TABS: 30.0000 mg | ORAL_TABLET | Freq: Every day | ORAL | 3 refills | Status: DC

## 2022-11-29 MED ORDER — THYROID 15 MG PO TABS: 15.0000 mg | ORAL_TABLET | Freq: Every day | ORAL | 1 refills | Status: DC

## 2022-11-29 NOTE — Patient Instructions (Addendum)
Follow up in 1 month to recheck thyroid labs- sooner if needed STOP the 60mg  Armour Thyroid RESTART the 45mg  Armour Thyroid on Monday (this gives your body a couple days to drop back into range) START the Liothyronine 5mg  daily on Monday If we're still having issues w/ symptoms or the labs look off, we'll switch from Armour to Levothyroxine at the next visit Call with any questions or concerns Hang in there! Happy Holidays!

## 2022-11-29 NOTE — Progress Notes (Unsigned)
   Subjective:    Patient ID: Lauren Small, female    DOB: December 29, 1985, 36 y.o.   MRN: [id number]  HPI Thyroid issues- pt saw Iva Lento at [facility] who started her on Armour Thyroid 60mg  daily.  Pt reports she just had labs done- brought them today.  T4 is at high end of normal, T3 was at low end of normal, and TSH is at very low end of normal.  Pt states she was having issues w/ cold, dry hair/skin/nails, hair loss, fatigue- described as 'heavy muscles'.  Pt reports dose was increased from 45 --> 60 and heavy muscles improved but then developed palpitations, feeling loopy, diarrhea, jitters, feeling hot, increased thirst.  + HA   Review of Systems For ROS see HPI     Objective:   Physical Exam Vitals reviewed.  Constitutional:      General: She is not in acute distress.    Appearance: Normal appearance. She is not ill-appearing.  HENT:     Head: Normocephalic and atraumatic.  Eyes:     Extraocular Movements: Extraocular movements intact.     Conjunctiva/sclera: Conjunctivae normal.     Pupils: Pupils are equal, round, and reactive to light.  Neurological:     General: No focal deficit present.     Mental Status: She is alert and oriented to person, place, and time.     Cranial Nerves: No cranial nerve deficit.     Motor: No weakness.     Coordination: Coordination normal.     Gait: Gait normal.  Psychiatric:        Mood and Affect: Mood normal.        Behavior: Behavior normal.        Thought Content: Thought content normal.           Assessment & Plan:

## 2022-12-01 NOTE — Assessment & Plan Note (Signed)
Chronic problem but has deteriorated.  Unfortunately the [facility] provided did not correctly interpret her thyroid results and increased her armour thyroid dose.  She is now having hyperthyroid sxs.  Will decrease Armour back to previous dose and add Liothyronine daily to improve her persistent hypothyroid sxs.  Pt expressed understanding and is in agreement w/ plan.

## 2022-12-04 ENCOUNTER — Ambulatory Visit: Payer: BC Managed Care – PPO | Attending: Rheumatology | Admitting: Rheumatology

## 2022-12-04 ENCOUNTER — Encounter: Payer: Self-pay | Admitting: Rheumatology

## 2022-12-04 ENCOUNTER — Ambulatory Visit: Payer: BC Managed Care – PPO

## 2022-12-04 ENCOUNTER — Ambulatory Visit (INDEPENDENT_AMBULATORY_CARE_PROVIDER_SITE_OTHER): Payer: BC Managed Care – PPO

## 2022-12-04 VITALS — BP 115/80 | HR 64 | Resp 13 | Ht 66.0 in | Wt 143.0 lb

## 2022-12-04 DIAGNOSIS — M25562 Pain in left knee: Secondary | ICD-10-CM

## 2022-12-04 DIAGNOSIS — J302 Other seasonal allergic rhinitis: Secondary | ICD-10-CM

## 2022-12-04 DIAGNOSIS — M7918 Myalgia, other site: Secondary | ICD-10-CM | POA: Diagnosis not present

## 2022-12-04 DIAGNOSIS — M25552 Pain in left hip: Secondary | ICD-10-CM | POA: Diagnosis not present

## 2022-12-04 DIAGNOSIS — M25551 Pain in right hip: Secondary | ICD-10-CM | POA: Diagnosis not present

## 2022-12-04 DIAGNOSIS — L708 Other acne: Secondary | ICD-10-CM

## 2022-12-04 DIAGNOSIS — M25561 Pain in right knee: Secondary | ICD-10-CM

## 2022-12-04 DIAGNOSIS — G8929 Other chronic pain: Secondary | ICD-10-CM

## 2022-12-04 DIAGNOSIS — M62838 Other muscle spasm: Secondary | ICD-10-CM

## 2022-12-04 DIAGNOSIS — R5383 Other fatigue: Secondary | ICD-10-CM

## 2022-12-04 DIAGNOSIS — Z8261 Family history of arthritis: Secondary | ICD-10-CM

## 2022-12-04 DIAGNOSIS — R768 Other specified abnormal immunological findings in serum: Secondary | ICD-10-CM

## 2022-12-04 DIAGNOSIS — Z8639 Personal history of other endocrine, nutritional and metabolic disease: Secondary | ICD-10-CM

## 2022-12-04 DIAGNOSIS — M5412 Radiculopathy, cervical region: Secondary | ICD-10-CM

## 2022-12-04 DIAGNOSIS — J4520 Mild intermittent asthma, uncomplicated: Secondary | ICD-10-CM

## 2022-12-07 LAB — ANTI-NUCLEAR AB-TITER (ANA TITER)
ANA TITER: 1:40 {titer} — ABNORMAL HIGH
ANA TITER: 1:40 {titer} — ABNORMAL HIGH
ANA Titer 1: 1:80 {titer} — ABNORMAL HIGH

## 2022-12-07 LAB — ANTI-SCLERODERMA ANTIBODY: Scleroderma (Scl-70) (ENA) Antibody, IgG: <1 AI

## 2022-12-07 LAB — SJOGRENS SYNDROME-B EXTRACTABLE NUCLEAR ANTIBODY: SSB (La) (ENA) Antibody, IgG: <1 AI

## 2022-12-07 LAB — C3 AND C4
C3 Complement: 105 mg/dL (ref 83–193)
C4 Complement: 23 mg/dL (ref 15–57)

## 2022-12-07 LAB — THYROGLOBULIN ANTIBODY: Thyroglobulin Ab: <1 IU/mL (ref <=1)

## 2022-12-07 LAB — CK: Total CK: 34 U/L (ref 29–143)

## 2022-12-07 LAB — SJOGRENS SYNDROME-A EXTRACTABLE NUCLEAR ANTIBODY: SSA (Ro) (ENA) Antibody, IgG: <1 AI

## 2022-12-07 LAB — CYCLIC CITRUL PEPTIDE ANTIBODY, IGG: Cyclic Citrullin Peptide Ab: <16 UNITS

## 2022-12-07 LAB — THYROID PEROXIDASE ANTIBODY: Thyroperoxidase Ab SerPl-aCnc: 1 IU/mL (ref <9)

## 2022-12-07 LAB — ANTI-SMITH ANTIBODY: ENA SM Ab Ser-aCnc: <1 AI

## 2022-12-07 LAB — ANA: Anti Nuclear Antibody (ANA): POSITIVE — AB

## 2022-12-07 LAB — RHEUMATOID FACTOR: Rheumatoid fact SerPl-aCnc: <14 IU/mL (ref <14)

## 2022-12-07 LAB — RNP ANTIBODY: Ribonucleic Protein(ENA) Antibody, IgG: <1 AI

## 2022-12-09 NOTE — Progress Notes (Signed)
Besides ANA all other labs are negative. I will discuss the lab results at the fu visit.

## 2022-12-12 ENCOUNTER — Ambulatory Visit: Payer: BC Managed Care – PPO | Admitting: Rheumatology

## 2022-12-12 ENCOUNTER — Other Ambulatory Visit: Payer: Self-pay | Admitting: General Surgery

## 2022-12-12 DIAGNOSIS — K828 Other specified diseases of gallbladder: Secondary | ICD-10-CM | POA: Diagnosis not present

## 2022-12-12 HISTORY — PX: CHOLECYSTECTOMY: SHX55

## 2022-12-27 NOTE — Progress Notes (Deleted)
Office Visit Note  Patient: Lauren Small             Date of Birth: 26-Dec-1985           MRN: [id number]             PCP: Midge Minium, MD Referring: Midge Minium, MD Visit Date: 01/07/2023 Occupation: @GUAROCC$ @  Subjective:  No chief complaint on file.   History of Present Illness: Lauren Small is a 37 y.o. female ***     Activities of Daily Living:  Patient reports morning stiffness for *** {minute/hour:19697}.   Patient {ACTIONS;DENIES/REPORTS:21021675::"Denies"} nocturnal pain.  Difficulty dressing/grooming: {ACTIONS;DENIES/REPORTS:21021675::"Denies"} Difficulty climbing stairs: {ACTIONS;DENIES/REPORTS:21021675::"Denies"} Difficulty getting out of chair: {ACTIONS;DENIES/REPORTS:21021675::"Denies"} Difficulty using hands for taps, buttons, cutlery, and/or writing: {ACTIONS;DENIES/REPORTS:21021675::"Denies"}  No Rheumatology ROS completed.   PMFS History:  Patient Active Problem List   Diagnosis Date Noted   Seasonal allergies 12/04/2022   Mild intermittent asthma without complication [id number]   Hypothyroid 08/23/2022    Past Medical History:  Diagnosis Date   Allergy    Asthma    Blood in stool    GERD (gastroesophageal reflux disease)    History of chicken pox     Family History  Problem Relation Age of Onset   Thyroid disease Mother    Early death Father    Asthma Sister    Diabetes Sister        type 1   Asthma Sister    Early death Maternal Grandmother    Heart attack Maternal Grandfather    Hyperlipidemia Maternal Grandfather    Hypertension Maternal Grandfather    Healthy Daughter    Healthy Daughter    Past Surgical History:  Procedure Laterality Date   urethra polyp     WISDOM TOOTH EXTRACTION     Social History   Social History Narrative   Not on file   Immunization History  Administered Date(s) Administered   Influenza,inj,Quad PF,6+ Mos 09/17/2018, 08/27/2019, 09/22/2020   Influenza,inj,quad, With  Preservative 09/20/2015, 10/23/2016, 08/23/2017   Influenza-Unspecified 08/23/2013, 10/23/2021   PFIZER(Purple Top)SARS-COV-2 Vaccination 02/16/2020, 03/08/2020   Tdap 06/17/2014, 08/07/2015, 06/23/2019     Objective: Vital Signs: There were no vitals taken for this visit.   Physical Exam   Musculoskeletal Exam: ***  CDAI Exam: CDAI Score: -- Patient Global: --; Provider Global: -- Swollen: --; Tender: -- Joint Exam 01/07/2023   No joint exam has been documented for this visit   There is currently no information documented on the homunculus. Go to the Rheumatology activity and complete the homunculus joint exam.  Investigation: No additional findings.  Imaging: XR KNEE 3 VIEW LEFT  Result Date: 12/04/2022 No medial or lateral compartment narrowing was noted.  No patellofemoral narrowing was noted.  No chondrocalcinosis was noted. Impression: Unremarkable x-rays of the knee joint.  XR KNEE 3 VIEW RIGHT  Result Date: 12/04/2022 No medial or lateral compartment narrowing was noted.  No patellofemoral narrowing was noted.  No chondrocalcinosis was noted. Impression: Unremarkable x-rays of the knee joint.  XR HIPS BILAT W OR W/O PELVIS 3-4 VIEWS  Result Date: 12/04/2022 No SI joint narrowing or sclerosis was noted.  No hip joint narrowing was noted. Impression: Unremarkable x-rays of the hip joints and SI joints.   Recent Labs: Lab Results  Component Value Date   WBC 6.2 08/23/2022   HGB 13.2 08/23/2022   PLT 308.0 08/23/2022   NA 138 08/23/2022   K 4.2 08/23/2022   CL 104 08/23/2022  CO2 28 08/23/2022   GLUCOSE 86 08/23/2022   BUN 14 08/23/2022   CREATININE 0.81 08/23/2022   BILITOT 1.7 (H) 08/23/2022   ALKPHOS 42 08/23/2022   AST 15 08/23/2022   ALT 12 08/23/2022   PROT 7.2 08/23/2022   ALBUMIN 4.5 08/23/2022   CALCIUM 9.6 08/23/2022   GFRAA >60 09/14/2020   December 04, 2022 ANA 1: 80 mitotic, 1: 40 cytoplasmic,NH, ENA (RNP, Smith, SSA, SSB, SCL 70)  negative, C3-C4 normal, RF negative, anti-CCP negative, CK 34, anti-TPO negative, thyroglobulin antibody negative  Speciality Comments: No specialty comments available.  Procedures:  No procedures performed Allergies: Erythromycin, Erythromycin base, Azithromycin, and Ciprofloxacin   Assessment / Plan:     Visit Diagnoses: Positive ANA (antinuclear antibody)  Trapezius muscle spasm  Chronic pain of both hips  Chronic pain of both knees  Other fatigue  Myofascial pain  History of hypothyroidism  Seasonal allergies  Other acne  Mild intermittent asthma without complication  Family history of rheumatoid arthritis-maternal grandmother  Orders: No orders of the defined types were placed in this encounter.  No orders of the defined types were placed in this encounter.   Face-to-face time spent with patient was *** minutes. Greater than 50% of time was spent in counseling and coordination of care.  Follow-Up Instructions: No follow-ups on file.   Bo Merino, MD  Note - This record has been created using Editor, commissioning.  Chart creation errors have been sought, but may not always  have been located. Such creation errors do not reflect on  the standard of medical care.

## 2022-12-31 ENCOUNTER — Telehealth: Payer: Self-pay

## 2022-12-31 ENCOUNTER — Ambulatory Visit: Payer: BC Managed Care – PPO | Admitting: Family Medicine

## 2022-12-31 ENCOUNTER — Encounter: Payer: Self-pay | Admitting: Family Medicine

## 2022-12-31 VITALS — BP 124/80 | HR 75 | Temp 97.6°F | Resp 17 | Ht 66.0 in | Wt 145.0 lb

## 2022-12-31 DIAGNOSIS — E039 Hypothyroidism, unspecified: Secondary | ICD-10-CM

## 2022-12-31 LAB — TSH: TSH: 0.59 u[IU]/mL (ref 0.35–5.50)

## 2022-12-31 LAB — T3, FREE: T3, Free: 3.5 pg/mL (ref 2.3–4.2)

## 2022-12-31 LAB — T4, FREE: Free T4: 0.78 ng/dL (ref 0.60–1.60)

## 2022-12-31 NOTE — Telephone Encounter (Signed)
-----   Message from Midge Minium, MD sent at 12/31/2022  3:59 PM EST ----- TSH is in normal range meaning Armour thyroid dose is appropriate.  T3 and T4 are also normal- great news!!!  No med changes at this time

## 2022-12-31 NOTE — Patient Instructions (Signed)
Follow up in 2-3 months to recheck thyroid We'll notify you of your lab results and make any changes if needed Keep up the good work!  You look great!!! Call with any questions or concerns Stay Safe!  Stay Healthy! Happy New Year!!!

## 2022-12-31 NOTE — Telephone Encounter (Signed)
Left vm of lab results

## 2022-12-31 NOTE — Progress Notes (Signed)
   Subjective:    Patient ID: Lauren Small, female    DOB: 06-21-1986, 37 y.o.   MRN: [id number]  HPI Hypothyroid- at last visit, we had to decrease her Armour thyroid to 45mg  daily and started Liothyronine 5mg  daily.  Pt reports feeling 'pretty good'.  In the last week or so, she has developed some fatigue and chills.  No longer having palpitations or hair loss.   Review of Systems For ROS see HPI     Objective:   Physical Exam Vitals reviewed.  Constitutional:      General: She is not in acute distress.    Appearance: Normal appearance. She is well-developed. She is not ill-appearing.  HENT:     Head: Normocephalic and atraumatic.  Eyes:     Conjunctiva/sclera: Conjunctivae normal.     Pupils: Pupils are equal, round, and reactive to light.  Neck:     Thyroid: No thyromegaly.  Cardiovascular:     Rate and Rhythm: Normal rate and regular rhythm.     Heart sounds: Normal heart sounds. No murmur heard. Pulmonary:     Effort: Pulmonary effort is normal. No respiratory distress.     Breath sounds: Normal breath sounds.  Abdominal:     General: There is no distension.     Palpations: Abdomen is soft.     Tenderness: There is no abdominal tenderness.  Musculoskeletal:     Cervical back: Normal range of motion and neck supple.  Lymphadenopathy:     Cervical: No cervical adenopathy.  Skin:    General: Skin is warm and dry.  Neurological:     Mental Status: She is alert and oriented to person, place, and time.  Psychiatric:        Behavior: Behavior normal.           Assessment & Plan:

## 2022-12-31 NOTE — Assessment & Plan Note (Signed)
Chronic problem for pt.  At last visit, her medication was actually giving her sxs of hyperthyroid.  She reports she has done much better on Armour 45mg  daily and Liothyronine 5mg  daily.  She has just recently noted fatigue and chills.  Check labs.  Adjust meds prn

## 2023-01-07 ENCOUNTER — Ambulatory Visit: Payer: BC Managed Care – PPO | Admitting: Rheumatology

## 2023-01-07 DIAGNOSIS — J302 Other seasonal allergic rhinitis: Secondary | ICD-10-CM

## 2023-01-07 DIAGNOSIS — R768 Other specified abnormal immunological findings in serum: Secondary | ICD-10-CM

## 2023-01-07 DIAGNOSIS — Z8639 Personal history of other endocrine, nutritional and metabolic disease: Secondary | ICD-10-CM

## 2023-01-07 DIAGNOSIS — M62838 Other muscle spasm: Secondary | ICD-10-CM

## 2023-01-07 DIAGNOSIS — J4520 Mild intermittent asthma, uncomplicated: Secondary | ICD-10-CM

## 2023-01-07 DIAGNOSIS — M7918 Myalgia, other site: Secondary | ICD-10-CM

## 2023-01-07 DIAGNOSIS — G8929 Other chronic pain: Secondary | ICD-10-CM

## 2023-01-07 DIAGNOSIS — R5383 Other fatigue: Secondary | ICD-10-CM

## 2023-01-07 DIAGNOSIS — Z8261 Family history of arthritis: Secondary | ICD-10-CM

## 2023-01-07 DIAGNOSIS — L708 Other acne: Secondary | ICD-10-CM

## 2023-01-08 NOTE — Progress Notes (Signed)
Office Visit Note  Patient: Lauren Small             Date of Birth: December 31, 1985           MRN: [id number]             PCP: Midge Minium, MD Referring: Midge Minium, MD Visit Date: 01/21/2023 Occupation: @GUAROCC @  Subjective:  Positive ANA  History of Present Illness: Lauren Small is a 37 y.o. female with history of positive ANA.  Patient states that since her last visit she had cholecystectomy on December 12, 2022.  She had COVID-19 virus infection after that.  She is gradually improving and she feels her overall symptoms have improved.  She continues to have some joint stiffness.  She has not noticed any joint swelling.  There is no history of oral ulcers, nasal ulcers, malar rash, photosensitivity, Raynaud's phenomenon, lymphadenopathy or inflammatory arthritis.    Activities of Daily Living:  Patient reports morning stiffness for 1 minute.   Patient Denies nocturnal pain.  Difficulty dressing/grooming: Denies Difficulty climbing stairs: Denies Difficulty getting out of chair: Denies Difficulty using hands for taps, buttons, cutlery, and/or writing: Denies  Review of Systems  Constitutional:  Negative for fatigue.  HENT:  Negative for mouth sores and mouth dryness.   Eyes:  Negative for dryness.  Respiratory:  Negative for shortness of breath.   Cardiovascular:  Negative for chest pain and palpitations.  Gastrointestinal:  Positive for constipation. Negative for blood in stool and diarrhea.  Endocrine: Negative for increased urination.  Genitourinary:  Negative for involuntary urination.  Musculoskeletal:  Positive for morning stiffness. Negative for joint pain, gait problem, joint pain, joint swelling, myalgias, muscle weakness, muscle tenderness and myalgias.  Skin:  Negative for color change, rash, hair loss and sensitivity to sunlight.  Allergic/Immunologic: Negative for susceptible to infections.  Neurological:  Positive for dizziness and  headaches.  Hematological:  Negative for swollen glands.  Psychiatric/Behavioral:  Positive for sleep disturbance. Negative for depressed mood. The patient is nervous/anxious.     PMFS History:  Patient Active Problem List   Diagnosis Date Noted   Seasonal allergies 12/04/2022   Mild intermittent asthma without complication 46/27/0350   Hypothyroid 08/23/2022    Past Medical History:  Diagnosis Date   Allergy    Asthma    Blood in stool    GERD (gastroesophageal reflux disease)    History of chicken pox     Family History  Problem Relation Age of Onset   Thyroid disease Mother    Early death Father    Asthma Sister    Diabetes Sister        type 1   Asthma Sister    Early death Maternal Grandmother    Heart attack Maternal Grandfather    Hyperlipidemia Maternal Grandfather    Hypertension Maternal Grandfather    Healthy Daughter    Healthy Daughter    Past Surgical History:  Procedure Laterality Date   CHOLECYSTECTOMY  12/12/2022   urethra polyp     WISDOM TOOTH EXTRACTION     Social History   Social History Narrative   Not on file   Immunization History  Administered Date(s) Administered   Influenza,inj,Quad PF,6+ Mos 09/17/2018, 08/27/2019, 09/22/2020   Influenza,inj,quad, With Preservative 09/20/2015, 10/23/2016, 08/23/2017   Influenza-Unspecified 08/23/2013, 10/23/2021   PFIZER(Purple Top)SARS-COV-2 Vaccination 02/16/2020, 03/08/2020   Tdap 06/17/2014, 08/07/2015, 06/23/2019     Objective: Vital Signs: BP 100/64 (BP Location: Left Arm, Patient Position: Sitting, Cuff  Size: Normal)   Pulse 69   Resp 14   Ht 5' 6.5" (1.689 m)   Wt 144 lb (65.3 kg)   BMI 22.89 kg/m    Physical Exam Vitals and nursing note reviewed.  Constitutional:      Appearance: She is well-developed.  HENT:     Head: Normocephalic and atraumatic.  Eyes:     Conjunctiva/sclera: Conjunctivae normal.  Cardiovascular:     Rate and Rhythm: Normal rate and regular rhythm.      Heart sounds: Normal heart sounds.  Pulmonary:     Effort: Pulmonary effort is normal.     Breath sounds: Normal breath sounds.  Abdominal:     General: Bowel sounds are normal.     Palpations: Abdomen is soft.  Musculoskeletal:     Cervical back: Normal range of motion.  Lymphadenopathy:     Cervical: No cervical adenopathy.  Skin:    General: Skin is warm and dry.     Capillary Refill: Capillary refill takes less than 2 seconds.  Neurological:     Mental Status: She is alert and oriented to person, place, and time.  Psychiatric:        Behavior: Behavior normal.      Musculoskeletal Exam: Cervical, thoracic and lumbar spine were in good range of motion.  Shoulders, elbows, wrist joints, MCPs PIPs and DIPs been good range of motion with no synovitis.  Hip joints, knee joints, ankles, MTPs and PIPs been good range of motion with no synovitis.  CDAI Exam: CDAI Score: -- Patient Global: --; Provider Global: -- Swollen: --; Tender: -- Joint Exam 01/21/2023   No joint exam has been documented for this visit   There is currently no information documented on the homunculus. Go to the Rheumatology activity and complete the homunculus joint exam.  Investigation: No additional findings.  Imaging: No results found.  Recent Labs: Lab Results  Component Value Date   WBC 6.2 08/23/2022   HGB 13.2 08/23/2022   PLT 308.0 08/23/2022   NA 138 08/23/2022   K 4.2 08/23/2022   CL 104 08/23/2022   CO2 28 08/23/2022   GLUCOSE 86 08/23/2022   BUN 14 08/23/2022   CREATININE 0.81 08/23/2022   BILITOT 1.7 (H) 08/23/2022   ALKPHOS 42 08/23/2022   AST 15 08/23/2022   ALT 12 08/23/2022   PROT 7.2 08/23/2022   ALBUMIN 4.5 08/23/2022   CALCIUM 9.6 08/23/2022   GFRAA >60 09/14/2020    December 04, 2022 ANA 1: 80 mitotic,NH, cytoplasmic, ENA (RNP, Smith, SSA, SSB, SCL 70) negative, C3-C4 normal, RF negative, anti-CCP negative, CK 34, TPO antibody negative, thyroglobulin  negative  Speciality Comments: No specialty comments available.  Procedures:  No procedures performed Allergies: Erythromycin, Erythromycin base, Azithromycin, Ciprofloxacin, and Tramadol   Assessment / Plan:     Visit Diagnoses: Positive ANA (antinuclear antibody) - ANA low titer positive, ENA negative, there is no history of oral ulcers, nasal ulcers, malar rash, photosensitivity, Raynaud's, lymphadenopathy.  Hair loss.  Lab results were discussed with the patient at length.  I advised her to contact me if she develops any new symptoms.  At this point she does not meet criteria for any autoimmune disease.  I plan to repeat labs in 1 year.    Trapezius muscle spasm - Bilateral trapezius spasm noted, most likely related to myofascial pain syndrome.  Chronic pain of both hips - She had good range of motion of bilateral hip joints without discomfort.  X-rays were  unremarkable.  X-ray findings were reviewed with the patient.  Chronic pain of both knees - History of knee joint discomfort.  No warmth swelling or effusion was noted.  X-rays were unremarkable.  X-ray findings were reviewed with the patient.  Myofascial pain - History of generalized pain, positive tender points consistent with myofascial discomfort.  Water aerobics, stretching and swimming were advised.  I offered referral to integrative therapies which she declined.  Other fatigue - History of chronic fatigue.  Fatigue improved down supplement.  Mild intermittent asthma without complication  History of hypothyroidism - Diagnosed August 2023.  TPO antibody and thyroglobulin negative.  Hair loss improved on thyroid supplement per patient.  Other acne  Seasonal allergies  Family history of rheumatoid arthritis-maternal grandmother  Orders: Orders Placed This Encounter  Procedures   Protein / creatinine ratio, urine   CBC with Differential/Platelet   COMPLETE METABOLIC PANEL WITH GFR   ANA   Anti-DNA antibody,  double-stranded   C3 and C4   Sedimentation rate   No orders of the defined types were placed in this encounter.    Follow-Up Instructions: Return in about 1 year (around 01/22/2024) for +ANA, MFPS.   Bo Merino, MD  Note - This record has been created using Editor, commissioning.  Chart creation errors have been sought, but may not always  have been located. Such creation errors do not reflect on  the standard of medical care.

## 2023-01-21 ENCOUNTER — Ambulatory Visit: Payer: BC Managed Care – PPO | Attending: Rheumatology | Admitting: Rheumatology

## 2023-01-21 ENCOUNTER — Encounter: Payer: Self-pay | Admitting: Rheumatology

## 2023-01-21 VITALS — BP 100/64 | HR 69 | Resp 14 | Ht 66.5 in | Wt 144.0 lb

## 2023-01-21 DIAGNOSIS — L708 Other acne: Secondary | ICD-10-CM

## 2023-01-21 DIAGNOSIS — M25562 Pain in left knee: Secondary | ICD-10-CM

## 2023-01-21 DIAGNOSIS — R5383 Other fatigue: Secondary | ICD-10-CM

## 2023-01-21 DIAGNOSIS — M25552 Pain in left hip: Secondary | ICD-10-CM

## 2023-01-21 DIAGNOSIS — G8929 Other chronic pain: Secondary | ICD-10-CM

## 2023-01-21 DIAGNOSIS — M62838 Other muscle spasm: Secondary | ICD-10-CM | POA: Diagnosis not present

## 2023-01-21 DIAGNOSIS — Z8261 Family history of arthritis: Secondary | ICD-10-CM

## 2023-01-21 DIAGNOSIS — J302 Other seasonal allergic rhinitis: Secondary | ICD-10-CM

## 2023-01-21 DIAGNOSIS — M7918 Myalgia, other site: Secondary | ICD-10-CM

## 2023-01-21 DIAGNOSIS — M25551 Pain in right hip: Secondary | ICD-10-CM

## 2023-01-21 DIAGNOSIS — Z8639 Personal history of other endocrine, nutritional and metabolic disease: Secondary | ICD-10-CM

## 2023-01-21 DIAGNOSIS — J4520 Mild intermittent asthma, uncomplicated: Secondary | ICD-10-CM

## 2023-01-21 DIAGNOSIS — R768 Other specified abnormal immunological findings in serum: Secondary | ICD-10-CM | POA: Diagnosis not present

## 2023-01-21 DIAGNOSIS — M25561 Pain in right knee: Secondary | ICD-10-CM | POA: Diagnosis not present

## 2023-02-03 ENCOUNTER — Ambulatory Visit: Payer: BC Managed Care – PPO | Admitting: Family Medicine

## 2023-02-03 ENCOUNTER — Encounter: Payer: Self-pay | Admitting: Family Medicine

## 2023-02-03 VITALS — BP 118/78 | HR 59 | Temp 97.6°F | Resp 16 | Ht 66.5 in | Wt 147.0 lb

## 2023-02-03 DIAGNOSIS — J320 Chronic maxillary sinusitis: Secondary | ICD-10-CM

## 2023-02-03 MED ORDER — AMOXICILLIN 875 MG PO TABS: 875.0000 mg | ORAL_TABLET | Freq: Two times a day (BID) | ORAL | 0 refills | Status: AC

## 2023-02-03 MED ORDER — FLUCONAZOLE 150 MG PO TABS: 150.0000 mg | ORAL_TABLET | Freq: Once | ORAL | 0 refills | Status: AC

## 2023-02-03 NOTE — Patient Instructions (Signed)
Follow up as needed or as scheduled START the Amoxicillin twice daily- take w/ food Drink LOTS of fluids Continue Tylenol and Ibuprofen as needed for pain Hot compresses to help w/ lymph node pain Call with any questions or concerns Hang in there!!!

## 2023-02-03 NOTE — Progress Notes (Signed)
   Subjective:    Patient ID: Lauren Small, female    DOB: [birth year], 37 y.o.   MRN: [id number]  HPI L ear pain- sxs started a few days ago.  Initially had swollen LN's but these have improved.  Pain is temporarily dulled w/ tylenol or motrin but not relieved.  Pain radiates into jaw.  No fevers.  No drainage from ear.  Not painful to chew.  Able to open mouth normally.  + sinus pressure and congestion.   Review of Systems For ROS see HPI     Objective:   Physical Exam Vitals reviewed.  Constitutional:      General: She is not in acute distress.    Appearance: Normal appearance. She is well-developed. She is not ill-appearing.  HENT:     Head: Normocephalic and atraumatic.     Right Ear: Tympanic membrane and ear canal normal.     Left Ear: Tympanic membrane and ear canal normal.     Nose: Mucosal edema and congestion present. No rhinorrhea.     Right Sinus: No maxillary sinus tenderness or frontal sinus tenderness.     Left Sinus: Maxillary sinus tenderness present. No frontal sinus tenderness.     Mouth/Throat:     Pharynx: Uvula midline. Posterior oropharyngeal erythema present. No oropharyngeal exudate.  Eyes:     Conjunctiva/sclera: Conjunctivae normal.     Pupils: Pupils are equal, round, and reactive to light.  Cardiovascular:     Rate and Rhythm: Normal rate and regular rhythm.     Heart sounds: Normal heart sounds.  Pulmonary:     Effort: Pulmonary effort is normal. No respiratory distress.     Breath sounds: Normal breath sounds. No wheezing.  Musculoskeletal:     Cervical back: Normal range of motion and neck supple.  Lymphadenopathy:     Cervical: No cervical adenopathy.  Skin:    General: Skin is warm and dry.  Neurological:     General: No focal deficit present.     Mental Status: She is alert and oriented to person, place, and time.     Cranial Nerves: No cranial nerve deficit.     Motor: No weakness.     Coordination: Coordination normal.   Psychiatric:        Mood and Affect: Mood normal.        Behavior: Behavior normal.        Thought Content: Thought content normal.           Assessment & Plan:  L maxillary sinusitis- new.  Pt's sxs and PE consistent w/ infxn.  Start Amoxicillin BID.  Reviewed supportive care and red flags that should prompt return.  Pt expressed understanding and is in agreement w/ plan.

## 2023-02-11 ENCOUNTER — Encounter: Payer: Self-pay | Admitting: Family Medicine

## 2023-02-11 DIAGNOSIS — E039 Hypothyroidism, unspecified: Secondary | ICD-10-CM

## 2023-02-11 NOTE — Telephone Encounter (Signed)
Pt is asking about potential causes to her weight gain as there have been a few changes recently from surgery in Dec to starting a new thyroid med   Please advise

## 2023-02-13 ENCOUNTER — Encounter: Payer: Self-pay | Admitting: Family Medicine

## 2023-02-14 MED ORDER — DOXYCYCLINE HYCLATE 100 MG PO TABS: 100.0000 mg | ORAL_TABLET | Freq: Two times a day (BID) | ORAL | 0 refills | Status: DC

## 2023-02-14 NOTE — Telephone Encounter (Signed)
I thought I had sent this message to you yesterday and it apparently did not route, can you advise anything additional or should we get a follow up visit

## 2023-02-25 ENCOUNTER — Other Ambulatory Visit (INDEPENDENT_AMBULATORY_CARE_PROVIDER_SITE_OTHER): Payer: BC Managed Care – PPO

## 2023-02-25 ENCOUNTER — Other Ambulatory Visit: Payer: Self-pay

## 2023-02-25 DIAGNOSIS — E039 Hypothyroidism, unspecified: Secondary | ICD-10-CM | POA: Diagnosis not present

## 2023-02-25 LAB — T4, FREE: Free T4: 0.83 ng/dL (ref 0.60–1.60)

## 2023-02-25 LAB — T3, FREE: T3, Free: 3.7 pg/mL (ref 2.3–4.2)

## 2023-02-25 LAB — TSH: TSH: 0.75 u[IU]/mL (ref 0.35–5.50)

## 2023-02-25 MED ORDER — THYROID 15 MG PO TABS: 15.0000 mg | ORAL_TABLET | Freq: Every day | ORAL | 1 refills | Status: DC

## 2023-02-26 ENCOUNTER — Telehealth: Payer: Self-pay

## 2023-02-26 NOTE — Telephone Encounter (Signed)
Pt seen results Via my chart  

## 2023-02-26 NOTE — Telephone Encounter (Signed)
-----   Message from Midge Minium, MD sent at 02/26/2023  7:13 AM EST ----- Thankfully TSH, free T3, and free T4 are all well within range.  This is good news!

## 2023-02-27 ENCOUNTER — Ambulatory Visit: Payer: BC Managed Care – PPO | Admitting: Family Medicine

## 2023-02-27 ENCOUNTER — Encounter: Payer: Self-pay | Admitting: Family Medicine

## 2023-02-27 VITALS — BP 116/72 | HR 86 | Temp 97.8°F | Resp 16 | Ht 66.5 in | Wt 148.0 lb

## 2023-02-27 DIAGNOSIS — F32A Depression, unspecified: Secondary | ICD-10-CM | POA: Diagnosis not present

## 2023-02-27 DIAGNOSIS — E039 Hypothyroidism, unspecified: Secondary | ICD-10-CM

## 2023-02-27 DIAGNOSIS — F419 Anxiety disorder, unspecified: Secondary | ICD-10-CM | POA: Diagnosis not present

## 2023-02-27 MED ORDER — FLUOXETINE HCL 10 MG PO CAPS: 10.0000 mg | ORAL_CAPSULE | Freq: Every day | ORAL | 3 refills | Status: DC

## 2023-02-27 MED ORDER — LIOTHYRONINE SODIUM 5 MCG PO TABS: 5.0000 ug | ORAL_TABLET | Freq: Every day | ORAL | 3 refills | Status: DC

## 2023-02-27 NOTE — Patient Instructions (Signed)
Follow up in 4 weeks to recheck mood START the Fluoxetine (Prozac) once daily Make sure you are taking time for you!!!  You deserve it! Call with any questions or concerns Stay Safe!  Stay Healthy! You've Got This!!!

## 2023-02-27 NOTE — Progress Notes (Signed)
   Subjective:    Patient ID: Lauren Small, female    DOB: [birth year], 37 y.o.   MRN: [id number]  HPI Hypothyroid- ongoing issue for pt.  Currently on Armour Thyroid '45mg'$  daily and Cytomel 45mg daily.  Most recent labs done 3/5 WNL.  +PHQ 9- score today 11.  Pt reports similar symptoms to post-partum.  Pt reports that work has been extremely stressful but starting to see the light at the end of the tunnel.  Home relationships are good.  + fatigue, tearfulness, headaches from clenching jaw.   Review of Systems For ROS see HPI     Objective:   Physical Exam Vitals reviewed.  Constitutional:      General: She is not in acute distress.    Appearance: Normal appearance. She is not ill-appearing.  HENT:     Head: Normocephalic and atraumatic.  Eyes:     Extraocular Movements: Extraocular movements intact.     Conjunctiva/sclera: Conjunctivae normal.     Pupils: Pupils are equal, round, and reactive to light.  Skin:    General: Skin is warm and dry.  Neurological:     General: No focal deficit present.     Mental Status: She is alert and oriented to person, place, and time.  Psychiatric:        Behavior: Behavior normal.        Thought Content: Thought content normal.     Comments: Tearful, anxious           Assessment & Plan:

## 2023-02-27 NOTE — Assessment & Plan Note (Signed)
Chronic problem.  Labs done on 3/5 were WNL.  Suspect that a lot of sxs she feels are thyroid related are actually due to depression.  Starting tx for that and will follow.

## 2023-02-27 NOTE — Assessment & Plan Note (Signed)
New.  Pt's PHQ9=11 today.  She admits that she's having a hard time and coming off a very stressful time at work.  Is starting to see the light at the end of the tunnel but I told her she has not processed all the stress and fatigue of that time yet.  She is having multiple sxs of depression- excessive fatigue, tearfulness, HA, weight gain, increased sleep- and admits this feels similar to her post partum depression.  Had long discussion about mood, medication, treatment plan.  Pt is agreeable to start medication in hopes of feeling better.  Will follow.

## 2023-03-03 ENCOUNTER — Encounter: Payer: Self-pay | Admitting: Family Medicine

## 2023-03-03 ENCOUNTER — Ambulatory Visit: Payer: BC Managed Care – PPO | Admitting: Family Medicine

## 2023-03-03 VITALS — BP 116/68 | HR 72 | Temp 98.0°F | Resp 16 | Ht 66.5 in | Wt 148.4 lb

## 2023-03-03 DIAGNOSIS — L271 Localized skin eruption due to drugs and medicaments taken internally: Secondary | ICD-10-CM | POA: Diagnosis not present

## 2023-03-03 NOTE — Progress Notes (Signed)
   Subjective:    Patient ID: Lauren Small, female    DOB: [birth year], 37 y.o.   MRN: [id number]  HPI Hives- pt started Prozac on Thursday.  Pt reports she has had similar sxs in the past and was dx'd as 'fixed drug eruption'.  At that time they thought it was possibly caused by Phenylephrine.  Saturday developed itching on R shoulder and yesterday spread to L wrist, R buttock.  Also has irritated tongue and vaginal itching.  She just completed a course of Doxy.   Review of Systems For ROS see HPI     Objective:   Physical Exam Vitals reviewed.  Constitutional:      General: She is not in acute distress.    Appearance: Normal appearance. She is not ill-appearing.  HENT:     Head: Normocephalic and atraumatic.  Skin:    General: Skin is warm and dry.     Findings: Erythema (well circumscribed, deeply erythematous ovoid lesion on R buttock and L ventral wrist) present.  Neurological:     General: No focal deficit present.     Mental Status: She is alert and oriented to person, place, and time.  Psychiatric:        Mood and Affect: Mood normal.        Behavior: Behavior normal.        Thought Content: Thought content normal.           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  After some chart review, we found that she had also taken Doxy when she experienced her first FDE back in summer of 2022.  I do not think this is related to the Fluoxetine as the Doxy would be a much more common trigger.  This was added to her allergy list.  Pt to use the Triamcinolone that she has at home and add a daily antihistamine.  Pt expressed understanding and is in agreement w/ plan.

## 2023-03-03 NOTE — Patient Instructions (Signed)
Follow up as needed or as scheduled START using the Triamcinolone twice daily- thin layer ADD Claritin or Zyrtec to help w/ itching and block that histamine response I don't think it's the Fluoxetine and suspect Doxycycline Call with any questions or concerns Hang in there!!!

## 2023-03-12 ENCOUNTER — Ambulatory Visit: Payer: BC Managed Care – PPO | Admitting: Family Medicine

## 2023-03-27 ENCOUNTER — Ambulatory Visit: Payer: BC Managed Care – PPO | Admitting: Family Medicine

## 2023-03-27 ENCOUNTER — Encounter: Payer: Self-pay | Admitting: Family Medicine

## 2023-03-27 VITALS — BP 110/70 | HR 68 | Temp 97.9°F | Resp 16 | Ht 66.5 in | Wt 148.1 lb

## 2023-03-27 DIAGNOSIS — F419 Anxiety disorder, unspecified: Secondary | ICD-10-CM | POA: Diagnosis not present

## 2023-03-27 DIAGNOSIS — F32A Depression, unspecified: Secondary | ICD-10-CM | POA: Diagnosis not present

## 2023-03-27 MED ORDER — FLUOXETINE HCL 20 MG PO CAPS: 20.0000 mg | ORAL_CAPSULE | Freq: Every day | ORAL | 3 refills | Status: DC

## 2023-03-27 NOTE — Progress Notes (Signed)
   Subjective:    Patient ID: Lauren Small, female    DOB: 11-23-1986, 37 y.o.   MRN: [id number]  HPI Anxiety/Depression- pt is currently on Fluoxetine  daily.  Pt reports feeling 'a lot better'.  Continues to have anxiety but 'sadness is gone'.  Pt feels she is snapping less, less irritable.  Has catastrophic thinking- particularly regarding her girls  Wants to go up on dose in hopes of improving the anxiety   Review of Systems For ROS see HPI     Objective:   Physical Exam Vitals reviewed.  Constitutional:      General: She is not in acute distress.    Appearance: Normal appearance. She is not ill-appearing.  HENT:     Head: Normocephalic and atraumatic.  Cardiovascular:     Rate and Rhythm: Normal rate and regular rhythm.  Skin:    General: Skin is warm and dry.  Neurological:     General: No focal deficit present.     Mental Status: She is alert and oriented to person, place, and time.  Psychiatric:        Mood and Affect: Mood normal.        Behavior: Behavior normal.        Thought Content: Thought content normal.           Assessment & Plan:

## 2023-03-27 NOTE — Patient Instructions (Addendum)
Follow up as needed Message me in 4-6 to let me know how the new dose is working INCREASE to 20mg  daily- 2 of what you have at home and 1 of the new prescription Call with any questions or concerns Hang in there!!!

## 2023-03-30 NOTE — Assessment & Plan Note (Signed)
Pt reports she is physically feeling better and depression/sadness has improved.  Continues to struggle w/ anxiety.  Will increase fluoxetine to 20mg  daily and monitor for improvement.  If anxiety persists, will consider adding Buspar.  Pt expressed understanding and is in agreement w/ plan.

## 2023-04-07 ENCOUNTER — Encounter: Payer: Self-pay | Admitting: *Deleted

## 2023-04-21 ENCOUNTER — Other Ambulatory Visit: Payer: Self-pay | Admitting: Family Medicine

## 2023-04-21 DIAGNOSIS — E039 Hypothyroidism, unspecified: Secondary | ICD-10-CM

## 2023-05-02 DIAGNOSIS — M25562 Pain in left knee: Secondary | ICD-10-CM | POA: Diagnosis not present

## 2023-05-06 DIAGNOSIS — M25562 Pain in left knee: Secondary | ICD-10-CM | POA: Insufficient documentation

## 2023-05-22 DIAGNOSIS — Z6824 Body mass index (BMI) 24.0-24.9, adult: Secondary | ICD-10-CM | POA: Diagnosis not present

## 2023-05-22 DIAGNOSIS — Z01419 Encounter for gynecological examination (general) (routine) without abnormal findings: Secondary | ICD-10-CM | POA: Diagnosis not present

## 2023-06-10 ENCOUNTER — Other Ambulatory Visit: Payer: Self-pay | Admitting: Family

## 2023-06-10 DIAGNOSIS — L819 Disorder of pigmentation, unspecified: Secondary | ICD-10-CM

## 2023-06-12 ENCOUNTER — Ambulatory Visit: Payer: BC Managed Care – PPO | Admitting: Family Medicine

## 2023-06-12 ENCOUNTER — Encounter: Payer: Self-pay | Admitting: Family Medicine

## 2023-06-12 VITALS — BP 120/78 | HR 78 | Temp 98.2°F | Resp 17 | Ht 66.5 in | Wt 146.4 lb

## 2023-06-12 DIAGNOSIS — R42 Dizziness and giddiness: Secondary | ICD-10-CM | POA: Diagnosis not present

## 2023-06-12 DIAGNOSIS — R21 Rash and other nonspecific skin eruption: Secondary | ICD-10-CM

## 2023-06-12 MED ORDER — MECLIZINE HCL 25 MG PO TABS: 25.0000 mg | ORAL_TABLET | Freq: Three times a day (TID) | ORAL | 0 refills | Status: DC | PRN

## 2023-06-12 NOTE — Progress Notes (Signed)
Subjective:    Patient ID: Lauren Small, female    DOB: 02-Jul-1986, 37 y.o.   MRN: [id number]  HPI Dizziness- pt reports sxs started in May (5/22) when lying in bed watching TV, 'the whole room spun'.  Sxs seem to occur intermittently and can be more than once daily.  Sxs have occurred in different positions- lying, sitting, standing.  No clear relation to movement of head. Some associated nausea, 'it feels like motion sickness'.  No hx of similar.  No recent head injury.  Pt has excellent water intake.  When sxs first occurred in May she was having sinus pressure and was told by ENT she was having an 'atypical migraine'.  Will randomly have associated HA's.    R breast redness- pt saw GYN and has been applying hydrocortisone cream twice daily w/o relief.  No obvious lumps or bumps.  Referral has been placed for mammo.      Review of Systems For ROS see HPI     Objective:   Physical Exam Vitals reviewed.  Constitutional:      General: She is not in acute distress.    Appearance: Normal appearance. She is not ill-appearing.  HENT:     Head: Normocephalic and atraumatic.     Right Ear: Tympanic membrane and ear canal normal.     Left Ear: Tympanic membrane and ear canal normal.     Nose: No congestion.     Comments: No frontal or maxillary sinus pain Eyes:     Extraocular Movements: Extraocular movements intact.     Conjunctiva/sclera: Conjunctivae normal.     Pupils: Pupils are equal, round, and reactive to light.  Cardiovascular:     Rate and Rhythm: Normal rate and regular rhythm.  Pulmonary:     Effort: Pulmonary effort is normal. No respiratory distress.     Breath sounds: No wheezing or rhonchi.  Musculoskeletal:     Cervical back: Normal range of motion and neck supple.  Lymphadenopathy:     Cervical: No cervical adenopathy.  Skin:    General: Skin is warm and dry.     Findings: Erythema (faint erythema over R inferior breast w/o skin dimpling or lump present)  present.  Neurological:     General: No focal deficit present.     Mental Status: She is alert.     Cranial Nerves: No cranial nerve deficit.     Motor: No weakness.     Gait: Gait normal.     Deep Tendon Reflexes: Reflexes normal.  Psychiatric:        Mood and Affect: Mood normal.        Behavior: Behavior normal.        Thought Content: Thought content normal.           Assessment & Plan:   Dizziness- new.  Pt's description of sxs are most consistent w/ BPV, although atypical migraine is a possibility.  No focal finding on neuro exam so mass or space occupying lesion is less likely.  Reviewed BPV dx w/ pt and talked about supportive care- meclizine, fluid intake, allergy medication to decrease congestion.  Handout provided for modified Epley Maneuver.  If no improvement she is to let me know so we can either refer to Neuro or image.  Pt expressed understanding and is in agreement w/ plan.   Skin changes- new.  R breast.  Seems more consistent w/ heat reaction, dry skin, or contact dermatitis.  No obvious skin dimpling.  Referral  has already been made for diagnostic mammo.  Encouraged her to continue topical steroid twice daily.  Will follow.

## 2023-06-12 NOTE — Patient Instructions (Addendum)
Follow up as needed or as scheduled Continue to drink LOTS of water TAKE Zyrtec (Cetirizine) once daily to help w/ congestion and dizziness USE the Meclizine as needed for dizziness Do the exercises to help desensitize the inner ear This may be migraine related so please keep a log of when you have the dizziness and whether you have an associated headache Change positions slowly to allow yourself time to adjust If symptoms change, worsen, or fail to improve- please let me know so we can investigate further Call with any questions or concerns Hang in there!!!

## 2023-06-20 ENCOUNTER — Other Ambulatory Visit: Payer: Self-pay | Admitting: Family Medicine

## 2023-06-20 DIAGNOSIS — E039 Hypothyroidism, unspecified: Secondary | ICD-10-CM

## 2023-07-01 ENCOUNTER — Ambulatory Visit
Admission: RE | Admit: 2023-07-01 | Discharge: 2023-07-01 | Disposition: A | Discharge status: Home / Self Care | Payer: BC Managed Care – PPO | Source: Ambulatory Visit | Attending: Family | Admitting: Family

## 2023-07-01 DIAGNOSIS — L819 Disorder of pigmentation, unspecified: Secondary | ICD-10-CM

## 2023-07-01 DIAGNOSIS — N6459 Other signs and symptoms in breast: Secondary | ICD-10-CM | POA: Diagnosis not present

## 2023-07-04 ENCOUNTER — Other Ambulatory Visit: Payer: Self-pay | Admitting: Family

## 2023-07-04 DIAGNOSIS — N6459 Other signs and symptoms in breast: Secondary | ICD-10-CM

## 2023-07-21 ENCOUNTER — Other Ambulatory Visit: Payer: Self-pay

## 2023-07-21 MED ORDER — FLUOXETINE HCL 20 MG PO CAPS: 20.0000 mg | ORAL_CAPSULE | Freq: Every day | ORAL | 3 refills | Status: DC

## 2023-07-21 MED ORDER — LIOTHYRONINE SODIUM 5 MCG PO TABS: 5.0000 ug | ORAL_TABLET | Freq: Every day | ORAL | 3 refills | Status: DC

## 2023-08-16 ENCOUNTER — Ambulatory Visit
Admission: RE | Admit: 2023-08-16 | Discharge: 2023-08-16 | Disposition: A | Discharge status: Home / Self Care | Payer: BC Managed Care – PPO | Source: Ambulatory Visit | Attending: Family | Admitting: Family

## 2023-08-16 DIAGNOSIS — R238 Other skin changes: Secondary | ICD-10-CM | POA: Diagnosis not present

## 2023-08-16 DIAGNOSIS — N6459 Other signs and symptoms in breast: Secondary | ICD-10-CM

## 2023-08-16 MED ORDER — GADOPICLENOL 0.5 MMOL/ML IV SOLN
7.0000 mL | Freq: Once | INTRAVENOUS | Status: AC | PRN
  Administered 2023-08-16: 7 mL via INTRAVENOUS

## 2023-08-20 DIAGNOSIS — L219 Seborrheic dermatitis, unspecified: Secondary | ICD-10-CM | POA: Diagnosis not present

## 2023-08-20 DIAGNOSIS — L309 Dermatitis, unspecified: Secondary | ICD-10-CM | POA: Diagnosis not present

## 2023-08-20 DIAGNOSIS — L271 Localized skin eruption due to drugs and medicaments taken internally: Secondary | ICD-10-CM | POA: Diagnosis not present

## 2023-08-20 DIAGNOSIS — K7689 Other specified diseases of liver: Secondary | ICD-10-CM | POA: Insufficient documentation

## 2023-08-21 ENCOUNTER — Telehealth: Payer: Self-pay | Admitting: Gastroenterology

## 2023-08-21 NOTE — Telephone Encounter (Signed)
Okay, thanks for clarifying, I was able to see it.  This is not an urgent consult if only for imaging findings.  There is a small benign appearing suspected cyst in the liver that was seen incidentally on a breast MRI.  She had a similar finding on a CT scan over a year ago.  You can book her routine follow-up with me, unless she is having symptoms that warrant emergent visit otherwise.  If only for this small cyst in her liver it is nonurgent and I think okay to wait till the next routine available visit.  Thanks

## 2023-08-21 NOTE — Telephone Encounter (Signed)
I do not see any recent imaging on file in regards to a liver cyst.  She had one remotely on imaging in the past.  Are there any records regarding recent imaging she has had, or office visit from of the referral that I can review?  Not sure why this would require an urgent visit unless something has changed since I have last seen her

## 2023-08-21 NOTE — Telephone Encounter (Signed)
Urgent referral in [place] for a liver cyst.  Last saw Dr. Adela Lank 07/2022.  Please review and advise scheduling and urgency.   Thanks

## 2023-08-26 ENCOUNTER — Other Ambulatory Visit: Payer: Self-pay | Admitting: Family Medicine

## 2023-08-26 DIAGNOSIS — E039 Hypothyroidism, unspecified: Secondary | ICD-10-CM

## 2023-08-27 ENCOUNTER — Other Ambulatory Visit: Payer: Self-pay | Admitting: Family Medicine

## 2023-08-29 DIAGNOSIS — E538 Deficiency of other specified B group vitamins: Secondary | ICD-10-CM | POA: Diagnosis not present

## 2023-08-29 DIAGNOSIS — R6882 Decreased libido: Secondary | ICD-10-CM | POA: Diagnosis not present

## 2023-08-29 DIAGNOSIS — R5383 Other fatigue: Secondary | ICD-10-CM | POA: Diagnosis not present

## 2023-08-29 DIAGNOSIS — E559 Vitamin D deficiency, unspecified: Secondary | ICD-10-CM | POA: Diagnosis not present

## 2023-08-29 DIAGNOSIS — E039 Hypothyroidism, unspecified: Secondary | ICD-10-CM | POA: Diagnosis not present

## 2023-08-29 DIAGNOSIS — E612 Magnesium deficiency: Secondary | ICD-10-CM | POA: Diagnosis not present

## 2023-09-02 ENCOUNTER — Other Ambulatory Visit: Payer: Self-pay | Admitting: Family Medicine

## 2023-09-02 DIAGNOSIS — R10811 Right upper quadrant abdominal tenderness: Secondary | ICD-10-CM

## 2023-10-09 ENCOUNTER — Ambulatory Visit: Payer: BC Managed Care – PPO | Admitting: Physician Assistant

## 2023-11-08 IMAGING — US US ABDOMEN LIMITED
1 series · 14 of 25 positions shown · non-contrast
Comparison: None.

CLINICAL DATA: Chronic right upper quadrant pain.

EXAM:
ULTRASOUND ABDOMEN LIMITED RIGHT UPPER QUADRANT

[Series 1: us abdomen limited · 0.23mm/px · 14 of 40 slices shown]
[im 1/40]
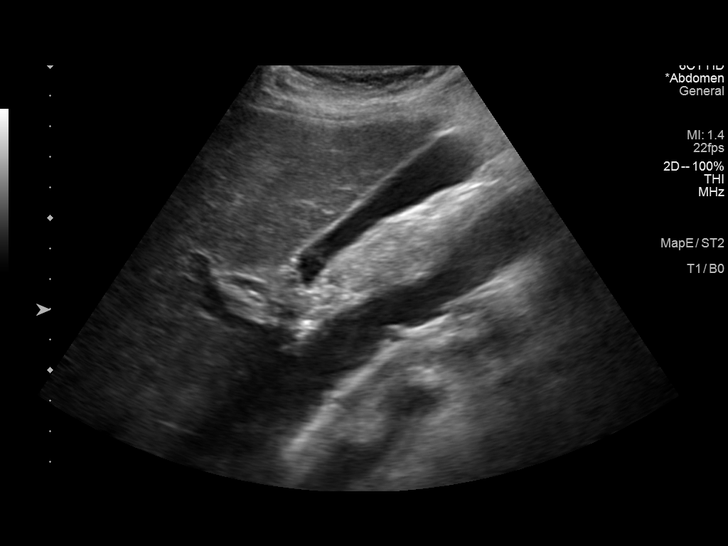
[im 4/40]
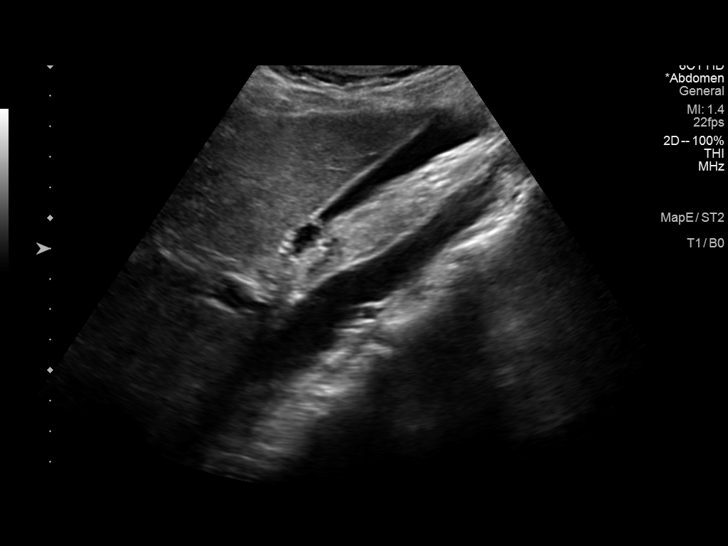
[im 7/40]
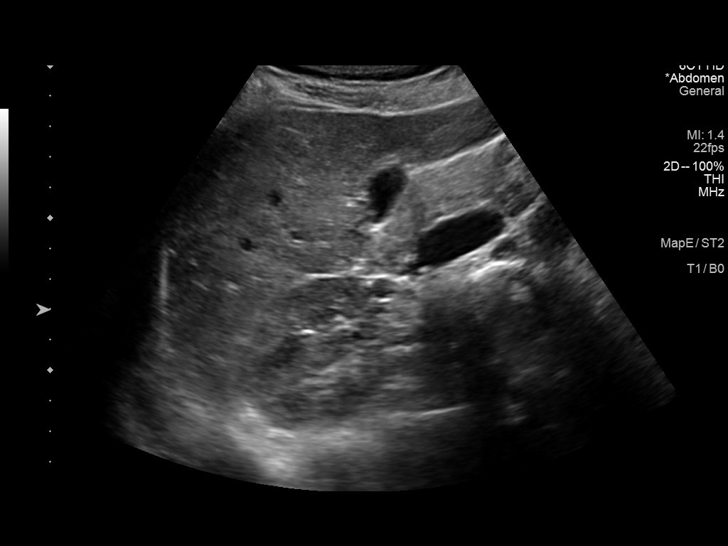
[im 10/40]
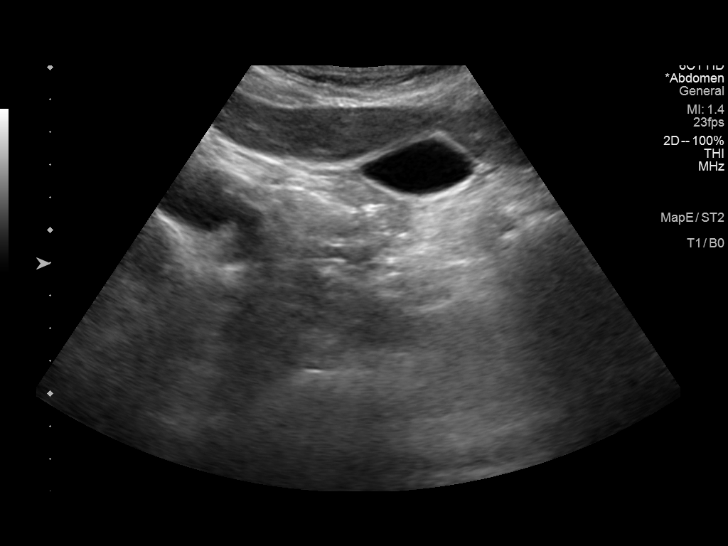
[im 14/40]
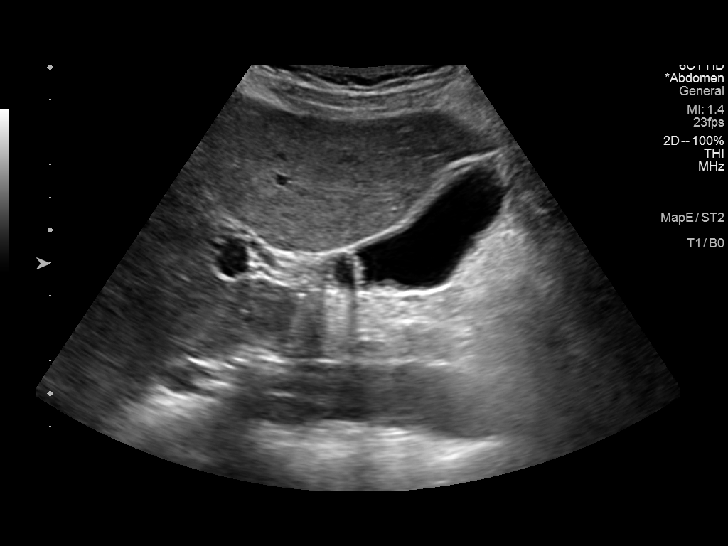
[im 15/40]
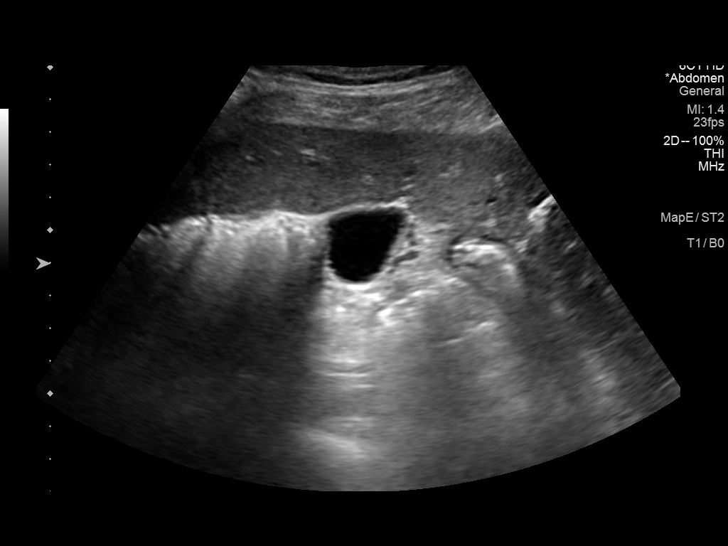
[im 18/40]
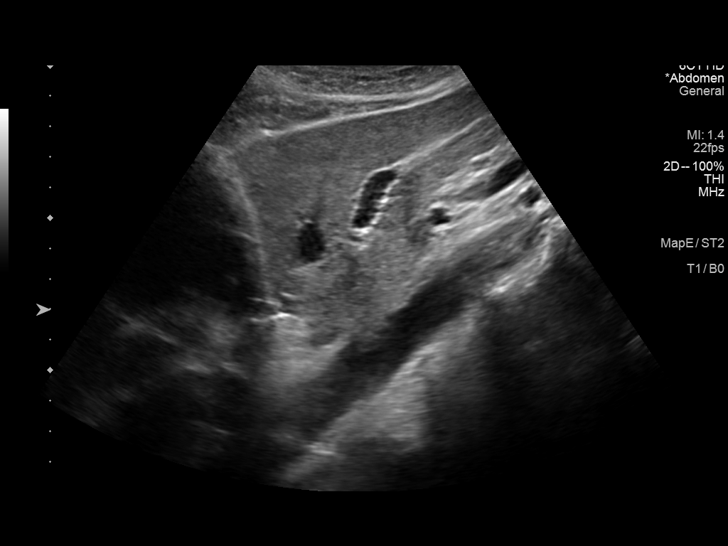
[im 22/40]
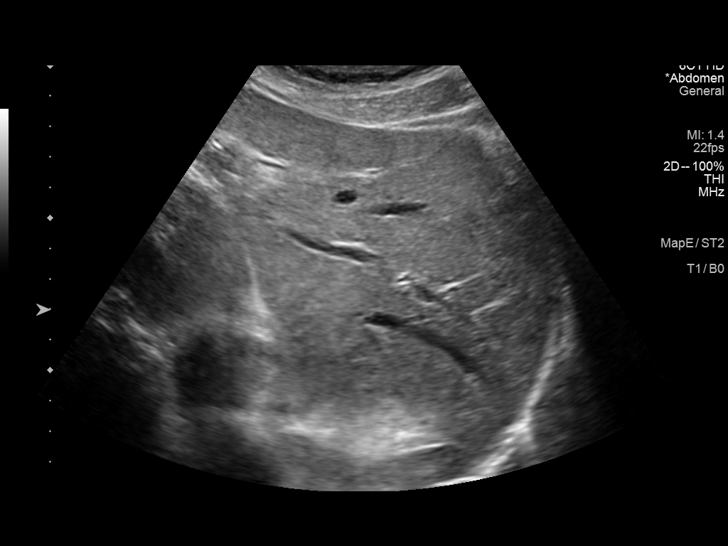
[im 25/40]
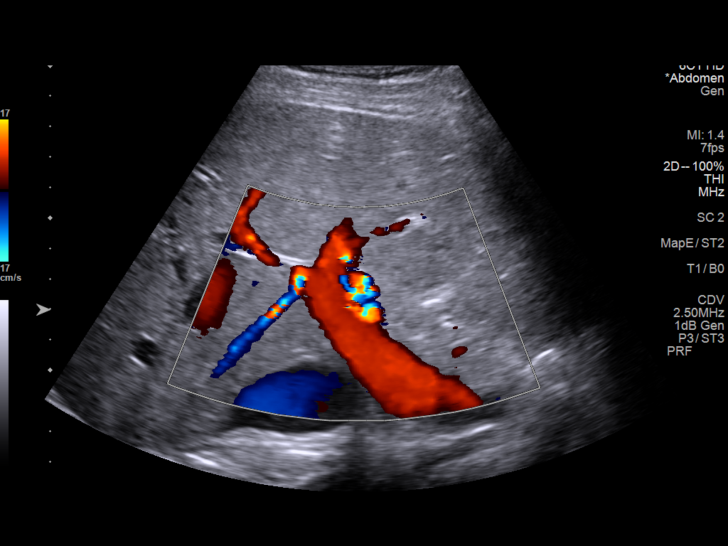
[im 27/40]
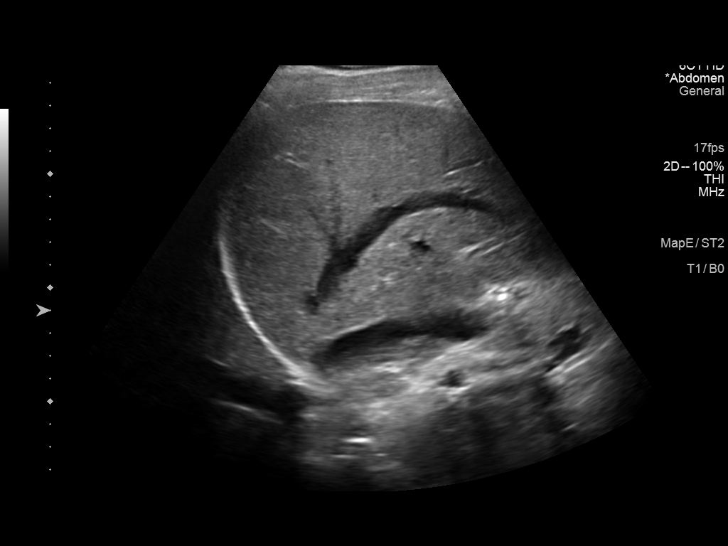
[im 30/40]
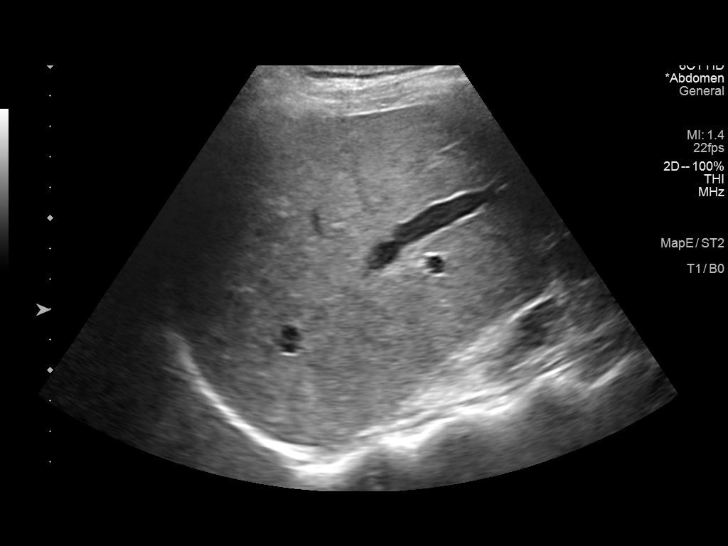
[im 33/40]
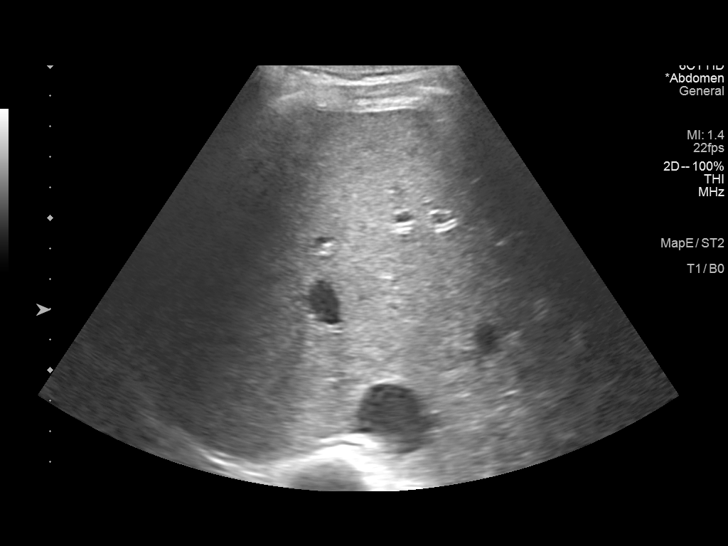
[im 36/40]
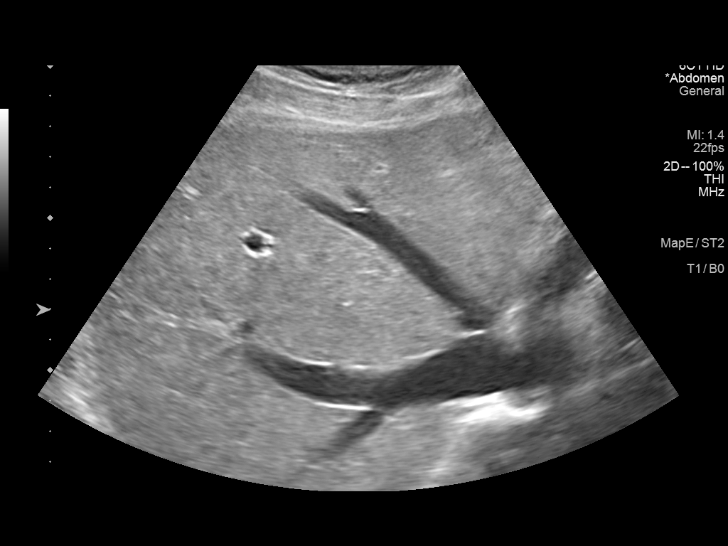
[im 40/40]
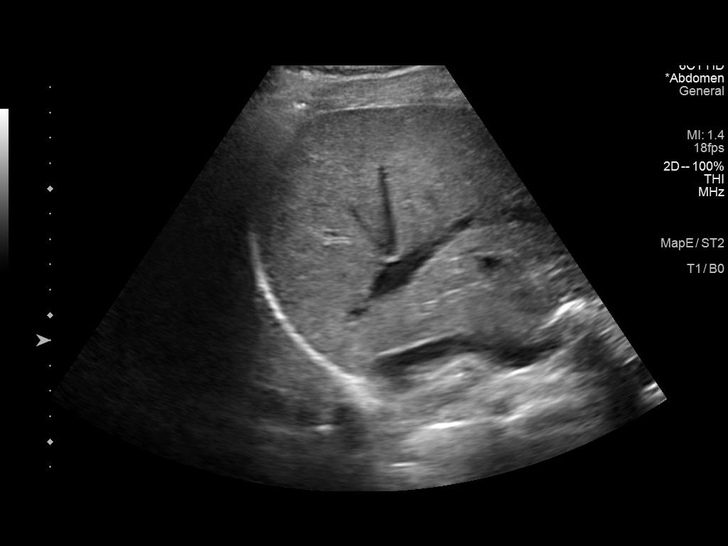

[14 of 25 positions shown; findings below may reference images not displayed]

FINDINGS: Gallbladder:

Gallbladder incompletely distended without evidence for gallstones.
No evidence for gallbladder wall thickening or pericholecystic
fluid. Sonographer reports no sonographic Murphy sign.

Common bile duct:

Diameter: 2-3 mm

Liver:

No focal lesion identified. Within normal limits in parenchymal
echogenicity. Portal vein is patent on color Doppler imaging with
normal direction of blood flow towards the liver.

Other: None.
IMPRESSION: Unremarkable study. No findings to explain the patient's history of
pain.

## 2023-11-24 ENCOUNTER — Other Ambulatory Visit: Payer: Self-pay

## 2023-11-24 MED ORDER — FLUOXETINE HCL 20 MG PO CAPS: 20.0000 mg | ORAL_CAPSULE | Freq: Every day | ORAL | 3 refills | Status: DC

## 2024-01-06 NOTE — Progress Notes (Signed)
Office Visit Note  Patient: Lauren Small             Date of Birth: [birth year]           MRN: [id number]             PCP: Sheliah Hatch, MD Referring: Sheliah Hatch, MD Visit Date: 01/20/2024 Occupation: @GUAROCC @  Subjective:  Pain in legs and pelvis  History of Present Illness: Lauren Small is a 38 y.o. female with history of positive ANA.  She returns today after her last visit in January 2024.  She states for the last 3 days she started having pain and discomfort in her pelvic region, lower back and her lower extremitie  She states the pain started in her calves and her lower extremities which gradually moved onto the pelvic region and the lower back.  She states the pain feels like it is radiating down into her legs down.  She does not recall any injury.  There has been no history of any recent illness.  She has history of IBS.  She states currently she is not having diarrhea or constipation.  She is also seeing a gastroenterologist for early lesion on the liver.  The workup is pending.  She was found to have anemia and Hemoccult test is pending.    Activities of Daily Living:  Patient reports morning stiffness for 5 minutes.   Patient Denies nocturnal pain.  Difficulty dressing/grooming: Denies Difficulty climbing stairs: Denies Difficulty getting out of chair: Denies Difficulty using hands for taps, buttons, cutlery, and/or writing: Denies  Review of Systems  Constitutional:  Positive for fatigue.  HENT:  Negative for mouth sores and mouth dryness.   Eyes:  Negative for dryness.  Respiratory:  Negative for shortness of breath.   Cardiovascular:  Negative for chest pain and palpitations.  Gastrointestinal:  Positive for constipation and diarrhea. Negative for blood in stool.  Endocrine: Negative for increased urination.  Genitourinary:  Negative for involuntary urination.  Musculoskeletal:  Positive for joint pain, joint pain, myalgias, morning  stiffness, muscle tenderness and myalgias. Negative for gait problem, joint swelling and muscle weakness.  Skin:  Positive for hair loss. Negative for color change, rash and sensitivity to sunlight.  Allergic/Immunologic: Positive for susceptible to infections.  Neurological:  Positive for dizziness and headaches.  Hematological:  Negative for swollen glands.  Psychiatric/Behavioral:  Positive for depressed mood. Negative for sleep disturbance. The patient is nervous/anxious.     PMFS History:  Patient Active Problem List   Diagnosis Date Noted   Anxiety and depression 02/27/2023   Seasonal allergies 12/04/2022   Mild intermittent asthma without complication 12/04/2022   Hypothyroid 08/23/2022    Past Medical History:  Diagnosis Date   Allergy    Anemia    Asthma    Blood in stool    GERD (gastroesophageal reflux disease)    History of chicken pox     Family History  Problem Relation Age of Onset   Thyroid disease Mother    Early death Father    Asthma Sister    Diabetes Sister        type 1   Asthma Sister    Early death Maternal Grandmother    Heart attack Maternal Grandfather    Hyperlipidemia Maternal Grandfather    Hypertension Maternal Grandfather    Healthy Daughter    Healthy Daughter    Past Surgical History:  Procedure Laterality Date   CHOLECYSTECTOMY  12/12/2022   urethra  polyp     WISDOM TOOTH EXTRACTION     Social History   Social History Narrative   Not on file   Immunization History  Administered Date(s) Administered   Influenza,inj,Quad PF,6+ Mos 09/17/2018, 08/27/2019, 09/22/2020   Influenza,inj,quad, With Preservative 09/20/2015, 10/23/2016, 08/23/2017   Influenza-Unspecified 08/23/2013, 10/23/2021   PFIZER(Purple Top)SARS-COV-2 Vaccination 02/16/2020, 03/08/2020   Tdap 06/17/2014, 08/07/2015, 06/23/2019     Objective: Vital Signs: BP 122/73 (BP Location: Left Arm, Patient Position: Sitting, Cuff Size: Normal)   Pulse 76   Resp 14    Ht 5' 6.5" (1.689 m)   Wt 150 lb (68 kg)   BMI 23.85 kg/m    Physical Exam Vitals and nursing note reviewed.  Constitutional:      Appearance: She is well-developed.  HENT:     Head: Normocephalic and atraumatic.  Eyes:     Conjunctiva/sclera: Conjunctivae normal.  Cardiovascular:     Rate and Rhythm: Normal rate and regular rhythm.     Heart sounds: Normal heart sounds.  Pulmonary:     Effort: Pulmonary effort is normal.     Breath sounds: Normal breath sounds.  Abdominal:     General: Bowel sounds are normal.     Palpations: Abdomen is soft.     Comments: Tenderness in the lower abdominal region.  Musculoskeletal:     Cervical back: Normal range of motion.  Lymphadenopathy:     Cervical: No cervical adenopathy.  Skin:    General: Skin is warm and dry.     Capillary Refill: Capillary refill takes less than 2 seconds.  Neurological:     Mental Status: She is alert and oriented to person, place, and time.  Psychiatric:        Behavior: Behavior normal.      Musculoskeletal Exam: Cervical, thoracic and lumbar spine with good range of motion without any point tenderness.  Shoulders, elbows, wrist joints, MCPs PIPs and DIPs with good range of motion with no synovitis.  Hip joints and knee joints in good range of motion without any warmth swelling or effusion.  There was no tenderness over ankles or MTPs.  She had no difficulty walking on her tiptoes, heels and getting up from the squatting position.  No muscular tenderness or weakness was noted.  CDAI Exam: CDAI Score: -- Patient Global: --; Provider Global: -- Swollen: --; Tender: -- Joint Exam 01/20/2024   No joint exam has been documented for this visit   There is currently no information documented on the homunculus. Go to the Rheumatology activity and complete the homunculus joint exam.  Investigation: No additional findings.  Imaging: No results found.  Recent Labs: Lab Results  Component Value Date   WBC  5.8 01/12/2024   HGB 11.6 (L) 01/12/2024   PLT 285 01/12/2024   NA 138 01/12/2024   K 4.5 01/12/2024   CL 106 01/12/2024   CO2 28 01/12/2024   GLUCOSE 83 01/12/2024   BUN 15 01/12/2024   CREATININE 0.92 01/12/2024   BILITOT 1.4 (H) 01/12/2024   ALKPHOS 42 08/23/2022   AST 13 01/12/2024   ALT 11 01/12/2024   PROT 6.5 01/12/2024   ALBUMIN 4.5 08/23/2022   CALCIUM 9.2 01/12/2024   GFRAA >60 09/14/2020    Speciality Comments: No specialty comments available.  Procedures:  No procedures performed Allergies: Erythromycin, Erythromycin base, Azithromycin, Ciprofloxacin, Doxycycline, Gadolinium derivatives, and Tramadol   Assessment / Plan:     Visit Diagnoses: Positive ANA (antinuclear antibody) -Labs obtained on January 12, 2024 were reviewed.  ANA 1: 160 nucleolar, 1: 40 NH, C3-C4 normal, dsDNA negative, sed rate normal, protein creatinine ratio normal.  Lab results were reviewed with the patient.  Patient denies any recent problems with oral ulcers, nasal ulcers, malar rash, full sensitivity, Raynaud's, lymphadenopathy or inflammatory arthritis.  Advised patient to contact me if she develops any new symptoms.  Trapezius muscle spasm-she is not mitten trapezius spasm with stress.  Chronic pain of both hips -she has some discomfort with range of motion of her left hip joint which is mostly due to muscle tightness.  She good range of motion of the hip joints.  X-rays obtained in the past were unremarkable.  Chronic pain of both knees -both knee joints were in good range of motion without any warmth swelling or effusion.  She had no recent discomfort in her knee joints.  X-rays obtained in the past were unremarkable.  Chronic midline lower back pain without sciatica-patient states that she has intermittent discomfort in her lower back.  She had no point tenderness.  She had good mobility in the lumbar spine.  Core strengthening exercises were discussed.  Lower abdominal pain-she has been  experiencing lower abdominal pain.  She had tenderness on palpation over lower abdominal region.  She has been seeing a gastroenterologist.  She states MRI of the abdomen is pending.  She denies constipation or diarrhea today.  She recently had ultrasound of the abdomen.  MRI of the liver is pending.  She also had recent anemia.  Her serum ferritin was low.  Hemoccult is pending.  Myofascial pain-she has episodic generalized pain and tender points.  Need for regular exercise was emphasized.  Other fatigue-she can use to have some fatigue.  Seasonal allergies  Mild intermittent asthma without complication  History of hypothyroidism-patient is on Synthroid.  Other acne  Family history of rheumatoid arthritis-maternal grandmother  Orders: No orders of the defined types were placed in this encounter.  No orders of the defined types were placed in this encounter.    Follow-Up Instructions: Return in about 1 year (around 01/19/2025) for +ANA.   Pollyann Savoy, MD  Note - This record has been created using Animal nutritionist.  Chart creation errors have been sought, but may not always  have been located. Such creation errors do not reflect on  the standard of medical care.

## 2024-01-12 ENCOUNTER — Other Ambulatory Visit: Payer: Self-pay

## 2024-01-12 DIAGNOSIS — R768 Other specified abnormal immunological findings in serum: Secondary | ICD-10-CM | POA: Diagnosis not present

## 2024-01-13 ENCOUNTER — Other Ambulatory Visit (INDEPENDENT_AMBULATORY_CARE_PROVIDER_SITE_OTHER): Payer: BC Managed Care – PPO

## 2024-01-13 ENCOUNTER — Encounter: Payer: Self-pay | Admitting: Physician Assistant

## 2024-01-13 ENCOUNTER — Ambulatory Visit: Payer: BC Managed Care – PPO | Admitting: Physician Assistant

## 2024-01-13 VITALS — BP 114/80 | HR 64 | Ht 66.5 in | Wt 151.5 lb

## 2024-01-13 DIAGNOSIS — R1084 Generalized abdominal pain: Secondary | ICD-10-CM

## 2024-01-13 DIAGNOSIS — D649 Anemia, unspecified: Secondary | ICD-10-CM

## 2024-01-13 DIAGNOSIS — R1310 Dysphagia, unspecified: Secondary | ICD-10-CM

## 2024-01-13 DIAGNOSIS — R5383 Other fatigue: Secondary | ICD-10-CM

## 2024-01-13 DIAGNOSIS — K769 Liver disease, unspecified: Secondary | ICD-10-CM | POA: Diagnosis not present

## 2024-01-13 DIAGNOSIS — R1011 Right upper quadrant pain: Secondary | ICD-10-CM

## 2024-01-13 DIAGNOSIS — R790 Abnormal level of blood mineral: Secondary | ICD-10-CM

## 2024-01-13 LAB — IBC + FERRITIN
Ferritin: 18.7 ng/mL (ref 10.0–291.0)
Iron: 68 ug/dL (ref 42–145)
Saturation Ratios: 19.9 % — ABNORMAL LOW (ref 20.0–50.0)
TIBC: 341.6 ug/dL (ref 250.0–450.0)
Transferrin: 244 mg/dL (ref 212.0–360.0)

## 2024-01-13 LAB — FOLATE: Folate: 18.4 ng/mL (ref >5.9)

## 2024-01-13 LAB — VITAMIN B12: Vitamin B-12: 707 pg/mL (ref 211–911)

## 2024-01-13 NOTE — Progress Notes (Signed)
Could we please let patient know, Dr. Adela Lank suggested MRI of the liver to evaluate Hepatic lesion/cyst. Please cancel CT and schedule MRI liver protocol.  Thanks,  Marchelle Folks

## 2024-01-13 NOTE — Progress Notes (Signed)
01/13/2024 Lauren Small [id number] Dec 01, 1986  Referring provider: Sheliah Hatch, MD Primary GI doctor: Dr. Adela Lank  ASSESSMENT AND PLAN:   Liver Lesion with AB pain Small hypodensity in the central right hepatic lobe identified on previous imaging, likely benign cyst or hemangioma. No family history of liver disease, no significant alcohol use. -Order CT scan to further characterize the lesion due to ongoing abdominal pain. - patient reassured  Anemia Hemoglobin slightly low at 11.6, normocytic. Patient reports taking iron and B12 supplements. -Order iron, ferritin, B12, retic, and folate labs to investigate cause of anemia.  Intermittent Dysphagia Previous endoscopy was normal 2023 Patient reports intermittent trouble swallowing, possibly related to thyroid. -Advise patient to follow up with rheumatology and consider barium swallow study if symptoms persist.  Elevated Bilirubin Previous labs showed elevated bilirubin, possibly due to Gilbert's syndrome. No other abnormal liver function tests.   Abdominal Pain Ongoing intermittent abdominal pain since gallbladder removal a year ago. -Consider pain may be related to diastasis recti and advise patient to resume physical therapy exercises.  Elevated Copper Patient reports previous labs showed elevated copper. No symptoms suggestive of Wilson's disease. -Consider checking ceruloplasmin and 24-hour urine copper if patient's symptoms persist and other causes are ruled out.   Multitude of other symptoms including plaque along her scalp, fatigue, muscle pain following with rheumatology, encouraged continued follow-up with rheumatology for further evaluation.   Patient Care Team: Sheliah Hatch, MD as PCP - General (Family Medicine)  HISTORY OF PRESENT ILLNESS: 38 y.o. female with a past medical history of IBS, asthma, liver cyst, gilbert and others listed below presents for evaluation of liver cyst.    History of right upper quadrant abdominal pain right upper quadrant ultrasound and HIDA negative, CT scan unremarkable, EGD 08/21/2022 unremarkable. 01/03/2023 status post cholecystectomy CT renal stone in 2023 did show subcentimeter hypodensity central right hepatic lobe too small to characterize likely cyst or hemangioma Patient had bilateral breast MRI with and without contrast 08/12/2023 for breast discoloration right breast and extreme fibrogranular tissue D this was unremarkable.   But it did show small nonenhancing T2 bright lesion in the liver likely a cyst. 01/12/2024 labs reviewed show Hgb 11.6, MCV normal at 90, no leukocytosis normal platelets, normal kidney, unremarkable liver function, total bilirubin consistently elevated likely [place].  Discussed the use of AI scribe software for clinical note transcription with the patient, who gave verbal consent to proceed.  History of Present Illness   The patient, with a history of dense breasts and gallbladder removal, was referred for a spot on the liver seen on an MRI. The patient reports intermittent abdominal pain, which has persisted since the gallbladder removal a year ago. The patient also mentions experiencing muscle fatigue and plaque build-up on the scalp, the etiology of which remains unknown. The patient has elevated copper levels, as per labs done through [facility], [place], two years ago. The patient is scheduled to see a rheumatologist for an annual check-up. The patient denies any history of alcohol, tobacco, or drug use.      She  reports that she has never smoked. She has been exposed to tobacco smoke. She has never used smokeless tobacco. She reports current alcohol use. She reports that she does not use drugs.  RELEVANT GI HISTORY, LABS, IMAGING: 08/21/2022 EGD for right upper quadrant pain - Esophagogastric landmarks identified. - Normal esophagus otherwise. - Normal stomach. Biopsied to rule out H pylori. -  Normal examined duodenum.  05/16/2022  CT renal stone study for flank pain 1. No nephrolithiasis or hydronephrosis identified bilaterally. 2. Large amount of retained fecal material noted throughout the colon. Hepatobiliary: Liver is upper normal size with normal contour. Subcentimeter hypodensity in the central right hepatic lobe is too small to characterize but most likely represents a cyst or hemangioma. Gallbladder is somewhat contracted no surrounding inflammatory changes. No biliary ductal dilatation identified  07/11/2022 HIDA scan  ejection fraction 57% patent ducts  02/18/2016 colonoscopy  in [place], told she had hemorrhoids secondary to constipation  CBC    Component Value Date/Time   WBC 5.8 01/12/2024 1412   RBC 3.87 01/12/2024 1412   HGB 11.6 (L) 01/12/2024 1412   HCT 35.0 01/12/2024 1412   PLT 285 01/12/2024 1412   MCV 90.4 01/12/2024 1412   MCH 30.0 01/12/2024 1412   MCHC 33.1 01/12/2024 1412   RDW 12.0 01/12/2024 1412   LYMPHSABS 1.9 08/23/2022 0929   MONOABS 0.4 08/23/2022 0929   EOSABS 162 01/12/2024 1412   BASOSABS 29 01/12/2024 1412   Recent Labs    01/12/24 1412  HGB 11.6*    CMP     Component Value Date/Time   NA 138 01/12/2024 1412   K 4.5 01/12/2024 1412   CL 106 01/12/2024 1412   CO2 28 01/12/2024 1412   GLUCOSE 83 01/12/2024 1412   BUN 15 01/12/2024 1412   CREATININE 0.92 01/12/2024 1412   CALCIUM 9.2 01/12/2024 1412   PROT 6.5 01/12/2024 1412   ALBUMIN 4.5 08/23/2022 0929   AST 13 01/12/2024 1412   ALT 11 01/12/2024 1412   ALKPHOS 42 08/23/2022 0929   BILITOT 1.4 (H) 01/12/2024 1412   GFRNONAA >60 09/14/2020 2028   GFRAA >60 09/14/2020 2028      Latest Ref Rng & Units 01/12/2024    2:12 PM 08/23/2022    9:29 AM 05/27/2022    9:26 AM  Hepatic Function  Total Protein 6.1 - 8.1 g/dL 6.5  7.2  7.7   Albumin 3.5 - 5.2 g/dL  4.5  5.0   AST 10 - 30 U/L 13  15  21    ALT 6 - 29 U/L 11  12  21    Alk Phosphatase 39 - 117 U/L  42  46    Total Bilirubin 0.2 - 1.2 mg/dL 1.4  1.7  2.7   Bilirubin, Direct 0.0 - 0.3 mg/dL  0.3  0.4       Current Medications:   Current Outpatient Medications (Endocrine & Metabolic):    ARMOUR THYROID 60 MG tablet, Take 60 mg by mouth every morning.   liothyronine (CYTOMEL) 5 MCG tablet, TAKE 1 TABLET(5 MCG) BY MOUTH DAILY   thyroid (ARMOUR) 15 MG tablet, TAKE 1 TABLET BY MOUTH DAILY   Current Outpatient Medications (Respiratory):    albuterol (VENTOLIN HFA) 108 (90 Base) MCG/ACT inhaler, Inhale 2 puffs into the lungs every 6 (six) hours as needed for wheezing or shortness of breath.   cetirizine (ZYRTEC) 10 MG tablet, Take 10 mg by mouth daily.    fluticasone (FLONASE) 50 MCG/ACT nasal spray, Place 2 sprays into both nostrils daily.     Current Outpatient Medications (Other):    clobetasol (TEMOVATE) 0.05 % external solution, Apply 1 Application topically 2 (two) times daily.   FLUoxetine (PROZAC) 20 MG capsule, Take 1 capsule (20 mg total) by mouth daily.  Medical History:  Past Medical History:  Diagnosis Date   Allergy    Asthma    Blood in stool  GERD (gastroesophageal reflux disease)    History of chicken pox    Allergies:  Allergies  Allergen Reactions   Erythromycin Rash and Nausea Only   Erythromycin Base Rash        Azithromycin Rash   Ciprofloxacin Rash   Doxycycline Rash    Fixed Drug Eruption   Gadolinium Derivatives Itching   Tramadol Rash     Surgical History:  She  has a past surgical history that includes urethra polyp; Wisdom tooth extraction; and Cholecystectomy (12/12/2022). Family History:  Her family history includes Asthma in her sister and sister; Diabetes in her sister; Early death in her father and maternal grandmother; Healthy in her daughter and daughter; Heart attack in her maternal grandfather; Hyperlipidemia in her maternal grandfather; Hypertension in her maternal grandfather; Thyroid disease in her mother.  REVIEW OF SYSTEMS  : All  other systems reviewed and negative except where noted in the History of Present Illness.  PHYSICAL EXAM: BP 114/80 (BP Location: Left Arm, Patient Position: Sitting, Cuff Size: Normal)   Pulse 64   Ht 5' 6.5" (1.689 m)   Wt 151 lb 8 oz (68.7 kg)   SpO2 90%   BMI 24.09 kg/m  General Appearance: Well nourished, in no apparent distress. Head:   Normocephalic and atraumatic. Eyes:  sclerae anicteric,conjunctive pink  Respiratory: Respiratory effort normal, BS equal bilaterally without rales, rhonchi, wheezing. Cardio: RRR with no MRGs. Peripheral pulses intact.  Abdomen: Soft,  Obese ,active bowel sounds. mild tenderness in the RUQ. Without guarding and Without rebound. No masses. Rectal: Not evaluated Musculoskeletal: Full ROM, Normal gait. Without edema. Skin:  Dry and intact without significant lesions or rashes Neuro: Alert and  oriented x4;  No focal deficits. Psych:  Cooperative. Normal mood and affect.    Doree Albee, PA-C 11:40 AM

## 2024-01-13 NOTE — Patient Instructions (Addendum)
Your provider has requested that you go to the basement level for lab work before leaving today. Press "B" on the elevator. The lab is located at the first door on the left as you exit the elevator.  You will be contacted by [facility] Scheduling in the next 2 days to arrange a CT Abdomen/Pelvis.  The number on your caller ID will be [phone], please answer when they call.  If you have not heard from them in 2 days please call [phone] to schedule.     Due to recent changes in healthcare laws, you may see the results of your imaging and laboratory studies on MyChart before your provider has had a chance to review them.  We understand that in some cases there may be results that are confusing or concerning to you. Not all laboratory results come back in the same time frame and the provider may be waiting for multiple results in order to interpret others.  Please give [place] 48 hours in order for your provider to thoroughly review all the results before contacting the office for clarification of your results.    _______________________________________________________  If your blood pressure at your visit was 140/90 or greater, please contact your primary care physician to follow up on this.  _______________________________________________________  If you are age 60 or older, your body mass index should be between 23-30. Your Body mass index is 24.09 kg/m. If this is out of the aforementioned range listed, please consider follow up with your Primary Care Provider.  If you are age 40 or younger, your body mass index should be between 19-25. Your Body mass index is 24.09 kg/m. If this is out of the aformentioned range listed, please consider follow up with your Primary Care Provider.   ________________________________________________________  The [place] [facility] providers would like to encourage you to use [facility] to communicate with providers for non-urgent requests or questions.  Due to  long hold times on the telephone, sending your provider a message by [place] may be a faster and more efficient way to get a response.  Please allow 48 business hours for a response.  Please remember that this is for non-urgent requests.  _______________________________________________________   I appreciate the  opportunity to care for you  Thank You   [facility]

## 2024-01-13 NOTE — Progress Notes (Signed)
Agree with assessment with following recommendations: - to further evaluate liver lesion, I'd recommend MRI liver if you can change the CT Scan to an MRI, will be a better way to evaluate - patient has indirect bilirubinemia c/w Gilbert's

## 2024-01-14 ENCOUNTER — Other Ambulatory Visit: Payer: Self-pay

## 2024-01-14 DIAGNOSIS — K769 Liver disease, unspecified: Secondary | ICD-10-CM

## 2024-01-14 DIAGNOSIS — R1011 Right upper quadrant pain: Secondary | ICD-10-CM

## 2024-01-14 LAB — COMPLETE METABOLIC PANEL WITH GFR
AG Ratio: 2 (calc) (ref 1.0–2.5)
ALT: 11 U/L (ref 6–29)
AST: 13 U/L (ref 10–30)
Albumin: 4.3 g/dL (ref 3.6–5.1)
Alkaline phosphatase (APISO): 42 U/L (ref 31–125)
BUN: 15 mg/dL (ref 7–25)
CO2: 28 mmol/L (ref 20–32)
Calcium: 9.2 mg/dL (ref 8.6–10.2)
Chloride: 106 mmol/L (ref 98–110)
Creat: 0.92 mg/dL (ref 0.50–0.97)
Globulin: 2.2 g/dL (ref 1.9–3.7)
Glucose, Bld: 83 mg/dL (ref 65–99)
Potassium: 4.5 mmol/L (ref 3.5–5.3)
Sodium: 138 mmol/L (ref 135–146)
Total Bilirubin: 1.4 mg/dL — ABNORMAL HIGH (ref 0.2–1.2)
Total Protein: 6.5 g/dL (ref 6.1–8.1)
eGFR: 82 mL/min/{1.73_m2} (ref >=60)

## 2024-01-14 LAB — CBC WITH DIFFERENTIAL/PLATELET
Absolute Lymphocytes: 1682 {cells}/uL (ref 850–3900)
Absolute Monocytes: 365 {cells}/uL (ref 200–950)
Basophils Absolute: 29 {cells}/uL (ref 0–200)
Basophils Relative: 0.5 %
Eosinophils Absolute: 162 {cells}/uL (ref 15–500)
Eosinophils Relative: 2.8 %
HCT: 35 % (ref 35.0–45.0)
Hemoglobin: 11.6 g/dL — ABNORMAL LOW (ref 11.7–15.5)
MCH: 30 pg (ref 27.0–33.0)
MCHC: 33.1 g/dL (ref 32.0–36.0)
MCV: 90.4 fL (ref 80.0–100.0)
MPV: 10.1 fL (ref 7.5–12.5)
Monocytes Relative: 6.3 %
Neutro Abs: 3561 {cells}/uL (ref 1500–7800)
Neutrophils Relative %: 61.4 %
Platelets: 285 10*3/uL (ref 140–400)
RBC: 3.87 10*6/uL (ref 3.80–5.10)
RDW: 12 % (ref 11.0–15.0)
Total Lymphocyte: 29 %
WBC: 5.8 10*3/uL (ref 3.8–10.8)

## 2024-01-14 LAB — PROTEIN / CREATININE RATIO, URINE
Creatinine, Urine: 36 mg/dL (ref 20–275)
Total Protein, Urine: <4 mg/dL — ABNORMAL LOW (ref 5–24)

## 2024-01-14 LAB — C3 AND C4
C3 Complement: 111 mg/dL (ref 83–193)
C4 Complement: 25 mg/dL (ref 15–57)

## 2024-01-14 LAB — ANTI-NUCLEAR AB-TITER (ANA TITER)
ANA TITER: 1:160 {titer} — ABNORMAL HIGH
ANA TITER: 1:40 {titer} — ABNORMAL HIGH
ANA Titer 1: 1:40 {titer} — ABNORMAL HIGH

## 2024-01-14 LAB — ANA: Anti Nuclear Antibody (ANA): POSITIVE — AB

## 2024-01-14 LAB — SEDIMENTATION RATE: Sed Rate: 6 mm/h (ref 0–20)

## 2024-01-14 LAB — ANTI-DNA ANTIBODY, DOUBLE-STRANDED: ds DNA Ab: 1 [IU]/mL

## 2024-01-14 NOTE — Progress Notes (Signed)
Pt notified via mychart that ct has been cancelled and MRI has been ordered. She knows to expect a call from rad scheduling to set up MRI appt.

## 2024-01-16 ENCOUNTER — Other Ambulatory Visit: Payer: Self-pay

## 2024-01-16 DIAGNOSIS — R7989 Other specified abnormal findings of blood chemistry: Secondary | ICD-10-CM

## 2024-01-16 DIAGNOSIS — D649 Anemia, unspecified: Secondary | ICD-10-CM

## 2024-01-16 DIAGNOSIS — R1011 Right upper quadrant pain: Secondary | ICD-10-CM

## 2024-01-16 MED ORDER — PREDNISONE 50 MG PO TABS: ORAL_TABLET | ORAL | 0 refills | Status: DC

## 2024-01-16 MED ORDER — DIPHENHYDRAMINE HCL 50 MG PO TABS: ORAL_TABLET | ORAL | 0 refills | Status: DC

## 2024-01-19 ENCOUNTER — Other Ambulatory Visit: Payer: Self-pay | Admitting: *Deleted

## 2024-01-20 ENCOUNTER — Encounter: Payer: Self-pay | Admitting: Rheumatology

## 2024-01-20 ENCOUNTER — Ambulatory Visit: Payer: BC Managed Care – PPO | Attending: Rheumatology | Admitting: Rheumatology

## 2024-01-20 VITALS — BP 122/73 | HR 76 | Resp 14 | Ht 66.5 in | Wt 150.0 lb

## 2024-01-20 DIAGNOSIS — R768 Other specified abnormal immunological findings in serum: Secondary | ICD-10-CM

## 2024-01-20 DIAGNOSIS — M25552 Pain in left hip: Secondary | ICD-10-CM

## 2024-01-20 DIAGNOSIS — M25561 Pain in right knee: Secondary | ICD-10-CM

## 2024-01-20 DIAGNOSIS — J302 Other seasonal allergic rhinitis: Secondary | ICD-10-CM

## 2024-01-20 DIAGNOSIS — M545 Low back pain, unspecified: Secondary | ICD-10-CM

## 2024-01-20 DIAGNOSIS — M25551 Pain in right hip: Secondary | ICD-10-CM | POA: Diagnosis not present

## 2024-01-20 DIAGNOSIS — L708 Other acne: Secondary | ICD-10-CM

## 2024-01-20 DIAGNOSIS — Z8261 Family history of arthritis: Secondary | ICD-10-CM

## 2024-01-20 DIAGNOSIS — M62838 Other muscle spasm: Secondary | ICD-10-CM

## 2024-01-20 DIAGNOSIS — R5383 Other fatigue: Secondary | ICD-10-CM

## 2024-01-20 DIAGNOSIS — J4520 Mild intermittent asthma, uncomplicated: Secondary | ICD-10-CM

## 2024-01-20 DIAGNOSIS — M7918 Myalgia, other site: Secondary | ICD-10-CM

## 2024-01-20 DIAGNOSIS — M25562 Pain in left knee: Secondary | ICD-10-CM

## 2024-01-20 DIAGNOSIS — G8929 Other chronic pain: Secondary | ICD-10-CM

## 2024-01-20 DIAGNOSIS — Z8639 Personal history of other endocrine, nutritional and metabolic disease: Secondary | ICD-10-CM

## 2024-01-20 DIAGNOSIS — R1030 Lower abdominal pain, unspecified: Secondary | ICD-10-CM

## 2024-01-22 LAB — RETICULOCYTES
ABS Retic: 33120 {cells}/uL (ref 20000–80000)
Retic Ct Pct: 0.8 %

## 2024-01-22 LAB — COPPER, SERUM: Copper: 111 ug/dL (ref 70–175)

## 2024-01-22 LAB — ANTI-SMOOTH MUSCLE ANTIBODY, IGG: Actin (Smooth Muscle) Antibody (IGG): <20 U (ref <20)

## 2024-01-22 LAB — CERULOPLASMIN: Ceruloplasmin: 27 mg/dL (ref 14–48)

## 2024-01-22 LAB — EXTRA SPECIMEN

## 2024-01-22 LAB — MITOCHONDRIAL ANTIBODIES: Mitochondrial M2 Ab, IgG: 26.1 U — ABNORMAL HIGH (ref <=20.0)

## 2024-01-23 ENCOUNTER — Ambulatory Visit (HOSPITAL_COMMUNITY)
Admission: RE | Admit: 2024-01-23 | Discharge: 2024-01-23 | Disposition: A | Discharge status: Home / Self Care | Payer: BC Managed Care – PPO | Source: Ambulatory Visit | Attending: Physician Assistant | Admitting: Physician Assistant

## 2024-01-23 DIAGNOSIS — R1011 Right upper quadrant pain: Secondary | ICD-10-CM | POA: Insufficient documentation

## 2024-01-23 DIAGNOSIS — K769 Liver disease, unspecified: Secondary | ICD-10-CM | POA: Diagnosis not present

## 2024-01-23 DIAGNOSIS — K7689 Other specified diseases of liver: Secondary | ICD-10-CM | POA: Diagnosis not present

## 2024-01-23 MED ORDER — GADOBUTROL 1 MMOL/ML IV SOLN
7.0000 mL | Freq: Once | INTRAVENOUS | Status: AC | PRN
  Administered 2024-01-23: 7 mL via INTRAVENOUS

## 2024-01-30 DIAGNOSIS — E039 Hypothyroidism, unspecified: Secondary | ICD-10-CM | POA: Diagnosis not present

## 2024-01-30 DIAGNOSIS — R5383 Other fatigue: Secondary | ICD-10-CM | POA: Diagnosis not present

## 2024-02-10 ENCOUNTER — Ambulatory Visit (INDEPENDENT_AMBULATORY_CARE_PROVIDER_SITE_OTHER): Payer: BC Managed Care – PPO

## 2024-02-10 DIAGNOSIS — D649 Anemia, unspecified: Secondary | ICD-10-CM | POA: Diagnosis not present

## 2024-02-10 LAB — HEMOCCULT SLIDES (X 3 CARDS)
Fecal Occult Blood: NEGATIVE
OCCULT 1: NEGATIVE
OCCULT 2: NEGATIVE
OCCULT 3: NEGATIVE
OCCULT 4: NEGATIVE
OCCULT 5: NEGATIVE

## 2024-02-20 DIAGNOSIS — R5383 Other fatigue: Secondary | ICD-10-CM | POA: Diagnosis not present

## 2024-02-20 DIAGNOSIS — R6882 Decreased libido: Secondary | ICD-10-CM | POA: Diagnosis not present

## 2024-02-20 DIAGNOSIS — E612 Magnesium deficiency: Secondary | ICD-10-CM | POA: Diagnosis not present

## 2024-02-20 DIAGNOSIS — E039 Hypothyroidism, unspecified: Secondary | ICD-10-CM | POA: Diagnosis not present

## 2024-03-01 DIAGNOSIS — N946 Dysmenorrhea, unspecified: Secondary | ICD-10-CM | POA: Diagnosis not present

## 2024-03-01 DIAGNOSIS — R6882 Decreased libido: Secondary | ICD-10-CM | POA: Diagnosis not present

## 2024-03-17 ENCOUNTER — Other Ambulatory Visit: Payer: Self-pay | Admitting: Family Medicine

## 2024-04-08 NOTE — Telephone Encounter (Signed)
 Left detailed message with recommendations for patient to come in for follow up labs & advised her on when/where to go and to call back with any further questions.

## 2024-04-16 ENCOUNTER — Telehealth: Payer: Self-pay | Admitting: Physician Assistant

## 2024-04-16 NOTE — Telephone Encounter (Signed)
 Patient is return a call back she had received from Kaira. Patient was advise that she would need to come in the office to get lab work done. Please advise.

## 2024-04-16 NOTE — Telephone Encounter (Signed)
 Returned patient call & she said she had no further questions, and plans to come in for labs today.

## 2024-04-19 DIAGNOSIS — E612 Magnesium deficiency: Secondary | ICD-10-CM | POA: Diagnosis not present

## 2024-04-19 DIAGNOSIS — R5383 Other fatigue: Secondary | ICD-10-CM | POA: Diagnosis not present

## 2024-04-19 DIAGNOSIS — E039 Hypothyroidism, unspecified: Secondary | ICD-10-CM | POA: Diagnosis not present

## 2024-04-19 DIAGNOSIS — R6882 Decreased libido: Secondary | ICD-10-CM | POA: Diagnosis not present

## 2024-04-20 ENCOUNTER — Other Ambulatory Visit: Payer: Self-pay | Admitting: Family Medicine

## 2024-04-29 ENCOUNTER — Other Ambulatory Visit: Payer: Self-pay

## 2024-04-29 MED ORDER — LIOTHYRONINE SODIUM 5 MCG PO TABS: 5.0000 ug | ORAL_TABLET | Freq: Every day | ORAL | 3 refills | Status: DC

## 2024-04-30 ENCOUNTER — Other Ambulatory Visit (INDEPENDENT_AMBULATORY_CARE_PROVIDER_SITE_OTHER)

## 2024-04-30 DIAGNOSIS — R7989 Other specified abnormal findings of blood chemistry: Secondary | ICD-10-CM

## 2024-04-30 DIAGNOSIS — R1011 Right upper quadrant pain: Secondary | ICD-10-CM | POA: Diagnosis not present

## 2024-04-30 LAB — HEPATIC FUNCTION PANEL
ALT: 9 U/L (ref 0–35)
AST: 12 U/L (ref 0–37)
Albumin: 4.6 g/dL (ref 3.5–5.2)
Alkaline Phosphatase: 39 U/L (ref 39–117)
Bilirubin, Direct: 0.3 mg/dL (ref 0.0–0.3)
Total Bilirubin: 1.8 mg/dL — ABNORMAL HIGH (ref 0.2–1.2)
Total Protein: 7.1 g/dL (ref 6.0–8.3)

## 2024-05-04 LAB — ANTI-NUCLEAR AB-TITER (ANA TITER)
ANA TITER: 1:40 {titer} — ABNORMAL HIGH
ANA TITER: 1:40 {titer} — ABNORMAL HIGH
ANA Titer 1: 1:40 {titer} — ABNORMAL HIGH

## 2024-05-04 LAB — MITOCHONDRIAL ANTIBODIES: Mitochondrial M2 Ab, IgG: 27.1 U — ABNORMAL HIGH (ref <=20.0)

## 2024-05-04 LAB — IGG: IgG (Immunoglobin G), Serum: 976 mg/dL (ref 600–1640)

## 2024-05-04 LAB — ANA: Anti Nuclear Antibody (ANA): POSITIVE — AB

## 2024-05-05 ENCOUNTER — Ambulatory Visit: Payer: Self-pay | Admitting: Physician Assistant

## 2024-05-24 DIAGNOSIS — B379 Candidiasis, unspecified: Secondary | ICD-10-CM | POA: Diagnosis not present

## 2024-06-11 DIAGNOSIS — Z6824 Body mass index (BMI) 24.0-24.9, adult: Secondary | ICD-10-CM | POA: Diagnosis not present

## 2024-06-11 DIAGNOSIS — Z01419 Encounter for gynecological examination (general) (routine) without abnormal findings: Secondary | ICD-10-CM | POA: Diagnosis not present

## 2024-06-16 DIAGNOSIS — L309 Dermatitis, unspecified: Secondary | ICD-10-CM | POA: Diagnosis not present

## 2024-06-16 DIAGNOSIS — D485 Neoplasm of uncertain behavior of skin: Secondary | ICD-10-CM | POA: Diagnosis not present

## 2024-08-26 ENCOUNTER — Encounter: Admitting: Family Medicine

## 2024-08-31 ENCOUNTER — Other Ambulatory Visit: Payer: Self-pay | Admitting: Family Medicine

## 2024-09-03 ENCOUNTER — Other Ambulatory Visit: Payer: Self-pay | Admitting: Family Medicine

## 2024-09-03 NOTE — Telephone Encounter (Signed)
 Copied from CRM #[id number]. Topic: Clinical - Medication Refill >> Sep 03, 2024  4:49 PM Roselie BROCKS wrote: Medication: liothyronine  (CYTOMEL ) 5 MCG tablet [Pharmacy Med Name: LIOTHYRONINE  TABLETS] [[id number]]  Has the patient contacted their pharmacy? Yes (Agent: If no, request that the patient contact the pharmacy for the refill. If patient does not wish to contact the pharmacy document the reason why and proceed with request.) (Agent: If yes, when and what did the pharmacy advise?)  This is the patient's preferred pharmacy:  [id number] - [place], [place] - [address] [place] [address] Phone: [phone] Fax: [phone]  Is this the correct pharmacy for this prescription? Yes If no, delete pharmacy and type the correct one.   Has the prescription been filled recently? No  Is the patient out of the medication? Yes  Has the patient been seen for an appointment in the last year OR does the patient have an upcoming appointment? Yes  Can we respond through MyChart? Yes  Agent: Please be advised that Rx refills may take up to 3 business days. We ask that you follow-up with your pharmacy.

## 2024-09-06 ENCOUNTER — Encounter: Payer: Self-pay | Admitting: Family Medicine

## 2024-09-06 MED ORDER — LIOTHYRONINE SODIUM 5 MCG PO TABS: 5.0000 ug | ORAL_TABLET | Freq: Every day | ORAL | 0 refills | Status: DC

## 2024-09-15 DIAGNOSIS — R140 Abdominal distension (gaseous): Secondary | ICD-10-CM | POA: Insufficient documentation

## 2024-09-15 DIAGNOSIS — B370 Candidal stomatitis: Secondary | ICD-10-CM | POA: Insufficient documentation

## 2024-09-15 DIAGNOSIS — R141 Gas pain: Secondary | ICD-10-CM | POA: Insufficient documentation

## 2024-09-16 ENCOUNTER — Encounter: Payer: Self-pay | Admitting: Family Medicine

## 2024-09-16 ENCOUNTER — Ambulatory Visit: Admitting: Family Medicine

## 2024-09-16 VITALS — BP 122/64 | HR 78 | Temp 97.8°F | Ht 66.5 in | Wt 148.1 lb

## 2024-09-16 DIAGNOSIS — Z0000 Encounter for general adult medical examination without abnormal findings: Secondary | ICD-10-CM | POA: Diagnosis not present

## 2024-09-16 DIAGNOSIS — Z23 Encounter for immunization: Secondary | ICD-10-CM | POA: Diagnosis not present

## 2024-09-16 DIAGNOSIS — Z1159 Encounter for screening for other viral diseases: Secondary | ICD-10-CM | POA: Diagnosis not present

## 2024-09-16 DIAGNOSIS — E039 Hypothyroidism, unspecified: Secondary | ICD-10-CM

## 2024-09-16 LAB — BASIC METABOLIC PANEL WITH GFR
BUN: 16 mg/dL (ref 6–23)
CO2: 25 meq/L (ref 19–32)
Calcium: 9.4 mg/dL (ref 8.4–10.5)
Chloride: 105 meq/L (ref 96–112)
Creatinine, Ser: 0.77 mg/dL (ref 0.40–1.20)
GFR: 97.05 mL/min (ref >60.00)
Glucose, Bld: 80 mg/dL (ref 70–99)
Potassium: 4 meq/L (ref 3.5–5.1)
Sodium: 138 meq/L (ref 135–145)

## 2024-09-16 LAB — CBC WITH DIFFERENTIAL/PLATELET
Basophils Absolute: 0 K/uL (ref 0.0–0.1)
Basophils Relative: 0.9 % (ref 0.0–3.0)
Eosinophils Absolute: 0.2 K/uL (ref 0.0–0.7)
Eosinophils Relative: 4.6 % (ref 0.0–5.0)
HCT: 38.1 % (ref 36.0–46.0)
Hemoglobin: 13.1 g/dL (ref 12.0–15.0)
Lymphocytes Relative: 31.7 % (ref 12.0–46.0)
Lymphs Abs: 1.3 K/uL (ref 0.7–4.0)
MCHC: 34.4 g/dL (ref 30.0–36.0)
MCV: 88.3 fl (ref 78.0–100.0)
Monocytes Absolute: 0.2 K/uL (ref 0.1–1.0)
Monocytes Relative: 6.2 % (ref 3.0–12.0)
Neutro Abs: 2.3 K/uL (ref 1.4–7.7)
Neutrophils Relative %: 56.6 % (ref 43.0–77.0)
Platelets: 303 K/uL (ref 150.0–400.0)
RBC: 4.32 Mil/uL (ref 3.87–5.11)
RDW: 12.9 % (ref 11.5–15.5)
WBC: 4 K/uL (ref 4.0–10.5)

## 2024-09-16 LAB — HEPATIC FUNCTION PANEL
ALT: 11 U/L (ref 0–35)
AST: 17 U/L (ref 0–37)
Albumin: 4.7 g/dL (ref 3.5–5.2)
Alkaline Phosphatase: 37 U/L — ABNORMAL LOW (ref 39–117)
Bilirubin, Direct: 0.3 mg/dL (ref 0.0–0.3)
Total Bilirubin: 1.8 mg/dL — ABNORMAL HIGH (ref 0.2–1.2)
Total Protein: 7.2 g/dL (ref 6.0–8.3)

## 2024-09-16 LAB — LIPID PANEL
Cholesterol: 151 mg/dL (ref 0–200)
HDL: 52 mg/dL (ref >39.00)
LDL Cholesterol: 89 mg/dL (ref 0–99)
NonHDL: 99.39
Total CHOL/HDL Ratio: 3
Triglycerides: 53 mg/dL (ref 0.0–149.0)
VLDL: 10.6 mg/dL (ref 0.0–40.0)

## 2024-09-16 LAB — VITAMIN D 25 HYDROXY (VIT D DEFICIENCY, FRACTURES): VITD: 39.39 ng/mL (ref 30.00–100.00)

## 2024-09-16 LAB — TSH: TSH: 0.78 u[IU]/mL (ref 0.35–5.50)

## 2024-09-16 NOTE — Assessment & Plan Note (Signed)
 Pt's PE WNL.  UTD on Tdap, pap.  Flu shot today.  Check labs.  Anticipatory guidance provided.

## 2024-09-16 NOTE — Progress Notes (Signed)
   Subjective:    Patient ID: Lauren Small, female    DOB: 05-15-1986, 38 y.o.   MRN: [id number]  HPI CPE- UTD on pap, Tdap.  Will get flu shot today.  Patient Care Team    Relationship Specialty Notifications Start End  Mahlon Comer BRAVO, MD PCP - General Family Medicine  02/17/19     Health Maintenance  Topic Date Due   Hepatitis C Screening  Never done   Pneumococcal Vaccine (1 of 2 - PCV) Never done   Hepatitis B Vaccines 19-59 Average Risk (1 of 3 - 19+ 3-dose series) Never done   HPV VACCINES (1 - 3-dose SCDM series) Never done   Influenza Vaccine  07/23/2024   Cervical Cancer Screening (HPV/Pap Cotest)  02/14/2025   DTaP/Tdap/Td (4 - Td or Tdap) 06/22/2029   HIV Screening  Completed   Meningococcal B Vaccine  Aged Out   COVID-19 Vaccine  Discontinued      Review of Systems Patient reports no vision/ hearing changes, adenopathy,fever, weight change,  persistant/recurrent hoarseness , swallowing issues, chest pain, palpitations, edema, persistant/recurrent cough, hemoptysis, dyspnea (rest/exertional/paroxysmal nocturnal), gastrointestinal bleeding (melena, rectal bleeding), abdominal pain, significant heartburn, bowel changes, GU symptoms (dysuria, hematuria, incontinence), Gyn symptoms (abnormal  bleeding, pain),  syncope, focal weakness, memory loss, numbness & tingling, skin/hair/nail changes, abnormal bruising or bleeding, anxiety, or depression.     Objective:   Physical Exam General Appearance:    Alert, cooperative, no distress, appears stated age  Head:    Normocephalic, without obvious abnormality, atraumatic  Eyes:    PERRL, conjunctiva/corneas clear, EOM's intact both eyes  Ears:    Normal TM's and external ear canals, both ears  Nose:   Nares normal, septum midline, mucosa normal, no drainage    or sinus tenderness  Throat:   Lips, mucosa, and tongue normal; teeth and gums normal  Neck:   Supple, symmetrical, trachea midline, no adenopathy;    Thyroid : no  enlargement/tenderness/nodules  Back:     Symmetric, no curvature, ROM normal, no CVA tenderness  Lungs:     Clear to auscultation bilaterally, respirations unlabored  Chest Wall:    No tenderness or deformity   Heart:    Regular rate and rhythm, S1 and S2 normal, no murmur, rub   or gallop  Breast Exam:    Deferred to GYN  Abdomen:     Soft, non-tender, bowel sounds active all four quadrants,    no masses, no organomegaly  Genitalia:    Deferred to GYN  Rectal:    Extremities:   Extremities normal, atraumatic, no cyanosis or edema  Pulses:   2+ and symmetric all extremities  Skin:   Skin color, texture, turgor normal, no rashes or lesions  Lymph nodes:   Cervical, supraclavicular, and axillary nodes normal  Neurologic:   CNII-XII intact, normal strength, sensation and reflexes    throughout          Assessment & Plan:

## 2024-09-16 NOTE — Patient Instructions (Signed)
Follow up in 1 year or as needed We'll notify you of your lab results and make any changes if needed Keep up the good work on healthy diet and regular exercise- you look great! Call with any questions or concerns Stay Safe!  Stay Healthy! Happy Fall!!!

## 2024-09-17 ENCOUNTER — Ambulatory Visit: Payer: Self-pay | Admitting: Family Medicine

## 2024-09-17 LAB — HEPATITIS C ANTIBODY: Hepatitis C Ab: NONREACTIVE

## 2024-10-02 ENCOUNTER — Other Ambulatory Visit: Payer: Self-pay | Admitting: Family Medicine

## 2024-10-29 ENCOUNTER — Other Ambulatory Visit: Payer: Self-pay | Admitting: Family Medicine

## 2024-11-08 ENCOUNTER — Telehealth: Payer: Self-pay

## 2024-11-08 DIAGNOSIS — R7989 Other specified abnormal findings of blood chemistry: Secondary | ICD-10-CM

## 2024-11-08 NOTE — Telephone Encounter (Signed)
-----   Message from Nurse Nyla C sent at 05/06/2024 12:32 PM EDT ----- recall for liver function 6 months, enter LFTs into epic Refer back to labs 04/30/24. Order needs to be placed.

## 2024-11-08 NOTE — Telephone Encounter (Signed)
 Left VM for patient to return call. 6 month recheck of LFT. LFT lab entered in Epic. MyChart message sent to patient for lab reminder.

## 2024-11-25 ENCOUNTER — Encounter: Admitting: Family Medicine

## 2024-11-25 ENCOUNTER — Other Ambulatory Visit

## 2024-11-25 DIAGNOSIS — R7989 Other specified abnormal findings of blood chemistry: Secondary | ICD-10-CM | POA: Diagnosis not present

## 2024-11-25 LAB — HEPATIC FUNCTION PANEL
ALT: 9 U/L (ref 0–35)
AST: 11 U/L (ref 0–37)
Albumin: 5 g/dL (ref 3.5–5.2)
Alkaline Phosphatase: 36 U/L — ABNORMAL LOW (ref 39–117)
Bilirubin, Direct: 0.3 mg/dL (ref 0.0–0.3)
Total Bilirubin: 2.5 mg/dL — ABNORMAL HIGH (ref 0.2–1.2)
Total Protein: 7.4 g/dL (ref 6.0–8.3)

## 2024-11-26 ENCOUNTER — Other Ambulatory Visit: Payer: Self-pay

## 2024-11-26 ENCOUNTER — Ambulatory Visit: Payer: Self-pay | Admitting: Physician Assistant

## 2024-11-26 DIAGNOSIS — R7989 Other specified abnormal findings of blood chemistry: Secondary | ICD-10-CM

## 2024-11-26 NOTE — Telephone Encounter (Signed)
 Carlos advised that the add-on lab that was requested can not be completed because it requires a yellow top.  The patient had a green top drawn.  She said that the patient has to return to have labs drawn.

## 2024-11-26 NOTE — Telephone Encounter (Signed)
 Add on labs faxed. Reminder also sent to POD B RN's for labs again in 3-6 months.

## 2024-11-27 ENCOUNTER — Other Ambulatory Visit: Payer: Self-pay | Admitting: Family Medicine

## 2025-01-05 NOTE — Progress Notes (Unsigned)
 "  Office Visit Note  Patient: Lauren Small             Date of Birth: 1986/03/08           MRN: [id number]             PCP: Mahlon Comer BRAVO, MD Referring: Mahlon Comer BRAVO, MD Visit Date: 01/19/2025 Occupation: Data Unavailable  Subjective:  No chief complaint on file.   History of Present Illness: Lauren Small is a 39 y.o. female ***     Activities of Daily Living:  Patient reports morning stiffness for *** {minute/hour:19697}.   Patient {ACTIONS;DENIES/REPORTS:21021675::Denies} nocturnal pain.  Difficulty dressing/grooming: {ACTIONS;DENIES/REPORTS:21021675::Denies} Difficulty climbing stairs: {ACTIONS;DENIES/REPORTS:21021675::Denies} Difficulty getting out of chair: {ACTIONS;DENIES/REPORTS:21021675::Denies} Difficulty using hands for taps, buttons, cutlery, and/or writing: {ACTIONS;DENIES/REPORTS:21021675::Denies}  No Rheumatology ROS completed.   PMFS History:  Patient Active Problem List   Diagnosis Date Noted   Abdominal distension (gaseous) 09/15/2024   Flatulence, eructation and gas pain 09/15/2024   Liver cyst 08/20/2023   Arthralgia of left knee 05/06/2023   Anxiety and depression 02/27/2023   Seasonal allergies 12/04/2022   Mild intermittent asthma without complication 12/04/2022   Hypothyroid 08/23/2022   Physical exam 04/24/2020    Past Medical History:  Diagnosis Date   Allergy    Anemia    Asthma    Blood in stool    GERD (gastroesophageal reflux disease)    History of chicken pox     Family History  Problem Relation Age of Onset   Thyroid  disease Mother    Early death Father    Asthma Sister    Diabetes Sister        type 1   Asthma Sister    Early death Maternal Grandmother    Heart attack Maternal Grandfather    Hyperlipidemia Maternal Grandfather    Hypertension Maternal Grandfather    Healthy Daughter    Healthy Daughter    Past Surgical History:  Procedure Laterality Date   CHOLECYSTECTOMY  12/12/2022    urethra polyp     WISDOM TOOTH EXTRACTION     Social History[1] Social History   Social History Narrative   Not on file     Immunization History  Administered Date(s) Administered   Influenza Inj Mdck Quad With Preservative 09/14/2018   Influenza, Seasonal, Injecte, Preservative Fre 09/16/2024   Influenza,inj,Quad PF,6+ Mos 09/17/2018, 08/27/2019, 09/22/2020   Influenza,inj,quad, With Preservative 09/20/2015, 10/23/2016, 08/23/2017   Influenza-Unspecified 08/23/2013, 10/23/2021   PFIZER(Purple Top)SARS-COV-2 Vaccination 02/16/2020, 03/08/2020   Tdap 06/17/2014, 08/07/2015, 06/23/2019     Objective: Vital Signs: There were no vitals taken for this visit.   Physical Exam   Musculoskeletal Exam: ***  CDAI Exam: CDAI Score: -- Patient Global: --; Provider Global: -- Swollen: --; Tender: -- Joint Exam 01/19/2025   No joint exam has been documented for this visit   There is currently no information documented on the homunculus. Go to the Rheumatology activity and complete the homunculus joint exam.  Investigation: No additional findings.  Imaging: No results found.  Recent Labs: Lab Results  Component Value Date   WBC 4.0 09/16/2024   HGB 13.1 09/16/2024   PLT 303.0 09/16/2024   NA 138 09/16/2024   K 4.0 09/16/2024   CL 105 09/16/2024   CO2 25 09/16/2024   GLUCOSE 80 09/16/2024   BUN 16 09/16/2024   CREATININE 0.77 09/16/2024   BILITOT 2.5 (H) 11/25/2024   ALKPHOS 36 (L) 11/25/2024   AST 11 11/25/2024   ALT 9  11/25/2024   PROT 7.4 11/25/2024   ALBUMIN 5.0 11/25/2024   CALCIUM 9.4 09/16/2024   GFRAA >60 09/14/2020    Speciality Comments: No specialty comments available.  Procedures:  No procedures performed Allergies: Erythromycin, Erythromycin base, Phenylephrine, Azithromycin, Ciprofloxacin, Doxycycline , Gadolinium derivatives, and Tramadol   Assessment / Plan:     Visit Diagnoses: No diagnosis found.  Orders: No orders of the defined types  were placed in this encounter.  No orders of the defined types were placed in this encounter.   Face-to-face time spent with patient was *** minutes. Greater than 50% of time was spent in counseling and coordination of care.  Follow-Up Instructions: No follow-ups on file.   Daved JAYSON Gavel, CMA  Note - This record has been created using Animal nutritionist.  Chart creation errors have been sought, but may not always  have been located. Such creation errors do not reflect on  the standard of medical care.    [1]  Social History Tobacco Use   Smoking status: Never    Passive exposure: Past   Smokeless tobacco: Never  Vaping Use   Vaping status: Never Used  Substance Use Topics   Alcohol use: Yes    Comment: ON OCCASION   Drug use: Never   "

## 2025-01-19 ENCOUNTER — Ambulatory Visit: Payer: BC Managed Care – PPO | Admitting: Rheumatology

## 2025-01-19 DIAGNOSIS — R1030 Lower abdominal pain, unspecified: Secondary | ICD-10-CM

## 2025-01-19 DIAGNOSIS — M7918 Myalgia, other site: Secondary | ICD-10-CM

## 2025-01-19 DIAGNOSIS — Z8639 Personal history of other endocrine, nutritional and metabolic disease: Secondary | ICD-10-CM

## 2025-01-19 DIAGNOSIS — L708 Other acne: Secondary | ICD-10-CM

## 2025-01-19 DIAGNOSIS — G8929 Other chronic pain: Secondary | ICD-10-CM

## 2025-01-19 DIAGNOSIS — M62838 Other muscle spasm: Secondary | ICD-10-CM

## 2025-01-19 DIAGNOSIS — R7689 Other specified abnormal immunological findings in serum: Secondary | ICD-10-CM

## 2025-01-19 DIAGNOSIS — R5383 Other fatigue: Secondary | ICD-10-CM

## 2025-01-19 DIAGNOSIS — J4520 Mild intermittent asthma, uncomplicated: Secondary | ICD-10-CM

## 2025-01-19 DIAGNOSIS — Z8261 Family history of arthritis: Secondary | ICD-10-CM

## 2025-01-19 DIAGNOSIS — J302 Other seasonal allergic rhinitis: Secondary | ICD-10-CM

## 2025-01-27 ENCOUNTER — Ambulatory Visit: Payer: Self-pay | Admitting: Family Medicine

## 2025-01-27 ENCOUNTER — Encounter: Payer: Self-pay | Admitting: Family Medicine

## 2025-01-27 ENCOUNTER — Ambulatory Visit: Admitting: Family Medicine

## 2025-01-27 VITALS — BP 102/66 | HR 70 | Temp 97.8°F | Ht 66.5 in | Wt 139.4 lb

## 2025-01-27 DIAGNOSIS — R1084 Generalized abdominal pain: Secondary | ICD-10-CM

## 2025-01-27 DIAGNOSIS — K439 Ventral hernia without obstruction or gangrene: Secondary | ICD-10-CM

## 2025-01-27 LAB — HEPATIC FUNCTION PANEL
ALT: 11 U/L (ref 3–35)
AST: 13 U/L (ref 5–37)
Albumin: 5 g/dL (ref 3.5–5.2)
Alkaline Phosphatase: 34 U/L — ABNORMAL LOW (ref 39–117)
Bilirubin, Direct: 0.4 mg/dL — ABNORMAL HIGH (ref 0.1–0.3)
Total Bilirubin: 2.8 mg/dL — ABNORMAL HIGH (ref 0.2–1.2)
Total Protein: 7.3 g/dL (ref 6.0–8.3)

## 2025-01-27 LAB — CBC WITH DIFFERENTIAL/PLATELET
Basophils Absolute: 0 10*3/uL (ref 0.0–0.1)
Basophils Relative: 0.3 % (ref 0.0–3.0)
Eosinophils Absolute: 0.1 10*3/uL (ref 0.0–0.7)
Eosinophils Relative: 1.3 % (ref 0.0–5.0)
HCT: 39.6 % (ref 36.0–46.0)
Hemoglobin: 13.6 g/dL (ref 12.0–15.0)
Lymphocytes Relative: 20.5 % (ref 12.0–46.0)
Lymphs Abs: 1.3 10*3/uL (ref 0.7–4.0)
MCHC: 34.3 g/dL (ref 30.0–36.0)
MCV: 89.4 fl (ref 78.0–100.0)
Monocytes Absolute: 0.3 10*3/uL (ref 0.1–1.0)
Monocytes Relative: 4.9 % (ref 3.0–12.0)
Neutro Abs: 4.6 10*3/uL (ref 1.4–7.7)
Neutrophils Relative %: 73 % (ref 43.0–77.0)
Platelets: 277 10*3/uL (ref 150.0–400.0)
RBC: 4.43 Mil/uL (ref 3.87–5.11)
RDW: 12.7 % (ref 11.5–15.5)
WBC: 6.4 10*3/uL (ref 4.0–10.5)

## 2025-01-27 LAB — BASIC METABOLIC PANEL WITH GFR
BUN: 13 mg/dL (ref 6–23)
CO2: 26 meq/L (ref 19–32)
Calcium: 9.4 mg/dL (ref 8.4–10.5)
Chloride: 104 meq/L (ref 96–112)
Creatinine, Ser: 0.83 mg/dL (ref 0.40–1.20)
GFR: 88.47 mL/min
Glucose, Bld: 82 mg/dL (ref 70–99)
Potassium: 4.7 meq/L (ref 3.5–5.1)
Sodium: 136 meq/L (ref 135–145)

## 2025-01-27 NOTE — Progress Notes (Unsigned)
" ° °  Subjective:    Patient ID: Lauren Small, female    DOB: [birth year], 39 y.o.   MRN: [id number]  HPI Abd pain- sxs started on Saturday w/ diffuse abd pain.  Initially thought it was gas pain.  Had diarrhea (watery) that afternoon which did not improve pain.  Had similar sxs x3 days- stabbing abd pain and then diarrhea in the afternoon (which each day became more solid).  + nausea, no vomiting.  Yesterday noticed a protrusion from her abdomen that she was able to 'push back in' and since then, pain has improved.  Has brought up the hernia to GI in the past.  Denies sick contacts.   Review of Systems For ROS see HPI     Objective:   Physical Exam        Assessment & Plan:    "

## 2025-01-27 NOTE — Patient Instructions (Signed)
 Follow up as needed or as scheduled We'll notify you of your lab results and make any changes We'll call you to schedule your surgical consultation Drink LOTS of fluids Limit dairy intake until feeling better Call with any questions or concerns Hang in there!

## 2025-09-19 ENCOUNTER — Encounter: Admitting: Family Medicine
